# Patient Record
Sex: Female | Born: 1950 | Hispanic: No | Smoking: Never smoker
Health system: Southern US, Community
[De-identification: ages and names within clinical notes are randomized; demographics above are authoritative.]

## PROBLEM LIST (undated history)

## (undated) DIAGNOSIS — K219 Gastro-esophageal reflux disease without esophagitis: Secondary | ICD-10-CM

## (undated) DIAGNOSIS — R0600 Dyspnea, unspecified: Secondary | ICD-10-CM

## (undated) DIAGNOSIS — J40 Bronchitis, not specified as acute or chronic: Secondary | ICD-10-CM

## (undated) DIAGNOSIS — I10 Essential (primary) hypertension: Secondary | ICD-10-CM

## (undated) DIAGNOSIS — M1990 Unspecified osteoarthritis, unspecified site: Secondary | ICD-10-CM

## (undated) DIAGNOSIS — E119 Type 2 diabetes mellitus without complications: Secondary | ICD-10-CM

## (undated) DIAGNOSIS — R109 Unspecified abdominal pain: Secondary | ICD-10-CM

## (undated) DIAGNOSIS — E785 Hyperlipidemia, unspecified: Secondary | ICD-10-CM

## (undated) HISTORY — DX: Bronchitis, not specified as acute or chronic: J40

## (undated) HISTORY — DX: Unspecified osteoarthritis, unspecified site: M19.90

## (undated) HISTORY — DX: Unspecified abdominal pain: R10.9

## (undated) HISTORY — DX: Type 2 diabetes mellitus without complications: E11.9

## (undated) HISTORY — DX: Essential (primary) hypertension: I10

## (undated) HISTORY — DX: Hyperlipidemia, unspecified: E78.5

## (undated) HISTORY — PX: COLONOSCOPY: SHX174

## (undated) SURGERY — Surgical Case
Anesthesia: *Unknown

---

## 2015-09-13 ENCOUNTER — Other Ambulatory Visit (INDEPENDENT_AMBULATORY_CARE_PROVIDER_SITE_OTHER): Payer: Self-pay

## 2015-09-13 DIAGNOSIS — E785 Hyperlipidemia, unspecified: Secondary | ICD-10-CM

## 2015-09-13 DIAGNOSIS — I10 Essential (primary) hypertension: Secondary | ICD-10-CM

## 2015-09-14 LAB — COMPREHENSIVE METABOLIC PANEL
A/G RATIO: 1.4 (ref 1.1–2.5)
ALBUMIN: 4.3 g/dL (ref 3.6–4.8)
ALT: 9 IU/L (ref 0–32)
AST: 18 IU/L (ref 0–40)
Alkaline Phosphatase: 100 IU/L (ref 39–117)
BILIRUBIN TOTAL: 0.3 mg/dL (ref 0.0–1.2)
BUN / CREAT RATIO: 15 (ref 11–26)
BUN: 9 mg/dL (ref 8–27)
CALCIUM: 9.6 mg/dL (ref 8.7–10.3)
CO2: 27 mmol/L (ref 18–29)
Chloride: 97 mmol/L (ref 97–106)
Creatinine, Ser: 0.62 mg/dL (ref 0.57–1.00)
GFR, EST AFRICAN AMERICAN: 112 mL/min/{1.73_m2} (ref >59)
GFR, EST NON AFRICAN AMERICAN: 97 mL/min/{1.73_m2} (ref >59)
GLUCOSE: 124 mg/dL — AB (ref 65–99)
Globulin, Total: 3.1 g/dL (ref 1.5–4.5)
Potassium: 4.2 mmol/L (ref 3.5–5.2)
Sodium: 141 mmol/L (ref 136–144)
TOTAL PROTEIN: 7.4 g/dL (ref 6.0–8.5)

## 2015-09-14 LAB — LIPID PANEL W/O CHOL/HDL RATIO
Cholesterol, Total: 205 mg/dL — ABNORMAL HIGH (ref 100–199)
HDL: 64 mg/dL (ref >39)
LDL CALC: 128 mg/dL — AB (ref 0–99)
Triglycerides: 66 mg/dL (ref 0–149)
VLDL CHOLESTEROL CAL: 13 mg/dL (ref 5–40)

## 2015-09-30 ENCOUNTER — Ambulatory Visit (INDEPENDENT_AMBULATORY_CARE_PROVIDER_SITE_OTHER): Payer: Self-pay | Admitting: Internal Medicine

## 2015-09-30 ENCOUNTER — Encounter: Payer: Self-pay | Admitting: Internal Medicine

## 2015-09-30 VITALS — BP 110/68 | HR 60 | Resp 16 | Ht 68.0 in | Wt 153.0 lb

## 2015-09-30 DIAGNOSIS — S99921A Unspecified injury of right foot, initial encounter: Secondary | ICD-10-CM

## 2015-09-30 DIAGNOSIS — L810 Postinflammatory hyperpigmentation: Secondary | ICD-10-CM

## 2015-09-30 DIAGNOSIS — I10 Essential (primary) hypertension: Secondary | ICD-10-CM

## 2015-09-30 DIAGNOSIS — E119 Type 2 diabetes mellitus without complications: Secondary | ICD-10-CM

## 2015-09-30 DIAGNOSIS — E1169 Type 2 diabetes mellitus with other specified complication: Secondary | ICD-10-CM | POA: Insufficient documentation

## 2015-09-30 DIAGNOSIS — M25512 Pain in left shoulder: Secondary | ICD-10-CM

## 2015-09-30 DIAGNOSIS — E785 Hyperlipidemia, unspecified: Secondary | ICD-10-CM | POA: Insufficient documentation

## 2015-09-30 HISTORY — DX: Hyperlipidemia, unspecified: E78.5

## 2015-09-30 MED ORDER — AMLODIPINE BESYLATE 10 MG PO TABS: 10.0000 mg | ORAL_TABLET | Freq: Every day | ORAL | Status: DC

## 2015-09-30 MED ORDER — NAPROXEN SODIUM 220 MG PO TABS: ORAL_TABLET | ORAL | Status: DC

## 2015-09-30 MED ORDER — GLIMEPIRIDE 2 MG PO TABS: 2.0000 mg | ORAL_TABLET | Freq: Every day | ORAL | Status: DC

## 2015-09-30 MED ORDER — METFORMIN HCL 500 MG PO TABS: 500.0000 mg | ORAL_TABLET | Freq: Two times a day (BID) | ORAL | Status: DC

## 2015-09-30 MED ORDER — SIMVASTATIN 20 MG PO TABS: 20.0000 mg | ORAL_TABLET | Freq: Every day | ORAL | Status: DC

## 2015-09-30 NOTE — Patient Instructions (Signed)
Drink a glass of water before every meal Drink 6-8 glasses of water daily Eat three meals daily Eat a protein and healthy fat with every meal (eggs,fish, chicken, [place] and limit red meats) Eat 5 servings of vegetables daily, mix the colors Eat 2 servings of fruit daily with skin, if skin is edible Use smaller plates Put food/utensils down as you chew and swallow each bite Eat at a table with friends/family at least once daily, no TV Do not eat in front of the TV  Eggs, nuts, fish, chicken, [place] are good protein/fat combinations--just don't fry them.  Eat only one handful of nuts daily as they have a lot of calories  Stay away from red meats  Call for appt. When you return from [place]

## 2015-09-30 NOTE — Progress Notes (Signed)
Subjective:    Patient ID: Cheryl Hopkins, female    DOB: August 10, 1951, 64 y.o.   MRN: [id number]  HPI   1.  DM:  A1C in August/September was down to 7.1%.  Brings in sugars from beginning of October ranging from 51 before dinner to 177 before breakfast.  Majority in the 80s before breakfast, however.  The following week ranging fro 119-196 with last check 10/17.  Pt. States a visiting child dumped her test strips into the garbage and could not continue to check after mid October.  The family did not go back for a refill even in November.  2.  Hyperlipidemia:  Not at goal with total and LDL at 205 and 128, respectively, but much improved with Simvastatin start.  Discussed continuing to improve diet and physical activity  3.  Left shoulder pain for past week.  Reportedly has had in past and had had injected with good results.  Has taken Aleve 2 tabs once daily for this with good control of pain.  Hurts most with abduction of shoulder.  Denies recent injury or overuse.  4.  Stubbed right 4th toe last week.  Toe and distal foot are swollen and painful.    5.  Hyperpigmentation in skin below eyes.  Pt. Would like a cream or treatment to decrease hyperpigmentation.  Reportedly, had some swelling below eyes when first came to [place] of year that has since resolved.  Pt. Jumps from one topic to another today.  Difficulty getting focus. Pt. Has appt. Later today with dentist and an appt. In two days with Ophthalmology for diabetic eye exam. They have not yet heard about ENT for loss of hearing in left ear or CXR for bilateral dry crackles.    Review of Systems     Objective:   Physical Exam  NAD Skin on face with hypopigmentation of lower lids, with skin just below extending lateral to zygomatic arch to mid cheek with hyperpigmentation and mild deepening of skin folds.  Perinasolabial fold areas bilaterally with slight hypopigmentation as well.  No current swelling or rash.  Left  shoulder:  Full ROM, but significant discomfort to abduct from 90 degrees to 180 degrees. Passive ROM is much less painful.  Rest of ROM appears much less painful.  Tender over left trapezius to shoulder.  NT over subacromial bursa, AC, and CC joints.  Good hand grip bilaterally.  Right foot:  4th toe with swelling and tenderness.  No crepitation.  Tender over dorsal distal foot at base of toes 3-5 with some swelling here as well.        Assessment & Plan:  1.  DM:  Did not keep track of sugars as noted, but has shown much improved control.  2.  HYperlipidemia:  Improved with start of Simvastatin.  Leaving the country for [place] in weeks.  Will not be able to have follow up if increase Simvastatin now.  To work on diet and exercise as discussed.  3.  Left shoulder pain:  Not clear if this is a rotator cuff tendinitis or not.  To utilize Aleve once daily with food for next 2 weeks and then only as needed thereafter.  4.  Right 4th toe injury:  May be fractured.  Toe buddy taped to 3rd toe and to told to wear flat, firm soled shoe that is not open backed to prevent sliding of foot back and forth.  Elevate.  5.  Pigmentation changes of facial skin:  Asked  her to utilize Eucerin Cream to keep hydrated, also to apply 30 spf sunscreen when outdoors and avoid direct exposure to sun.  Discussed likely due to post inflammatory pigmentation changes and should improve with time.  36. Son brings up she will be leaving for [place] soon.  To pick up 4 months of prescriptions before leaving and will check into need for malaria prophylaxis and immunizations recommended for someone who is traveling back and forth, but has lived most of life in [place].

## 2015-10-07 ENCOUNTER — Telehealth: Payer: Self-pay | Admitting: Internal Medicine

## 2015-10-07 DIAGNOSIS — E119 Type 2 diabetes mellitus without complications: Secondary | ICD-10-CM

## 2015-10-07 MED ORDER — METFORMIN HCL ER 500 MG PO TB24: ORAL_TABLET | ORAL | Status: DC

## 2015-10-07 NOTE — Telephone Encounter (Signed)
Patient's son Horald Chestnut) [phone].  Caller was asked to notify physician by pharmacist to see if patient could take extended release tabs of metFORMIN (GLUCOPHAGE) 500 mg. Tab.  Patient complained that the Rx makes her stomach hurt.  Also patient's son stated that mother has not received Rx for cholesterol and blood pressure med.  Please call patients son and advise.  Pharmacy is [facility] on [address].

## 2015-10-07 NOTE — Telephone Encounter (Signed)
rx done son aware

## 2015-10-23 ENCOUNTER — Telehealth: Payer: Self-pay

## 2015-10-23 NOTE — Telephone Encounter (Signed)
Patient son called on yesterday 10/22/15 to check to see if Chest X-ray results were back. Results given to Dr. Amil Amen

## 2015-10-23 NOTE — Telephone Encounter (Signed)
Called Ismael, pt's son:

## 2015-10-23 NOTE — Telephone Encounter (Signed)
Called Ismael, pt's son.  Difficulty understanding him over phone with lots of background noise.  Discussed his mother's CXR likely collapsed lung(atelectasis) or changes from bronchiectasis, which sounds like what she suffered from in past years based on description of pulmonary toilet that caused her chest wall to hurt in past.   I also discussed malaria prophylaxis and need to know what her past immunizations to know what she needs before returning to [place]:  Meningococcus, Typhoid, Yellow Fever, Hepatitis A.  Set up for appt. Next Wednesday evening and he will make sure they bring her immunizations.

## 2015-10-30 ENCOUNTER — Ambulatory Visit (INDEPENDENT_AMBULATORY_CARE_PROVIDER_SITE_OTHER): Payer: Self-pay | Admitting: Internal Medicine

## 2015-10-30 ENCOUNTER — Encounter: Payer: Self-pay | Admitting: Internal Medicine

## 2015-10-30 VITALS — BP 120/68 | HR 73 | Ht 68.0 in | Wt 153.5 lb

## 2015-10-30 DIAGNOSIS — J984 Other disorders of lung: Secondary | ICD-10-CM

## 2015-10-30 DIAGNOSIS — Z7184 Encounter for health counseling related to travel: Secondary | ICD-10-CM

## 2015-10-30 DIAGNOSIS — Z7189 Other specified counseling: Secondary | ICD-10-CM

## 2015-10-30 DIAGNOSIS — I10 Essential (primary) hypertension: Secondary | ICD-10-CM

## 2015-10-30 DIAGNOSIS — J9801 Acute bronchospasm: Secondary | ICD-10-CM

## 2015-10-30 MED ORDER — ALBUTEROL SULFATE HFA 108 (90 BASE) MCG/ACT IN AERS
2.0000 | INHALATION_SPRAY | Freq: Four times a day (QID) | RESPIRATORY_TRACT | Status: DC | PRN
Start: 2015-10-30 — End: 2016-11-05

## 2015-10-30 NOTE — Patient Instructions (Signed)
Recommended vaccines  Hepatitis A:  2 shots 6 months apart.  May be less expensive to get in combination with Hepatitis B (Twinrix)  Check the public health department in [place] to see if free.  Typhoid--not required, but recommended.  $78 at the [place] [facility]  Malarone ( Atovaquone/Proguanil  250 mg/100 mg once daily for malaria prophylaxis.  Start 1-2 days before getting into [place] and every daty there.  Continue for 7 days after returning from [place].  You are good with Meningitis/Yellow Fever/Tetanus/MMR vaccines.    Would recommend one more Varicella vaccine. 

## 2015-10-30 NOTE — Progress Notes (Signed)
   Subjective:    Patient ID: Monia Sabal, female    DOB: 23-Dec-1950, 64 y.o.   MRN: [id number]  HPI  1.  Abnormal CXR:  Recent left lower lung infiltrate consistent with atelectasis.  Recommended to have follow up for resolution.   Pt. Had asthma when a child with chronic coughing.  2 years ago, suffered from pneumonia and sounds like developed a chronic process requiring aggressive pulmonary toilet.  Stopped going for the pulmonary toilet as she heard a "crack"  And developed chest discomfort.  Sounds like had a cough for over a month productive of thick sputum.   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  2.  Travel Immunization and Malaria prophylaxis review to return to [place] for 6 months.  They do have mosquito netting for sleep.  Never used Malaria prophylaxis when lived in [place], [place].      Review of Systems     Objective:   Physical Exam  NAD Lungs:  Bibasilar posterior dry crackles at bases.  Resonant to percussion over same area.  No wheeze CV:  RRR without murmur or rub.        Assessment & Plan:  1.  Left lower lung infiltrate consistent with atelectasis and findings of likely chronic bronchitis/airway inflammation. Pt. Leaving for 6 months to [place].  She is exposed to air pollution in [place].  Has a mask she wears after sandstorms weekly.   Rx for Albuterol HFA to use as needed.   Her daughter is a pediatrician and can have her reassessed while in [place] if she feels chest tightness and wheezing.  Consider addition of inhaled corticosteroids if they find she is wheezing and needing Albuterol more than once weekly. When returns to [place]. In 6 months, recommend making appt. To repeat CXR.  2.  Travel immunizations and prophylaxis:  After review of her immunizations, recommend the following with PHD or once gets to [place]:  Environmental education officer and netting for sleep.  PHD recommended Malarone for malaria prophylaxis.   Discussed with pt and son $7/pill.  She should start it 1-2 days before arrival in [place], continue daily while there and continue for 7 days after return to [place]. Hepatitis A vaccine  $138 per shot (Twinrix here)  Can check with PHD in [place]  [employer] to see if still no charge there. Would ultimately complete Hep B vaccination over next year if gets started with Twinrix.   Typhoid $78 at [facility] here. She has been immunized for Meningococcemia, Yellow Fever, Td, Varicella (though recommend a second immunization when get to [place]), MMR

## 2015-10-31 DIAGNOSIS — I152 Hypertension secondary to endocrine disorders: Secondary | ICD-10-CM | POA: Insufficient documentation

## 2015-10-31 DIAGNOSIS — J984 Other disorders of lung: Secondary | ICD-10-CM | POA: Insufficient documentation

## 2015-10-31 DIAGNOSIS — I10 Essential (primary) hypertension: Secondary | ICD-10-CM | POA: Insufficient documentation

## 2016-08-25 ENCOUNTER — Other Ambulatory Visit: Payer: Self-pay | Admitting: Internal Medicine

## 2016-08-25 DIAGNOSIS — E119 Type 2 diabetes mellitus without complications: Secondary | ICD-10-CM

## 2016-09-08 ENCOUNTER — Other Ambulatory Visit: Payer: Self-pay | Admitting: Internal Medicine

## 2016-09-08 DIAGNOSIS — E782 Mixed hyperlipidemia: Secondary | ICD-10-CM

## 2016-09-08 MED ORDER — SIMVASTATIN 20 MG PO TABS: 20.0000 mg | ORAL_TABLET | Freq: Every day | ORAL | 1 refills | Status: DC

## 2016-09-09 NOTE — Telephone Encounter (Signed)
Patient's family informed.  Apt. On Jan. 3 at 6:00 p.

## 2016-11-04 ENCOUNTER — Telehealth: Payer: Self-pay | Admitting: Internal Medicine

## 2016-11-04 ENCOUNTER — Ambulatory Visit: Payer: Self-pay | Admitting: Internal Medicine

## 2016-11-05 ENCOUNTER — Ambulatory Visit (INDEPENDENT_AMBULATORY_CARE_PROVIDER_SITE_OTHER): Payer: BLUE CROSS/BLUE SHIELD | Admitting: Physician Assistant

## 2016-11-05 VITALS — BP 118/80 | HR 72 | Temp 98.6°F | Resp 18 | Ht 68.0 in | Wt 162.4 lb

## 2016-11-05 DIAGNOSIS — Z01419 Encounter for gynecological examination (general) (routine) without abnormal findings: Secondary | ICD-10-CM | POA: Diagnosis not present

## 2016-11-05 DIAGNOSIS — G8929 Other chronic pain: Secondary | ICD-10-CM

## 2016-11-05 DIAGNOSIS — E119 Type 2 diabetes mellitus without complications: Secondary | ICD-10-CM

## 2016-11-05 DIAGNOSIS — Z1231 Encounter for screening mammogram for malignant neoplasm of breast: Secondary | ICD-10-CM | POA: Diagnosis not present

## 2016-11-05 DIAGNOSIS — M25562 Pain in left knee: Secondary | ICD-10-CM

## 2016-11-05 DIAGNOSIS — Z114 Encounter for screening for human immunodeficiency virus [HIV]: Secondary | ICD-10-CM | POA: Diagnosis not present

## 2016-11-05 DIAGNOSIS — Z0000 Encounter for general adult medical examination without abnormal findings: Secondary | ICD-10-CM | POA: Diagnosis not present

## 2016-11-05 DIAGNOSIS — Z1329 Encounter for screening for other suspected endocrine disorder: Secondary | ICD-10-CM | POA: Diagnosis not present

## 2016-11-05 DIAGNOSIS — I10 Essential (primary) hypertension: Secondary | ICD-10-CM | POA: Diagnosis not present

## 2016-11-05 DIAGNOSIS — Z1159 Encounter for screening for other viral diseases: Secondary | ICD-10-CM

## 2016-11-05 DIAGNOSIS — E7800 Pure hypercholesterolemia, unspecified: Secondary | ICD-10-CM | POA: Diagnosis not present

## 2016-11-05 DIAGNOSIS — Z23 Encounter for immunization: Secondary | ICD-10-CM

## 2016-11-05 DIAGNOSIS — M25561 Pain in right knee: Secondary | ICD-10-CM | POA: Diagnosis not present

## 2016-11-05 DIAGNOSIS — Z1211 Encounter for screening for malignant neoplasm of colon: Secondary | ICD-10-CM | POA: Diagnosis not present

## 2016-11-05 MED ORDER — ZOSTER VACCINE LIVE 19400 UNT/0.65ML ~~LOC~~ SUSR: 0.6500 mL | Freq: Once | SUBCUTANEOUS | 0 refills | Status: AC

## 2016-11-05 MED ORDER — AMLODIPINE BESYLATE 10 MG PO TABS: 10.0000 mg | ORAL_TABLET | Freq: Every day | ORAL | 3 refills | Status: DC

## 2016-11-05 MED ORDER — MELOXICAM 7.5 MG PO TABS: 7.5000 mg | ORAL_TABLET | Freq: Every day | ORAL | 0 refills | Status: DC

## 2016-11-05 NOTE — Progress Notes (Signed)
Cheryl Hopkins  MRN: [id number] DOB: Jan 05, 1951  Subjective:  Pt presents to clinic for a CPE.  She has not had medical care for over a year.  She has been taking her medications for her chronic medical problems but is going to be out in a few weeks.  She would like to change her care to here.   Last dental exam:  Every 6 months  Last vision exam: last year Last pap smear: long time ago Last mammogram: long time ago Last colonoscopy: never Vaccinations - UTD  Exercise: no Diet: tries to eat healthly  Son interprets per patient request   Patient Active Problem List   Diagnosis Date Noted  . Chronic lung disease 10/31/2015  . Hypertension 10/31/2015  . Diabetes mellitus type II, controlled ([place]) 09/30/2015  . Hyperlipidemia 09/30/2015    Current Outpatient Prescriptions on File Prior to Visit  Medication Sig Dispense Refill  . amLODipine (NORVASC) 10 MG tablet Take 1 tablet (10 mg total) by mouth daily. 30 tablet 11  . glimepiride (AMARYL) 2 MG tablet TAKE ONE TABLET BY MOUTH ONCE DAILY WITH  BREAKFAST 60 tablet 1  . metFORMIN (GLUCOPHAGE-XR) 500 MG 24 hr tablet TAKE ONE TABLET BY MOUTH TWICE DAILY WITH MEALS 60 tablet 1  . simvastatin (ZOCOR) 20 MG tablet Take 1 tablet (20 mg total) by mouth daily. Take with evening meal 30 tablet 1  . naproxen sodium (ALEVE) 220 MG tablet 2 tabs once daily with meal for 2 weeks, then once daily with meal as needed for pain     No current facility-administered medications on file prior to visit.     No Known Allergies  Social History   Social History  . Marital status: Widowed    Spouse name: Widowed since 2010  . Number of children: 74  . Years of education: <8   Occupational History  . Housewife    Social History Main Topics  . Smoking status: Never Smoker  . Smokeless tobacco: Never Used     Comment: Chews cola nut  . Alcohol use No  . Drug use: No  . Sexual activity: Not Currently   Other Topics Concern  . Not on file    Social History Narrative      Widow   Originally from [place] - Came to [place]. In 2016   Lives with son and his wife and their 3 children.        Exercise - no exercise      Seatbelt - 100%   Gun in home - no    No past surgical history on file.  Family History  Problem Relation Age of Onset  . Bronchitis Sister   . Hepatitis Daughter   . Hypertension Sister   . Stroke Sister     Review of Systems  Constitutional: Negative.   HENT: Negative.   Eyes: Negative.   Respiratory: Negative.   Cardiovascular: Negative.   Gastrointestinal: Negative.   Endocrine: Negative.   Genitourinary: Negative.   Musculoskeletal: Negative.        Knees hurt - relafen helps - back of knees feel tight  Skin: Negative.   Allergic/Immunologic: Negative.   Neurological: Negative.   Hematological: Negative.   Psychiatric/Behavioral: Negative.     Objective:  BP 118/80   Pulse 72   Temp 98.6 F (37 C) (Oral)   Resp 18   Ht _0  (1.727 m)   Wt 162 lb 6.4 oz (73.7 kg)   LMP 11/02/2010 (Approximate)  Comment: postmenopausal  SpO2 96%   BMI 24.69 kg/m   Physical Exam  Constitutional: She is oriented to person, place, and time and well-developed, well-nourished, and in no distress.  HENT:  Head: Normocephalic and atraumatic.  Right Ear: Hearing, tympanic membrane, external ear and ear canal normal.  Left Ear: Hearing, tympanic membrane, external ear and ear canal normal.  Nose: Nose normal.  Mouth/Throat: Uvula is midline, oropharynx is clear and moist and mucous membranes are normal.  Eyes: Conjunctivae and EOM are normal. Pupils are equal, round, and reactive to light.  Neck: Trachea normal and normal range of motion. Neck supple. No thyroid mass and no thyromegaly present.  Cardiovascular: Normal rate, regular rhythm and normal heart sounds.   No murmur heard. Pulmonary/Chest: Effort normal and breath sounds normal. She has no wheezes. Right breast exhibits no inverted nipple, no  mass, no nipple discharge, no skin change and no tenderness. Left breast exhibits no inverted nipple, no mass, no nipple discharge, no skin change and no tenderness. Breasts are symmetrical.  Abdominal: Soft. Bowel sounds are normal. There is no tenderness.  Genitourinary: Vagina normal, uterus normal, right adnexa normal, left adnexa normal and vulva normal. Rectal exam shows no external hemorrhoid and no mass.  Genitourinary Comments: Transformation zone present  Musculoskeletal: Normal range of motion.       Right knee: Normal.       Left knee: Tenderness (at hamstring attachement) found.  Lymphadenopathy:    She has no cervical adenopathy.  Neurological: She is alert and oriented to person, place, and time. She has normal motor skills, normal sensation, normal strength and normal reflexes. Gait normal.  Skin: Skin is warm and dry.  Psychiatric: Mood, memory, affect and judgment normal.    Visual Acuity Screening   Right eye Left eye Both eyes  Without correction: _0  With correction:       Assessment and Plan :  Annual physical exam - anticipatory guidance  Needs flu shot - Plan: Flu Vaccine QUAD 36+ mos IM  Need for shingles vaccine - Plan: Zoster Vaccine Live, PF, (ZOSTAVAX) 62376 UNT/0.65ML injection, CANCELED: Varicella-zoster vaccine subcutaneous  Encounter for screening for HIV - Plan: HIV antibody  Need for hepatitis C screening test - Plan: HCV Antibody RFX to Quant PCR  Need for hepatitis B screening test - Plan: Hepatitis B surface antibody  Controlled type 2 diabetes mellitus without complication, without long-term current use of insulin (HCC) - Plan: CMP14+EGFR, Hemoglobin A1c, Microalbumin, urine - wait for labs and adjust medications as indicated  Encounter for gynecological examination without abnormal finding - Plan: Pap IG, CT/NG NAA, and HPV (high risk)  Screening for thyroid disorder - Plan: TSH  Essential hypertension - controlled on  medications - will refill  Pure hypercholesterolemia - Plan: Lipid panel - wait for labs for medication determination  Screen for colon cancer - Plan: Ambulatory referral to Gastroenterology  Screening mammogram, encounter for - Plan: MM Digital Screening  Chronic pain of both knees - Plan: meloxicam (MOBIC) 7.5 MG tablet - trial of mobic as the patient would like to only take medications once a day - suggested stretching because hamstring feels tight - she declined PT today  Windell Hummingbird PA-C  [facility] 11/05/2016 3:04 PM

## 2016-11-05 NOTE — Patient Instructions (Addendum)
Health Maintenance, Female Introduction Adopting a healthy lifestyle and getting preventive care can go a long way to promote health and wellness. Talk with your health care provider about what schedule of regular examinations is right for you. This is a good chance for you to check in with your provider about disease prevention and staying healthy. In between checkups, there are plenty of things you can do on your own. Experts have done a lot of research about which lifestyle changes and preventive measures are most likely to keep you healthy. Ask your health care provider for more information. Weight and diet Eat a healthy diet  Be sure to include plenty of vegetables, fruits, low-fat dairy products, and lean protein.  Do not eat a lot of foods high in solid fats, added sugars, or salt.  Get regular exercise. This is one of the most important things you can do for your health.  Most adults should exercise for at least 150 minutes each week. The exercise should increase your heart rate and make you sweat (moderate-intensity exercise).  Most adults should also do strengthening exercises at least twice a week. This is in addition to the moderate-intensity exercise. Maintain a healthy weight  Body mass index (BMI) is a measurement that can be used to identify possible weight problems. It estimates body fat based on height and weight. Your health care provider can help determine your BMI and help you achieve or maintain a healthy weight.  For females 4 years of age and older:  A BMI below 18.5 is considered underweight.  A BMI of 18.5 to 24.9 is normal.  A BMI of 25 to 29.9 is considered overweight.  A BMI of 30 and above is considered obese. Watch levels of cholesterol and blood lipids  You should start having your blood tested for lipids and cholesterol at 64 years of age, then have this test every 5 years.  You may need to have your cholesterol levels checked more often  if:  Your lipid or cholesterol levels are high.  You are older than 64 years of age.  You are at high risk for heart disease. Cancer screening Lung Cancer  Lung cancer screening is recommended for adults 12-31 years old who are at high risk for lung cancer because of a history of smoking.  A yearly low-dose CT scan of the lungs is recommended for people who:  Currently smoke.  Have quit within the past 15 years.  Have at least a 30-pack-year history of smoking. A pack year is smoking an average of one pack of cigarettes a day for 1 year.  Yearly screening should continue until it has been 15 years since you quit.  Yearly screening should stop if you develop a health problem that would prevent you from having lung cancer treatment. Breast Cancer  Practice breast self-awareness. This means understanding how your breasts normally appear and feel.  It also means doing regular breast self-exams. Let your health care provider know about any changes, no matter how small.  If you are in your 20s or 30s, you should have a clinical breast exam (CBE) by a health care provider every 1-3 years as part of a regular health exam.  If you are 74 or older, have a CBE every year. Also consider having a breast X-ray (mammogram) every year.  If you have a family history of breast cancer, talk to your health care provider about genetic screening.  If you are at high risk for breast cancer,  talk to your health care provider about having an MRI and a mammogram every year.  Breast cancer gene (BRCA) assessment is recommended for women who have family members with BRCA-related cancers. BRCA-related cancers include:  Breast.  Ovarian.  Tubal.  Peritoneal cancers.  Results of the assessment will determine the need for genetic counseling and BRCA1 and BRCA2 testing. Cervical Cancer  Your health care provider may recommend that you be screened regularly for cancer of the pelvic organs (ovaries,  uterus, and vagina). This screening involves a pelvic examination, including checking for microscopic changes to the surface of your cervix (Pap test). You may be encouraged to have this screening done every 3 years, beginning at age 21.  For women ages 30-65, health care providers may recommend pelvic exams and Pap testing every 3 years, or they may recommend the Pap and pelvic exam, combined with testing for human papilloma virus (HPV), every 5 years. Some types of HPV increase your risk of cervical cancer. Testing for HPV may also be done on women of any age with unclear Pap test results.  Other health care providers may not recommend any screening for nonpregnant women who are considered low risk for pelvic cancer and who do not have symptoms. Ask your health care provider if a screening pelvic exam is right for you.  If you have had past treatment for cervical cancer or a condition that could lead to cancer, you need Pap tests and screening for cancer for at least 20 years after your treatment. If Pap tests have been discontinued, your risk factors (such as having a new sexual partner) need to be reassessed to determine if screening should resume. Some women have medical problems that increase the chance of getting cervical cancer. In these cases, your health care provider may recommend more frequent screening and Pap tests. Colorectal Cancer  This type of cancer can be detected and often prevented.  Routine colorectal cancer screening usually begins at 64 years of age and continues through 64 years of age.  Your health care provider may recommend screening at an earlier age if you have risk factors for colon cancer.  Your health care provider may also recommend using home test kits to check for hidden blood in the stool.  A small camera at the end of a tube can be used to examine your colon directly (sigmoidoscopy or colonoscopy). This is done to check for the earliest forms of colorectal  cancer.  Routine screening usually begins at age 50.  Direct examination of the colon should be repeated every 5-10 years through 64 years of age. However, you may need to be screened more often if early forms of precancerous polyps or small growths are found. Skin Cancer  Check your skin from head to toe regularly.  Tell your health care provider about any new moles or changes in moles, especially if there is a change in a mole's shape or color.  Also tell your health care provider if you have a mole that is larger than the size of a pencil eraser.  Always use sunscreen. Apply sunscreen liberally and repeatedly throughout the day.  Protect yourself by wearing long sleeves, pants, a wide-brimmed hat, and sunglasses whenever you are outside. Heart disease, diabetes, and high blood pressure  High blood pressure causes heart disease and increases the risk of stroke. High blood pressure is more likely to develop in:  People who have blood pressure in the high end of the normal range (130-139/85-89 mm Hg).    People who are overweight or obese.  People who are African American.  If you are 18-39 years of age, have your blood pressure checked every 3-5 years. If you are 40 years of age or older, have your blood pressure checked every year. You should have your blood pressure measured twice-once when you are at a hospital or clinic, and once when you are not at a hospital or clinic. Record the average of the two measurements. To check your blood pressure when you are not at a hospital or clinic, you can use:  An automated blood pressure machine at a pharmacy.  A home blood pressure monitor.  If you are between 55 years and 79 years old, ask your health care provider if you should take aspirin to prevent strokes.  Have regular diabetes screenings. This involves taking a blood sample to check your fasting blood sugar level.  If you are at a normal weight and have a low risk for diabetes,  have this test once every three years after 64 years of age.  If you are overweight and have a high risk for diabetes, consider being tested at a younger age or more often. Preventing infection Hepatitis B  If you have a higher risk for hepatitis B, you should be screened for this virus. You are considered at high risk for hepatitis B if:  You were born in a country where hepatitis B is common. Ask your health care provider which countries are considered high risk.  Your parents were born in a high-risk country, and you have not been immunized against hepatitis B (hepatitis B vaccine).  You have HIV or AIDS.  You use needles to inject street drugs.  You live with someone who has hepatitis B.  You have had sex with someone who has hepatitis B.  You get hemodialysis treatment.  You take certain medicines for conditions, including cancer, organ transplantation, and autoimmune conditions. Hepatitis C  Blood testing is recommended for:  Everyone born from 1945 through 1965.  Anyone with known risk factors for hepatitis C. Sexually transmitted infections (STIs)  You should be screened for sexually transmitted infections (STIs) including gonorrhea and chlamydia if:  You are sexually active and are younger than 64 years of age.  You are older than 64 years of age and your health care provider tells you that you are at risk for this type of infection.  Your sexual activity has changed since you were last screened and you are at an increased risk for chlamydia or gonorrhea. Ask your health care provider if you are at risk.  If you do not have HIV, but are at risk, it may be recommended that you take a prescription medicine daily to prevent HIV infection. This is called pre-exposure prophylaxis (PrEP). You are considered at risk if:  You are sexually active and do not regularly use condoms or know the HIV status of your partner(s).  You take drugs by injection.  You are sexually  active with a partner who has HIV. Talk with your health care provider about whether you are at high risk of being infected with HIV. If you choose to begin PrEP, you should first be tested for HIV. You should then be tested every 3 months for as long as you are taking PrEP. Pregnancy  If you are premenopausal and you may become pregnant, ask your health care provider about preconception counseling.  If you may become pregnant, take 400 to 800 micrograms (mcg) of folic acid   every day.  If you want to prevent pregnancy, talk to your health care provider about birth control (contraception). Osteoporosis and menopause  Osteoporosis is a disease in which the bones lose minerals and strength with aging. This can result in serious bone fractures. Your risk for osteoporosis can be identified using a bone density scan.  If you are 65 years of age or older, or if you are at risk for osteoporosis and fractures, ask your health care provider if you should be screened.  Ask your health care provider whether you should take a calcium or vitamin D supplement to lower your risk for osteoporosis.  Menopause may have certain physical symptoms and risks.  Hormone replacement therapy may reduce some of these symptoms and risks. Talk to your health care provider about whether hormone replacement therapy is right for you. Follow these instructions at home:  Schedule regular health, dental, and eye exams.  Stay current with your immunizations.  Do not use any tobacco products including cigarettes, chewing tobacco, or electronic cigarettes.  If you are pregnant, do not drink alcohol.  If you are breastfeeding, limit how much and how often you drink alcohol.  Limit alcohol intake to no more than 1 drink per day for nonpregnant women. One drink equals 12 ounces of beer, 5 ounces of wine, or 1 ounces of hard liquor.  Do not use street drugs.  Do not share needles.  Ask your health care provider for  help if you need support or information about quitting drugs.  Tell your health care provider if you often feel depressed.  Tell your health care provider if you have ever been abused or do not feel safe at home. This information is not intended to replace advice given to you by your health care provider. Make sure you discuss any questions you have with your health care provider. Document Released: 05/04/2011 Document Revised: 03/26/2016 Document Reviewed: 07/23/2015  2017 Elsevier    IF you received an x-ray today, you will receive an invoice from [facility]. Please contact [facility] at [phone] with questions or concerns regarding your invoice.   IF you received labwork today, you will receive an invoice from [facility]. Please contact [facility] at [phone] with questions or concerns regarding your invoice.   Our billing staff will not be able to assist you with questions regarding bills from these companies.  You will be contacted with the lab results as soon as they are available. The fastest way to get your results is to activate your My Chart account. Instructions are located on the last page of this paperwork. If you have not heard from us regarding the results in 2 weeks, please contact this office.      

## 2016-11-06 ENCOUNTER — Other Ambulatory Visit: Payer: Self-pay

## 2016-11-06 ENCOUNTER — Ambulatory Visit: Payer: Self-pay | Admitting: Internal Medicine

## 2016-11-06 DIAGNOSIS — E782 Mixed hyperlipidemia: Secondary | ICD-10-CM

## 2016-11-06 DIAGNOSIS — E119 Type 2 diabetes mellitus without complications: Secondary | ICD-10-CM

## 2016-11-06 LAB — CMP14+EGFR
A/G RATIO: 1.5 (ref 1.2–2.2)
ALBUMIN: 4.6 g/dL (ref 3.6–4.8)
ALT: 11 IU/L (ref 0–32)
AST: 13 IU/L (ref 0–40)
Alkaline Phosphatase: 93 IU/L (ref 39–117)
BUN/Creatinine Ratio: 17 (ref 12–28)
BUN: 9 mg/dL (ref 8–27)
Bilirubin Total: 0.2 mg/dL (ref 0.0–1.2)
CALCIUM: 9.7 mg/dL (ref 8.7–10.3)
CO2: 27 mmol/L (ref 18–29)
Chloride: 97 mmol/L (ref 96–106)
Creatinine, Ser: 0.54 mg/dL — ABNORMAL LOW (ref 0.57–1.00)
GFR calc non Af Amer: 101 mL/min/{1.73_m2} (ref >59)
GFR, EST AFRICAN AMERICAN: 116 mL/min/{1.73_m2} (ref >59)
Globulin, Total: 3 g/dL (ref 1.5–4.5)
Glucose: 137 mg/dL — ABNORMAL HIGH (ref 65–99)
POTASSIUM: 4.1 mmol/L (ref 3.5–5.2)
Sodium: 142 mmol/L (ref 134–144)
Total Protein: 7.6 g/dL (ref 6.0–8.5)

## 2016-11-06 LAB — HCV AB W REFLEX TO QUANT PCR: HCV Ab: <0.1 s/co ratio (ref 0.0–0.9)

## 2016-11-06 LAB — LIPID PANEL
CHOLESTEROL TOTAL: 340 mg/dL — AB (ref 100–199)
Chol/HDL Ratio: 5.2 ratio units — ABNORMAL HIGH (ref 0.0–4.4)
HDL: 65 mg/dL (ref >39)
LDL Calculated: 252 mg/dL — ABNORMAL HIGH (ref 0–99)
Triglycerides: 115 mg/dL (ref 0–149)
VLDL CHOLESTEROL CAL: 23 mg/dL (ref 5–40)

## 2016-11-06 LAB — MICROALBUMIN, URINE: Microalbumin, Urine: 3.7 ug/mL

## 2016-11-06 LAB — TSH: TSH: 0.96 u[IU]/mL (ref 0.450–4.500)

## 2016-11-06 LAB — HEPATITIS B SURFACE ANTIBODY, QUANTITATIVE: Hepatitis B Surf Ab Quant: 744.9 m[IU]/mL (ref >9.9)

## 2016-11-06 LAB — HCV INTERPRETATION

## 2016-11-06 LAB — HEMOGLOBIN A1C
Est. average glucose Bld gHb Est-mCnc: 157 mg/dL
Hgb A1c MFr Bld: 7.1 % — ABNORMAL HIGH (ref 4.8–5.6)

## 2016-11-06 LAB — HIV ANTIBODY (ROUTINE TESTING W REFLEX): HIV SCREEN 4TH GENERATION: NONREACTIVE

## 2016-11-06 MED ORDER — GLIMEPIRIDE 2 MG PO TABS: 2.0000 mg | ORAL_TABLET | Freq: Every day | ORAL | 11 refills | Status: DC

## 2016-11-06 MED ORDER — SIMVASTATIN 20 MG PO TABS: 20.0000 mg | ORAL_TABLET | Freq: Every day | ORAL | 11 refills | Status: DC

## 2016-11-06 MED ORDER — METFORMIN HCL ER 500 MG PO TB24: 500.0000 mg | ORAL_TABLET | Freq: Two times a day (BID) | ORAL | 11 refills | Status: DC

## 2016-11-06 NOTE — Telephone Encounter (Signed)
Pat followed up as to cancellation of appointment.  Son Enis Gash took patient to Urgent Care because mom did not have [employer].  Pat informed son that patient  can come here, as Dr. Amil Amen is patient PCP.  Patient's son did not understand that (due to no [employer]).  Patient will have BCBS, Pat informed patient son we accept BCBS and he will continue to bring patient here to [employer].  Patient needs refills of diabetic Rx's.  This message for refill will be sent to nurse.  Fraser Din also discussed bringing patient to ESL classes as son would like patient to have help with [place].   Asked him to discuss more with Dr. Amil Amen.Marland Kitchen

## 2016-11-06 NOTE — Telephone Encounter (Signed)
Rx's faxed to [facility] [address].

## 2016-11-08 LAB — PAP IG, CT-NG NAA, HPV HIGH-RISK
Chlamydia, Nuc. Acid Amp: NEGATIVE
Gonococcus by Nucleic Acid Amp: NEGATIVE
HPV, high-risk: NEGATIVE
PAP Smear Comment: 0

## 2016-11-10 MED ORDER — ROSUVASTATIN CALCIUM 20 MG PO TABS: 20.0000 mg | ORAL_TABLET | Freq: Every day | ORAL | 0 refills | Status: DC

## 2016-11-10 NOTE — Addendum Note (Signed)
Addended by: Mancel Bale on: 11/10/2016 03:18 PM   Modules accepted: Orders

## 2016-11-13 ENCOUNTER — Encounter: Payer: Self-pay | Admitting: *Deleted

## 2016-11-26 ENCOUNTER — Encounter: Payer: Self-pay | Admitting: Gastroenterology

## 2016-11-26 ENCOUNTER — Telehealth: Payer: Self-pay

## 2016-11-26 NOTE — Telephone Encounter (Signed)
Patients son called in wanting to discuss lab results with [facility]. He would like to be called back as soon as possible.  His number is [phone]

## 2016-11-27 NOTE — Telephone Encounter (Signed)
Please advise 

## 2016-11-27 NOTE — Telephone Encounter (Signed)
Please call the patient and see if you are able to help him.  I wrote a letter with some lab explanation and if he has a question that you are unable to answer let me know.

## 2016-11-28 NOTE — Telephone Encounter (Signed)
Left message for call back.

## 2017-01-01 ENCOUNTER — Other Ambulatory Visit: Payer: Self-pay | Admitting: Physician Assistant

## 2017-01-01 DIAGNOSIS — I10 Essential (primary) hypertension: Secondary | ICD-10-CM

## 2017-01-07 ENCOUNTER — Ambulatory Visit (AMBULATORY_SURGERY_CENTER): Payer: Self-pay

## 2017-01-07 VITALS — Ht 69.0 in | Wt 166.8 lb

## 2017-01-07 DIAGNOSIS — Z1211 Encounter for screening for malignant neoplasm of colon: Secondary | ICD-10-CM

## 2017-01-07 MED ORDER — ZOSTER VACCINE LIVE 19400 UNT/0.65ML ~~LOC~~ SUSR: 0.6500 mL | Freq: Once | SUBCUTANEOUS | 0 refills | Status: AC

## 2017-01-07 MED ORDER — SUPREP BOWEL PREP KIT 17.5-3.13-1.6 GM/177ML PO SOLN: 1.0000 | Freq: Once | ORAL | 0 refills | Status: AC

## 2017-01-07 NOTE — Progress Notes (Signed)
No allergies to eggs or soy No diet meds No home oxygen No past problems with anesthesia   

## 2017-01-07 NOTE — Addendum Note (Signed)
Addended by: Virgia Land on: 01/07/2017 03:15 PM   Modules accepted: Orders

## 2017-01-12 ENCOUNTER — Encounter: Payer: Self-pay | Admitting: Gastroenterology

## 2017-01-21 ENCOUNTER — Ambulatory Visit (AMBULATORY_SURGERY_CENTER): Payer: BLUE CROSS/BLUE SHIELD | Admitting: Gastroenterology

## 2017-01-21 ENCOUNTER — Encounter: Payer: Self-pay | Admitting: Gastroenterology

## 2017-01-21 VITALS — BP 116/60 | HR 63 | Temp 96.4°F | Resp 14 | Ht 69.0 in | Wt 166.0 lb

## 2017-01-21 DIAGNOSIS — Z1211 Encounter for screening for malignant neoplasm of colon: Secondary | ICD-10-CM | POA: Diagnosis not present

## 2017-01-21 DIAGNOSIS — D123 Benign neoplasm of transverse colon: Secondary | ICD-10-CM

## 2017-01-21 DIAGNOSIS — Z1212 Encounter for screening for malignant neoplasm of rectum: Secondary | ICD-10-CM | POA: Diagnosis not present

## 2017-01-21 DIAGNOSIS — D122 Benign neoplasm of ascending colon: Secondary | ICD-10-CM | POA: Diagnosis not present

## 2017-01-21 MED ORDER — SODIUM CHLORIDE 0.9 % IV SOLN: 500.0000 mL | INTRAVENOUS | Status: DC

## 2017-01-21 NOTE — Progress Notes (Signed)
To PACU, vss patent aw report to rn 

## 2017-01-21 NOTE — Progress Notes (Signed)
Called to room to assist during endoscopic procedure.  Patient ID and intended procedure confirmed with present staff. Received instructions for my participation in the procedure from the performing physician.  

## 2017-01-21 NOTE — Patient Instructions (Signed)
Impressions/recommendations:  Polyps (handout given) No aspirin, ibuprofen, naproxen or other non-steroidal anti-inflammatory drugs for 7 days. May resume March 29th if needed. Until then Tylenol only.  Repeat colonoscopy pending pathology results.  YOU HAD AN ENDOSCOPIC PROCEDURE TODAY AT [facility]:   Refer to the procedure report that was given to you for any specific questions about what was found during the examination.  If the procedure report does not answer your questions, please call your gastroenterologist to clarify.  If you requested that your care partner not be given the details of your procedure findings, then the procedure report has been included in a sealed envelope for you to review at your convenience later.  YOU SHOULD EXPECT: Some feelings of bloating in the abdomen. Passage of more gas than usual.  Walking can help get rid of the air that was put into your GI tract during the procedure and reduce the bloating. If you had a lower endoscopy (such as a colonoscopy or flexible sigmoidoscopy) you may notice spotting of blood in your stool or on the toilet paper. If you underwent a bowel prep for your procedure, you may not have a normal bowel movement for a few days.  Please Note:  You might notice some irritation and congestion in your nose or some drainage.  This is from the oxygen used during your procedure.  There is no need for concern and it should clear up in a day or so.  SYMPTOMS TO REPORT IMMEDIATELY:   Following lower endoscopy (colonoscopy or flexible sigmoidoscopy):  Excessive amounts of blood in the stool  Significant tenderness or worsening of abdominal pains  Swelling of the abdomen that is new, acute  Fever of 100F or higher  For urgent or emergent issues, a gastroenterologist can be reached at any hour by calling [phone].   DIET:  We do recommend a small meal at first, but then you may proceed to your regular diet.  Drink plenty  of fluids but you should avoid alcoholic beverages for 24 hours.  ACTIVITY:  You should plan to take it easy for the rest of today and you should NOT DRIVE or use heavy machinery until tomorrow (because of the sedation medicines used during the test).    FOLLOW UP: Our staff will call the number listed on your records the next business day following your procedure to check on you and address any questions or concerns that you may have regarding the information given to you following your procedure. If we do not reach you, we will leave a message.  However, if you are feeling well and you are not experiencing any problems, there is no need to return our call.  We will assume that you have returned to your regular daily activities without incident.  If any biopsies were taken you will be contacted by phone or by letter within the next 1-3 weeks.  Please call us at [phone] if you have not heard about the biopsies in 3 weeks.    SIGNATURES/CONFIDENTIALITY: You and/or your care partner have signed paperwork which will be entered into your electronic medical record.  These signatures attest to the fact that that the information above on your After Visit Summary has been reviewed and is understood.  Full responsibility of the confidentiality of this discharge information lies with you and/or your care-partner.

## 2017-01-21 NOTE — Progress Notes (Signed)
Pt's states no medical or surgical changes since previsit or office visit. 

## 2017-01-21 NOTE — Op Note (Signed)
[facility] Patient Name: Cheryl Hopkins Procedure Date: 01/21/2017 9:15 AM MRN: [id number] Endoscopist: Mallie Mussel L. Loletha Carrow , MD Age: 66 Referring MD:  Date of Birth: 07-Nov-1950 Gender: Female Account #: [id number] Procedure:                Colonoscopy Indications:              Screening for colorectal malignant neoplasm, This                            is the patient's first colonoscopy Medicines:                Monitored Anesthesia Care Procedure:                Pre-Anesthesia Assessment:                           - Prior to the procedure, a History and Physical                            was performed, and patient medications and                            allergies were reviewed. The patient's tolerance of                            previous anesthesia was also reviewed. The risks                            and benefits of the procedure and the sedation                            options and risks were discussed with the patient.                            All questions were answered, and informed consent                            was obtained. Prior Anticoagulants: The patient has                            taken no previous anticoagulant or antiplatelet                            agents. ASA Grade Assessment: II - A patient with                            mild systemic disease. After reviewing the risks                            and benefits, the patient was deemed in                            satisfactory condition to undergo the procedure.  After obtaining informed consent, the colonoscope                            was passed under direct vision. Throughout the                            procedure, the patient's blood pressure, pulse, and                            oxygen saturations were monitored continuously. The                            Colonoscope was introduced through the anus and                            advanced to the the cecum,  identified by                            appendiceal orifice and ileocecal valve. The                            colonoscopy was performed without difficulty. The                            patient tolerated the procedure well. The quality                            of the bowel preparation was excellent. The                            ileocecal valve, appendiceal orifice, and rectum                            were photographed. The quality of the bowel                            preparation was evaluated using the BBPS [facility]                            Bowel Preparation Scale) with scores of: Right                            Colon = 2, Transverse Colon = 3 and Left Colon = 3.                            The total BBPS score equals 8. The bowel                            preparation used was SUPREP. Scope In: 9:34:55 AM Scope Out: 10:01:08 AM Scope Withdrawal Time: 0 hours 23 minutes 23 seconds  Total Procedure Duration: 0 hours 26 minutes 13 seconds  Findings:                 The perianal and digital rectal  examinations were                            normal.                           Two sessile polyps were found in the proximal                            ascending colon. The polyps were 1 to 2 mm in size.                            These polyps were removed with a cold biopsy                            forceps. Resection and retrieval were complete.                           Six sessile polyps were found in the mid transverse                            colon and proximal ascending colon. The polyps were                            2 to 6 mm in size. These polyps were removed with a                            cold snare. Resection and retrieval were complete.                           A 12 mm polyp was found in the hepatic flexure. The                            polyp was sessile. The polyp was removed with a hot                            snare. Resection and retrieval were complete.                            The exam was otherwise without abnormality on                            direct and retroflexion views. Complications:            No immediate complications. Estimated Blood Loss:     Estimated blood loss: none. Impression:               - Two 1 to 2 mm polyps in the proximal ascending                            colon, removed with a cold biopsy forceps. Resected                            and  retrieved.                           - Six 2 to 6 mm polyps in the mid transverse colon                            and in the proximal ascending colon, removed with a                            cold snare. Resected and retrieved.                           - One 12 mm polyp at the hepatic flexure, removed                            with a hot snare. Resected and retrieved.                           - The examination was otherwise normal on direct                            and retroflexion views. Recommendation:           - Patient has a contact number available for                            emergencies. The signs and symptoms of potential                            delayed complications were discussed with the                            patient. Return to normal activities tomorrow.                            Written discharge instructions were provided to the                            patient.                           - Resume previous diet.                           - Continue present medications.                           - No aspirin, ibuprofen, naproxen, or other                            non-steroidal anti-inflammatory drugs for 7 days                            after polyp removal.                           -  Await pathology results.                           - Repeat colonoscopy is recommended for                            surveillance. The colonoscopy date will be                            determined after pathology results from today's                             exam become available for review. Tico Crotteau L. Loletha Carrow, MD 01/21/2017 10:06:28 AM This report has been signed electronically.

## 2017-01-22 ENCOUNTER — Telehealth: Payer: Self-pay | Admitting: *Deleted

## 2017-01-22 NOTE — Telephone Encounter (Signed)
  Follow up Call-  Call back number 01/21/2017  Post procedure Call Back phone  # [phone] number  Permission to leave phone message Yes     Patient questions:  Mailbox is full. Will try again later.

## 2017-01-22 NOTE — Telephone Encounter (Signed)
Mailbox full . Unable to leave message

## 2017-01-28 ENCOUNTER — Encounter: Payer: Self-pay | Admitting: Gastroenterology

## 2017-02-04 ENCOUNTER — Ambulatory Visit: Payer: BLUE CROSS/BLUE SHIELD

## 2017-03-11 ENCOUNTER — Ambulatory Visit
Admission: RE | Admit: 2017-03-11 | Discharge: 2017-03-11 | Disposition: A | Discharge status: Home / Self Care | Payer: BLUE CROSS/BLUE SHIELD | Source: Ambulatory Visit | Attending: Physician Assistant | Admitting: Physician Assistant

## 2017-03-11 ENCOUNTER — Telehealth: Payer: Self-pay | Admitting: Physician Assistant

## 2017-03-11 DIAGNOSIS — Z1231 Encounter for screening mammogram for malignant neoplasm of breast: Secondary | ICD-10-CM

## 2017-03-11 NOTE — Telephone Encounter (Signed)
Pt is wondering when she is suppose to come back for a recheck? Please advise at [phone]

## 2017-03-12 NOTE — Telephone Encounter (Signed)
q 3-6 months due to hx of htn, l/m to make an appt.

## 2017-03-22 ENCOUNTER — Telehealth: Payer: Self-pay | Admitting: Physician Assistant

## 2017-03-22 NOTE — Telephone Encounter (Signed)
Pt advised needs appt, tx up front for appt

## 2017-03-22 NOTE — Telephone Encounter (Signed)
Pt's son called saying pt is having pain in her knee and hands. Pt's son says it used to come and go but now the pain is constant. Pt wanted to know if she could get a referral somewhere or if something else could be done for pain. Pt's son also wondering if her current medicine, possibly cholesterol, is causing this. Pt cannot hold anything without feeling pain and is having trouble sleeping due to this. Please advise. Pt's son callback number is [phone].

## 2017-03-27 ENCOUNTER — Ambulatory Visit (INDEPENDENT_AMBULATORY_CARE_PROVIDER_SITE_OTHER): Payer: BLUE CROSS/BLUE SHIELD

## 2017-03-27 ENCOUNTER — Encounter: Payer: Self-pay | Admitting: Family Medicine

## 2017-03-27 ENCOUNTER — Ambulatory Visit (INDEPENDENT_AMBULATORY_CARE_PROVIDER_SITE_OTHER): Payer: BLUE CROSS/BLUE SHIELD | Admitting: Family Medicine

## 2017-03-27 VITALS — BP 119/65 | HR 69 | Temp 98.1°F | Resp 18 | Ht 68.0 in | Wt 160.1 lb

## 2017-03-27 DIAGNOSIS — M25561 Pain in right knee: Secondary | ICD-10-CM | POA: Diagnosis not present

## 2017-03-27 DIAGNOSIS — M1711 Unilateral primary osteoarthritis, right knee: Secondary | ICD-10-CM | POA: Diagnosis not present

## 2017-03-27 MED ORDER — TRAMADOL HCL 50 MG PO TABS: 50.0000 mg | ORAL_TABLET | Freq: Three times a day (TID) | ORAL | 0 refills | Status: DC | PRN

## 2017-03-27 NOTE — Patient Instructions (Addendum)
Like we discussed you do have some arthritis in that knee.  There is no medicine that I can give you that'll make this go away.  The only treatment is pain relief and activity.    Take Ibuprofen for pain relief for moderate pain.  If you are having severe pain, take the Tramadol.    Keep walking for exercise and activity should have been.  I'm also going to refer you to orthopedic surgeon to talk about possibilities of surgery. They will call you sometime this week or next week to schedule that appointment.  It was good to see you today!    IF you received an x-ray today, you will receive an invoice from [facility]. Please contact [facility] at [phone] with questions or concerns regarding your invoice.   IF you received labwork today, you will receive an invoice from [facility]. Please contact [facility] at [phone] with questions or concerns regarding your invoice.   Our billing staff will not be able to assist you with questions regarding bills from these companies.  You will be contacted with the lab results as soon as they are available. The fastest way to get your results is to activate your My Chart account. Instructions are located on the last page of this paperwork. If you have not heard from [place] regarding the results in 2 weeks, please contact this office.

## 2017-03-27 NOTE — Progress Notes (Signed)
Cheryl Hopkins is a 66 y.o. female who presents to Primary Care at [facility] today for Right knee pain:  1.  Right knee pain: She has had evidence of bilateral knee pain for the past several years but her right knee is worse. Patient is an immigrant from [place]. She returns back to [place] once or twice a year. Sounds like she has corticosteroid injections there which help for a couple months but then the pain returns. She takes ibuprofen with some resolution of her pain relief but does not want to continue taking medicine because the relief only lasts for a couple hours and the pain returns.  No falls. No injury. Her leg has not given out on her. Pain is worse when she was from sitting to standing. Also painful she sits for prolonged period of time. Also painful if she has to walk for a prolonged period of time. She has noted some swelling of that knee. No lower extremity edema. No redness.  ROS as above.    PMH reviewed. Patient is a nonsmoker.   Past Medical History:  Diagnosis Date  . Arthritis    knee  . Bronchitis   . Diabetes mellitus without complication ([place])    Several years.  . Hyperlipidemia 09/30/2015  . Hypertension   . Stomach pain    with diarrhea   Past Surgical History:  Procedure Laterality Date  . COLONOSCOPY      Medications reviewed. Current Outpatient Prescriptions  Medication Sig Dispense Refill  . amLODipine (NORVASC) 10 MG tablet TAKE ONE TABLET BY MOUTH ONCE DAILY 90 tablet 1  . glimepiride (AMARYL) 2 MG tablet Take 1 tablet (2 mg total) by mouth daily with breakfast. 30 tablet 11  . meloxicam (MOBIC) 7.5 MG tablet Take 1 tablet (7.5 mg total) by mouth daily. 90 tablet 0  . metFORMIN (GLUCOPHAGE-XR) 500 MG 24 hr tablet Take 1 tablet (500 mg total) by mouth 2 (two) times daily with a meal. 60 tablet 11  . simvastatin (ZOCOR) 20 MG tablet Take 1 tablet (20 mg total) by mouth daily. Take with evening meal 30 tablet 11   Current Facility-Administered  Medications  Medication Dose Route Frequency Provider Last Rate Last Dose  . 0.9 %  sodium chloride infusion  500 mL Intravenous Continuous Doran Stabler, MD         Physical Exam:  BP 119/65   Pulse 69   Temp 98.1 F (36.7 C) (Oral)   Resp 18   Ht 5\' 8"  (1.727 m)   Wt 160 lb 2 oz (72.6 kg)   LMP 11/02/2010 (Approximate) Comment: postmenopausal  SpO2 94%   BMI 24.35 kg/m  Gen:  Alert, cooperative patient who appears stated age in no acute distress.  Vital signs reviewed. HEENT: EOMI,  MMM MSK: - left knee currently without any pain or tenderness. -Right knee: She does have some mild effusion in the suprapatellar bursa. I also palpate a mild effusion when palpating along the medial and lateral joint lines of her knee. She does not have any tenderness along the lateral joint line but does have some on the medial joint line. No redness to her knee. Anterior-posterior drawer is negative. No varus or valgus laxity. She does move stiffly and had difficulty moving from sitting to standing Ext:  No LE edema or redness  Assessment and Plan:  1.  Right knee pain: - likely some degree of osteoarthritis. -Patient is "tired of dealing with her with pain."  Plan is to continue analgesia with tramadol and ibuprofen.  Home PT to help with muscle strength.  -She does not seem to have any meniscal tear. Did discuss that she likely has based on her age some degree of osteoarthritis and we will refer her to orthopedics to see if they have any other recommendations for her. -She is interested in surgery if it would help with her pain.

## 2017-04-22 ENCOUNTER — Other Ambulatory Visit: Payer: Self-pay | Admitting: Physician Assistant

## 2017-04-22 DIAGNOSIS — E7800 Pure hypercholesterolemia, unspecified: Secondary | ICD-10-CM

## 2017-04-22 NOTE — Telephone Encounter (Signed)
I have given the patient a 30d supply of cholesterol medication - please make an appt to see me within the 1 month time frame.

## 2017-05-03 ENCOUNTER — Encounter (INDEPENDENT_AMBULATORY_CARE_PROVIDER_SITE_OTHER): Payer: Self-pay | Admitting: Orthopedic Surgery

## 2017-05-03 ENCOUNTER — Ambulatory Visit (INDEPENDENT_AMBULATORY_CARE_PROVIDER_SITE_OTHER): Payer: BLUE CROSS/BLUE SHIELD | Admitting: Orthopedic Surgery

## 2017-05-03 VITALS — Ht 68.0 in | Wt 160.0 lb

## 2017-05-03 DIAGNOSIS — M25561 Pain in right knee: Secondary | ICD-10-CM | POA: Diagnosis not present

## 2017-05-03 DIAGNOSIS — M79642 Pain in left hand: Secondary | ICD-10-CM | POA: Diagnosis not present

## 2017-05-03 DIAGNOSIS — G8929 Other chronic pain: Secondary | ICD-10-CM

## 2017-05-03 NOTE — Progress Notes (Signed)
Office Visit Note   Patient: Cheryl Hopkins           Date of Birth: 04/16/1951           MRN: [id number] Visit Date: 05/03/2017              Requested by: Alveda Reasons, MD [address] [place], [place] [address] PCP: Mancel Bale, PA-C  Chief Complaint  Patient presents with  . Right Knee - Pain      HPI: Patient is a 66 year old woman who states that she's been having right knee pain for a while. She points to the origin of the MCL is where her knee hurts she states she's had swelling has been treated with injections in the past without relief. Patient is right-hand dominant. She also complains of left hand pain over the A1 pulley ring finger. She denies any triggering.  Assessment & Plan: Visit Diagnoses:  1. Pain in left hand   2. Chronic pain of right knee     Plan: Recommended the ibuprofen 600 mg 3 times a day as needed for pain she states that the ibuprofen has been helping her. Do not recommend any surgical intervention this time she does not need any surgery for the right knee her joint is congruent and asymptomatic and she has no triggering at the A1 pulley though she is symptomatic this does improve with ibuprofen.  Follow-Up Instructions: Return if symptoms worsen or fail to improve.   Ortho Exam  Patient is alert, oriented, no adenopathy, well-dressed, normal affect, normal respiratory effort. Patient has a normal gait. Examination of the right knee there is no effusion no redness no cellulitis she does have tenderness to palpation of the origin of the MCL varus and valgus and anterior drawer is stable. The medial lateral joint line are nontender to palpation. Radiographs shows some calcification at the origin of the MCL. The joint is congruent no evidence of arthritic changes. Examination of the left hand she has full range of motion of her fingers. She has no triggering with palpation over the A1 pulley.  Imaging: No results found.  Labs: Lab  Results  Component Value Date   HGBA1C 7.1 (H) 11/05/2016    Orders:  No orders of the defined types were placed in this encounter.  No orders of the defined types were placed in this encounter.    Procedures: No procedures performed  Clinical Data: No additional findings.  ROS:  All other systems negative, except as noted in the HPI. Review of Systems  Objective: Vital Signs: Ht 5\' 8"  (1.727 m)   Wt 160 lb (72.6 kg)   LMP 11/02/2010 (Approximate) Comment: postmenopausal  BMI 24.33 kg/m   Specialty Comments:  No specialty comments available.  PMFS History: Patient Active Problem List   Diagnosis Date Noted  . Chronic lung disease 10/31/2015  . Hypertension 10/31/2015  . Diabetes mellitus type II, controlled ([place]) 09/30/2015  . Hyperlipidemia 09/30/2015   Past Medical History:  Diagnosis Date  . Arthritis    knee  . Bronchitis   . Diabetes mellitus without complication ([place])    Several years.  . Hyperlipidemia 09/30/2015  . Hypertension   . Stomach pain    with diarrhea    Family History  Problem Relation Age of Onset  . Bronchitis Sister   . Hepatitis Daughter   . Hypertension Sister   . Stroke Sister   . Colon cancer Neg Hx     Past Surgical  History:  Procedure Laterality Date  . COLONOSCOPY     Social History   Occupational History  . Housewife    Social History Main Topics  . Smoking status: Never Smoker  . Smokeless tobacco: Never Used     Comment: Chews cola nut  . Alcohol use No  . Drug use: No  . Sexual activity: Not Currently

## 2017-05-18 ENCOUNTER — Other Ambulatory Visit: Payer: Self-pay | Admitting: Physician Assistant

## 2017-05-18 DIAGNOSIS — I10 Essential (primary) hypertension: Secondary | ICD-10-CM

## 2017-05-18 DIAGNOSIS — E7800 Pure hypercholesterolemia, unspecified: Secondary | ICD-10-CM

## 2017-05-20 NOTE — Telephone Encounter (Signed)
Needs ov with Judson Roch prior to more refills

## 2017-05-24 NOTE — Telephone Encounter (Signed)
Letter mailed to pt about making an apt

## 2017-07-23 ENCOUNTER — Other Ambulatory Visit: Payer: Self-pay | Admitting: Physician Assistant

## 2017-07-23 ENCOUNTER — Other Ambulatory Visit: Payer: Self-pay | Admitting: Internal Medicine

## 2017-07-23 DIAGNOSIS — E7800 Pure hypercholesterolemia, unspecified: Secondary | ICD-10-CM

## 2017-07-23 DIAGNOSIS — E119 Type 2 diabetes mellitus without complications: Secondary | ICD-10-CM

## 2017-09-17 ENCOUNTER — Telehealth: Payer: Self-pay | Admitting: Physician Assistant

## 2017-09-17 NOTE — Telephone Encounter (Signed)
Request refill 

## 2017-09-17 NOTE — Telephone Encounter (Signed)
Copied from [facility] [id number]. Topic: Inquiry >> Sep 17, 2017  2:54 PM Malena Catholic [place], [place] wrote: Reason for CRM: pt son  Berna Spare call for Rx Refill Metformin 50 mg Amaryl  2 mg Norvasc 10 mg tramadol 50 mg Crestor 20 mg pt son would like 6 Month   Supplied because she is in [place]

## 2017-09-20 NOTE — Telephone Encounter (Signed)
Left detailed message per Release  Patient hasn't been seen in 10 months.  Her Norvasc and crestor were last filled in 7/18 with only 30 days.  How long has the patient been without?     We cannot fill her tramadol that was given in 03-2017 by Dr. Mingo Amber.  No refills.   Meformin/Amaryl were filled for 1 year by Dr Tawni Carnes

## 2017-09-21 ENCOUNTER — Telehealth: Payer: Self-pay

## 2017-09-21 NOTE — Telephone Encounter (Signed)
Copied from [place] [id number]. Topic: Inquiry >> Sep 20, 2017  6:00 PM [place], [place], [place] wrote: Reason for CRM: Patient son calling for mother she had a missed call from the office and would like a call back please at [phone]    Suszanne Finch, LPN  Licensed Practical Nurse    Telephone Encounter  Signed  Encounter Date:  09/17/2017          Signed            Left detailed message per Release  Patient hasn't been seen in 10 months.  Her Norvasc and crestor were last filled in 7/18 with only 30 days.  How long has the patient been without?     We cannot fill her tramadol that was given in 03-2017 by Dr. Mingo Amber.  No refills.   Meformin/Amaryl were filled for 1 year by Dr Tawni Carnes          Electronically signed by Suszanne Finch, LPN at 06/15/4817 56:31 PM    Phone call to patient's son, Ilda Mori. Patient has been in [place] for 4 months, is returning next year. Discussed message left by Almyra Free above. Relayed that metformin and amaryl were sent to [facility] on [address], she has been given a year supply of these. Recommend she be seen by provider in [place] for medication refills of cholesterol and blood pressure medications since she will be [place] for [employer]. He is agreeable, will call with further questions or concerns.

## 2017-09-25 ENCOUNTER — Other Ambulatory Visit: Payer: Self-pay | Admitting: Physician Assistant

## 2017-09-25 DIAGNOSIS — E7800 Pure hypercholesterolemia, unspecified: Secondary | ICD-10-CM

## 2017-09-25 DIAGNOSIS — I10 Essential (primary) hypertension: Secondary | ICD-10-CM

## 2017-11-16 ENCOUNTER — Other Ambulatory Visit: Payer: Self-pay | Admitting: Physician Assistant

## 2017-11-16 DIAGNOSIS — I10 Essential (primary) hypertension: Secondary | ICD-10-CM

## 2019-03-17 ENCOUNTER — Telehealth: Payer: Self-pay | Admitting: Family Medicine

## 2019-03-17 NOTE — Telephone Encounter (Signed)
Has an apt with you on 03/29/2019 are you willing to provider meds.

## 2019-03-17 NOTE — Telephone Encounter (Signed)
PT just came back from [place] and needs a refill on blood pressure meds. PT has not been seen since 2018, but has on upcoming appt on 5/27 and will establish care at a later date. PT is needing a limited supply until her next appt.

## 2019-03-29 ENCOUNTER — Other Ambulatory Visit: Payer: Self-pay

## 2019-03-29 ENCOUNTER — Ambulatory Visit (INDEPENDENT_AMBULATORY_CARE_PROVIDER_SITE_OTHER): Payer: Self-pay | Admitting: Family Medicine

## 2019-03-29 ENCOUNTER — Telehealth (INDEPENDENT_AMBULATORY_CARE_PROVIDER_SITE_OTHER): Payer: BLUE CROSS/BLUE SHIELD | Admitting: Family Medicine

## 2019-03-29 ENCOUNTER — Other Ambulatory Visit: Payer: Self-pay | Admitting: Family Medicine

## 2019-03-29 DIAGNOSIS — I10 Essential (primary) hypertension: Secondary | ICD-10-CM

## 2019-03-29 DIAGNOSIS — G8929 Other chronic pain: Secondary | ICD-10-CM | POA: Insufficient documentation

## 2019-03-29 DIAGNOSIS — M25562 Pain in left knee: Secondary | ICD-10-CM

## 2019-03-29 DIAGNOSIS — M25561 Pain in right knee: Secondary | ICD-10-CM

## 2019-03-29 DIAGNOSIS — E7800 Pure hypercholesterolemia, unspecified: Secondary | ICD-10-CM

## 2019-03-29 DIAGNOSIS — E119 Type 2 diabetes mellitus without complications: Secondary | ICD-10-CM

## 2019-03-29 MED ORDER — GLIMEPIRIDE 2 MG PO TABS: ORAL_TABLET | ORAL | 0 refills | Status: DC

## 2019-03-29 MED ORDER — AMLODIPINE BESYLATE 10 MG PO TABS: 10.0000 mg | ORAL_TABLET | Freq: Every day | ORAL | 0 refills | Status: DC

## 2019-03-29 MED ORDER — MELOXICAM 7.5 MG PO TABS: 7.5000 mg | ORAL_TABLET | Freq: Every day | ORAL | 0 refills | Status: DC

## 2019-03-29 MED ORDER — ROSUVASTATIN CALCIUM 20 MG PO TABS: 20.0000 mg | ORAL_TABLET | Freq: Every day | ORAL | 0 refills | Status: DC

## 2019-03-29 MED ORDER — METFORMIN HCL ER 500 MG PO TB24: 500.0000 mg | ORAL_TABLET | Freq: Two times a day (BID) | ORAL | 0 refills | Status: DC

## 2019-03-29 NOTE — Progress Notes (Signed)
Chief Compliant: med refill Norvasc, metformin, rosuvastatin.  (pending Pt states that she would like a refill on Meloxicam due to leg chronic leg pain  Pt son is with her as her interpreter. Pt speaks [place]

## 2019-03-29 NOTE — Progress Notes (Signed)
Telemedicine Encounter- SOAP NOTE Established Patient  I discussed the limitations, risks, security and privacy concerns of performing an evaluation and management service by telephone and the availability of in person appointments. I also discussed with the patient that there may be a patient responsible charge related to this service. The patient expressed understanding and agreed to proceed.  This telephone encounter was conducted with the patient's son verbal consent via audio telecommunications: yes Patient was instructed to have this encounter in a suitably private space; and to only have persons present to whom they give permission to participate. In addition, patient identity was confirmed by use of name plus two identifiers (DOB and address).  I spent a total of 32min talking with the patient's son-pt speaks [place] and [place] interpreter could not Journalist, newspaper Compliant: med refill Norvasc, metformin, rosuvastatin.   Pt states that she would like a refill on Meloxicam due to leg chronic leg pain  Pt son is with her as her interpreter. Pt speaks [place]  Cheryl Hopkins is a 68 y.o. female established patient. Telephone visit today for refills on medication.   Pt took last norvasc today and needs refill on medication-pt recently returned from [place] where she was seen by a local doctor for medication. She has not missed any blood pressure medication and reports to her son that her blood pressure was normal while in [place]. Pt denies headaches, visual changes, chest pain.  Pt has been taking metformin and amaryl for diabetes. Pt has not missed any doses of this medication. Pt does not have a glucose monitor. Pt states her A1c was 6.9% in March in [place]. Pt has not experienced increase in urinary frequency.  Pt needs refill on cholesterol medication-pt is currently not taking medication for cholesterol. Pt has not completed recent labwork for cholesterol. Pt has not eaten this  morning  Pt has bilat knee pain and request mobic to use prn-pt is no longer using tramadol. Patient Active Problem List   Diagnosis Date Noted  . Chronic pain of both knees 03/29/2019  . Chronic lung disease 10/31/2015  . Hypertension 10/31/2015  . Diabetes mellitus type II, controlled ([place]) 09/30/2015  . Hyperlipidemia 09/30/2015    Past Medical History:  Diagnosis Date  . Arthritis    knee  . Bronchitis   . Diabetes mellitus without complication ([place])    Several years.  . Hyperlipidemia 09/30/2015  . Hypertension   . Stomach pain    with diarrhea    Current Outpatient Medications  Medication Sig Dispense Refill  . amLODipine (NORVASC) 10 MG tablet Take 1 tablet (10 mg total) by mouth daily. 30 tablet 0  . glimepiride (AMARYL) 2 MG tablet TAKE ONE TABLET BY MOUTH WITH BREAKFAST 30 tablet 0  . metFORMIN (GLUCOPHAGE-XR) 500 MG 24 hr tablet Take 1 tablet (500 mg total) by mouth 2 (two) times daily with a meal. 120 tablet 0  . rosuvastatin (CRESTOR) 20 MG tablet TAKE 1 TABLET BY MOUTH ONCE DAILY 30 tablet 0  . meloxicam (MOBIC) 7.5 MG tablet Take 1 tablet (7.5 mg total) by mouth daily. 90 tablet 0  . simvastatin (ZOCOR) 20 MG tablet Take 1 tablet (20 mg total) by mouth daily. Take with evening meal (Patient not taking: Reported on 03/29/2019) 30 tablet 11  . traMADol (ULTRAM) 50 MG tablet Take 1 tablet (50 mg total) by mouth every 8 (eight) hours as needed. (Patient not taking: Reported on 03/29/2019) 25 tablet 0   Current Facility-Administered Medications  Medication Dose Route Frequency Provider Last Rate Last Dose  . 0.9 %  sodium chloride infusion  500 mL Intravenous Continuous Danis, Kirke Corin, MD        No Known Allergies  Social History   Socioeconomic History  . Marital status: Widowed    Spouse name: Widowed since 2010  . Number of children: 9  . Years of education: <8  . Highest education level: Not on file  Occupational History  . Occupation: Housewife   Social Needs  . Financial resource strain: Not on file  . Food insecurity:    Worry: Not on file    Inability: Not on file  . Transportation needs:    Medical: Not on file    Non-medical: Not on file  Tobacco Use  . Smoking status: Never Smoker  . Smokeless tobacco: Never Used  . Tobacco comment: Chews cola nut  Substance and Sexual Activity  . Alcohol use: No    Alcohol/week: 0.0 standard drinks  . Drug use: No  . Sexual activity: Not Currently  Lifestyle  . Physical activity:    Days per week: Not on file    Minutes per session: Not on file  . Stress: Not on file  Relationships  . Social connections:    Talks on phone: Not on file    Gets together: Not on file    Attends religious service: Not on file    Active member of club or organization: Not on file    Attends meetings of clubs or organizations: Not on file    Relationship status: Not on file  . Intimate partner violence:    Fear of current or ex partner: Not on file    Emotionally abused: Not on file    Physically abused: Not on file    Forced sexual activity: Not on file  Other Topics Concern  . Not on file  Social History Narrative      Widow   Originally from [place] - Came to [facility]. In 2016   Lives with son and his wife and their 3 children.        Exercise - no exercise      Seatbelt - 100%   Gun in home - no    Review of Systems  Constitutional: Negative for fever.  HENT: Negative for congestion.   Eyes: Negative for blurred vision.  Respiratory: Negative for cough.   Cardiovascular: Negative for chest pain.  Gastrointestinal: Negative for diarrhea.  Genitourinary: Negative for frequency and urgency.  Musculoskeletal: Positive for joint pain.  Neurological: Negative for weakness.    Objective   Vitals as reported by the patient: Today's Vitals   none Diagnoses and all orders for this visit:  Essential hypertension -     amLODipine (NORVASC) 10 MG tablet; Take 1 tablet (10 mg total) by  mouth daily. -     Comprehensive metabolic panel -     CBC with Differential bp monitor requested for follow up Chronic pain of both knees -     meloxicam (MOBIC) 7.5 MG tablet; Take 1 tablet (7.5 mg total) by mouth daily. -     CBC with Differential Long term use of mobic-take with food Pure hypercholesterolemia -     Lipid Panel Refilled crestor -rx-needs lipid panel Controlled type 2 diabetes mellitus without complication, without long-term current use of insulin (HCC) -     metFORMIN (GLUCOPHAGE-XR) 500 MG 24 hr tablet; Take 1 tablet (500 mg total) by mouth  2 (two) times daily with a meal. -     glimepiride (AMARYL) 2 MG tablet; TAKE ONE TABLET BY MOUTH WITH BREAKFAST -     Comprehensive metabolic panel -     POCT urinalysis dipstick -     Microalbumin, urine -     Hemoglobin A1c Son will purchase glucose monitor for pt  Son understands pt must have labwork for additional refills 30 day supply given. Pt needs follow up appt in clinic since she has not been seen since 2018 due to travel.   I discussed the assessment and treatment plan with the patient. The patient was provided an opportunity to ask questions via her son as the interpreter and all were answered. The patient agreed with the plan and demonstrated an understanding of the instructions.   The patient was advised to call back or seek an in-person evaluation for her chronic medical problems I provided 15 minutes of non-face-to-face time during this encounter.  Serafin Decatur Hannah Beat, MD  Primary Care at [facility] 03-29-19

## 2019-03-30 LAB — MICROALBUMIN, URINE: Microalbumin, Urine: <3 ug/mL

## 2019-04-10 ENCOUNTER — Telehealth: Payer: Self-pay | Admitting: Family Medicine

## 2019-04-10 NOTE — Telephone Encounter (Signed)
Spoke to patient's son. He stated she was seen here on 03/29/19 and gave a urine sample and blood sample and they wanted to know the results. They was told if patient diabetes level has lowered then the may lower patient's diabetes medication.  Only lab I see is a albumin which was 3.0 and have came down from a 3.7. if patients is to keep taking this dose of medication then we need to send in a refill for her diabetes med

## 2019-04-10 NOTE — Telephone Encounter (Signed)
Copied from [place] [phone]. Topic: General - Other >> Apr 10, 2019 10:55 AM Carolyn Stare wrote:  Pt son said he was returning a call

## 2019-04-12 NOTE — Telephone Encounter (Signed)
Lab orders were discontinued from May 27th?? pts son now asking for results. Was blood sent?

## 2019-04-13 NOTE — Telephone Encounter (Signed)
The lab results does look like it was canceled. Can you please let me know why so I can relay that message to the pts Son.

## 2019-04-18 NOTE — Telephone Encounter (Signed)
labwork orders cancelled-will need a redraw

## 2019-04-21 NOTE — Telephone Encounter (Signed)
I have called pt no answer so I left a message to call back.

## 2019-04-27 ENCOUNTER — Other Ambulatory Visit: Payer: Self-pay | Admitting: Emergency Medicine

## 2019-04-27 ENCOUNTER — Telehealth: Payer: Self-pay | Admitting: General Practice

## 2019-04-27 DIAGNOSIS — G8929 Other chronic pain: Secondary | ICD-10-CM

## 2019-04-27 DIAGNOSIS — I10 Essential (primary) hypertension: Secondary | ICD-10-CM

## 2019-04-27 DIAGNOSIS — M25562 Pain in left knee: Secondary | ICD-10-CM

## 2019-04-27 DIAGNOSIS — E119 Type 2 diabetes mellitus without complications: Secondary | ICD-10-CM

## 2019-04-27 DIAGNOSIS — E7800 Pure hypercholesterolemia, unspecified: Secondary | ICD-10-CM

## 2019-04-27 MED ORDER — MELOXICAM 7.5 MG PO TABS: 7.5000 mg | ORAL_TABLET | Freq: Every day | ORAL | 0 refills | Status: DC

## 2019-04-27 MED ORDER — AMLODIPINE BESYLATE 10 MG PO TABS: 10.0000 mg | ORAL_TABLET | Freq: Every day | ORAL | 0 refills | Status: DC

## 2019-04-27 MED ORDER — METFORMIN HCL ER 500 MG PO TB24: 500.0000 mg | ORAL_TABLET | Freq: Two times a day (BID) | ORAL | 0 refills | Status: DC

## 2019-04-27 MED ORDER — GLIMEPIRIDE 2 MG PO TABS: ORAL_TABLET | ORAL | 0 refills | Status: DC

## 2019-04-27 MED ORDER — ROSUVASTATIN CALCIUM 20 MG PO TABS: 20.0000 mg | ORAL_TABLET | Freq: Every day | ORAL | 0 refills | Status: DC

## 2019-04-27 NOTE — Telephone Encounter (Signed)
Refills has been sent. 

## 2019-04-27 NOTE — Telephone Encounter (Signed)
Medication Refill - Medication: amLODipine (NORVASC) 10 MG tablet Pt is out of the above med and needs asap  Pt also needs refills on the meds below and would like to know if she can have 90 day supplies with 3 refills on the Rx/ please advise   glimepiride (AMARYL) 2 MG tablet  meloxicam (MOBIC) 7.5 MG tablet  metFORMIN (GLUCOPHAGE-XR) 500 MG 24 hr tablet  rosuvastatin (CRESTOR) 20 MG tablet    Has the patient contacted their pharmacy? No. (Agent: If no, request that the patient contact the pharmacy for the refill.) (Agent: If yes, when and what did the pharmacy advise?)  Preferred Pharmacy (with phone number or street name):  [facility] ([address] [place] [phone] (Phone) [phone] (Fax)     Agent: Please be advised that RX refills may take up to 3 business days. We ask that you follow-up with your pharmacy.

## 2019-05-08 ENCOUNTER — Ambulatory Visit: Payer: Self-pay | Admitting: Emergency Medicine

## 2019-05-08 ENCOUNTER — Encounter: Payer: Self-pay | Admitting: Emergency Medicine

## 2019-05-08 ENCOUNTER — Other Ambulatory Visit: Payer: Self-pay

## 2019-05-08 VITALS — BP 110/66 | HR 88 | Temp 98.6°F | Resp 16 | Wt 159.4 lb

## 2019-05-08 DIAGNOSIS — I10 Essential (primary) hypertension: Secondary | ICD-10-CM

## 2019-05-08 DIAGNOSIS — E119 Type 2 diabetes mellitus without complications: Secondary | ICD-10-CM

## 2019-05-08 DIAGNOSIS — E7800 Pure hypercholesterolemia, unspecified: Secondary | ICD-10-CM

## 2019-05-08 DIAGNOSIS — H6592 Unspecified nonsuppurative otitis media, left ear: Secondary | ICD-10-CM

## 2019-05-08 MED ORDER — HYDROCORTISONE-ACETIC ACID 1-2 % OT SOLN: 3.0000 [drp] | Freq: Three times a day (TID) | OTIC | 1 refills | Status: DC

## 2019-05-08 MED ORDER — AMOXICILLIN-POT CLAVULANATE 875-125 MG PO TABS: 1.0000 | ORAL_TABLET | Freq: Two times a day (BID) | ORAL | 0 refills | Status: AC

## 2019-05-08 NOTE — Progress Notes (Signed)
BP Readings from Last 3 Encounters:  03/27/17 119/65  01/21/17 116/60  11/05/16 118/80   Lab Results  Component Value Date   HGBA1C 7.1 (H) 11/05/2016   Lab Results  Component Value Date   CHOL 340 (H) 11/05/2016   HDL 65 11/05/2016   LDLCALC 252 (H) 11/05/2016   TRIG 115 11/05/2016   CHOLHDL 5.2 (H) 11/05/2016   Cheryl Hopkins 68 y.o.   Chief Complaint  Patient presents with  . Ear Pain    LEFT x 3 weeks  . Establish Care    HISTORY OF PRESENT ILLNESS: This is a 68 y.o. female complaining of pain to her left ear for 1 week with decreased hearing. Has a history of diabetes and hypertension.  Wants to establish care with me.  HPI   Prior to Admission medications   Medication Sig Start Date End Date Taking? Authorizing Provider  amLODipine (NORVASC) 10 MG tablet Take 1 tablet (10 mg total) by mouth daily. 04/27/19   Maryruth Hancock, MD  glimepiride (AMARYL) 2 MG tablet TAKE ONE TABLET BY MOUTH WITH BREAKFAST 04/27/19   Corum, Rex Kras, MD  meloxicam (MOBIC) 7.5 MG tablet Take 1 tablet (7.5 mg total) by mouth daily. 04/27/19   Corum, Rex Kras, MD  metFORMIN (GLUCOPHAGE-XR) 500 MG 24 hr tablet Take 1 tablet (500 mg total) by mouth 2 (two) times daily with a meal. 04/27/19   Corum, Rex Kras, MD  rosuvastatin (CRESTOR) 20 MG tablet Take 1 tablet (20 mg total) by mouth daily. 04/27/19   Maryruth Hancock, MD    No Known Allergies  Patient Active Problem List   Diagnosis Date Noted  . Chronic pain of both knees 03/29/2019  . Chronic lung disease 10/31/2015  . Hypertension 10/31/2015  . Diabetes mellitus type II, controlled ([place]) 09/30/2015  . Hyperlipidemia 09/30/2015    Past Medical History:  Diagnosis Date  . Arthritis    knee  . Bronchitis   . Diabetes mellitus without complication ([place])    Several years.  . Hyperlipidemia 09/30/2015  . Hypertension   . Stomach pain    with diarrhea    Past Surgical History:  Procedure Laterality Date  . COLONOSCOPY      Social  History   Socioeconomic History  . Marital status: Widowed    Spouse name: Widowed since 2010  . Number of children: 9  . Years of education: <8  . Highest education level: Not on file  Occupational History  . Occupation: Housewife  Social Needs  . Financial resource strain: Not on file  . Food insecurity    Worry: Not on file    Inability: Not on file  . Transportation needs    Medical: Not on file    Non-medical: Not on file  Tobacco Use  . Smoking status: Never Smoker  . Smokeless tobacco: Never Used  . Tobacco comment: Chews cola nut  Substance and Sexual Activity  . Alcohol use: No    Alcohol/week: 0.0 standard drinks  . Drug use: No  . Sexual activity: Not Currently  Lifestyle  . Physical activity    Days per week: Not on file    Minutes per session: Not on file  . Stress: Not on file  Relationships  . Social Herbalist on phone: Not on file    Gets together: Not on file    Attends religious service: Not on file    Active member of club or organization: Not on  file    Attends meetings of clubs or organizations: Not on file    Relationship status: Not on file  . Intimate partner violence    Fear of current or ex partner: Not on file    Emotionally abused: Not on file    Physically abused: Not on file    Forced sexual activity: Not on file  Other Topics Concern  . Not on file  Social History Narrative      Widow   Originally from [place] - Came to [facility]. In 2016   Lives with son and his wife and their 3 children.        Exercise - no exercise      Seatbelt - 100%   Gun in home - no    Family History  Problem Relation Age of Onset  . Bronchitis Sister   . Hepatitis Daughter   . Hypertension Sister   . Stroke Sister   . Colon cancer Neg Hx      Review of Systems  Constitutional: Negative.  Negative for chills and fever.  HENT: Positive for ear pain and hearing loss. Negative for congestion, ear discharge, nosebleeds and sore throat.    Eyes: Negative.  Negative for blurred vision and double vision.  Respiratory: Negative.  Negative for cough and shortness of breath.   Cardiovascular: Negative.  Negative for chest pain and palpitations.  Gastrointestinal: Negative.  Negative for abdominal pain, diarrhea, nausea and vomiting.  Genitourinary: Negative.   Musculoskeletal: Negative.  Negative for myalgias and neck pain.  Skin: Negative.  Negative for rash.  Neurological: Negative.  Negative for dizziness and headaches.  Endo/Heme/Allergies: Negative.    Vitals:   05/08/19 1119  BP: 110/66  Pulse: 88  Resp: 16  Temp: 98.6 F (37 C)  SpO2: 94%     Physical Exam Vitals signs reviewed.  Constitutional:      Appearance: Normal appearance.  HENT:     Head: Normocephalic and atraumatic.     Right Ear: Tympanic membrane, ear canal and external ear normal. There is no impacted cerumen.     Left Ear: Ear canal and external ear normal. A middle ear effusion is present. There is no impacted cerumen. Tympanic membrane is retracted.  Neck:     Musculoskeletal: Normal range of motion and neck supple.  Cardiovascular:     Rate and Rhythm: Normal rate and regular rhythm.     Heart sounds: Normal heart sounds.  Pulmonary:     Effort: Pulmonary effort is normal.     Breath sounds: Normal breath sounds.  Musculoskeletal: Normal range of motion.  Lymphadenopathy:     Cervical: No cervical adenopathy.  Skin:    General: Skin is warm and dry.  Neurological:     General: No focal deficit present.     Mental Status: She is alert and oriented to person, place, and time.      ASSESSMENT & PLAN: Cheryl Hopkins was seen today for ear pain and establish care.  Diagnoses and all orders for this visit:  Left otitis media with effusion -     amoxicillin-clavulanate (AUGMENTIN) 875-125 MG tablet; Take 1 tablet by mouth 2 (two) times daily for 7 days. -     acetic acid-hydrocortisone (VOSOL-HC) OTIC solution; Place 3 drops into the left  ear 3 (three) times daily.  Essential hypertension  Controlled type 2 diabetes mellitus without complication, without long-term current use of insulin ([place])  Pure hypercholesterolemia    Patient Instructions  If you have lab work done today you will be contacted with your lab results within the next 2 weeks.  If you have not heard from [place] then please contact us. The fastest way to get your results is to register for My Chart.   IF you received an x-ray today, you will receive an invoice from [facility]. Please contact [facility] at [phone] with questions or concerns regarding your invoice.   IF you received labwork today, you will receive an invoice from [facility]. Please contact [facility] at [phone] with questions or concerns regarding your invoice.   Our billing staff will not be able to assist you with questions regarding bills from these companies.  You will be contacted with the lab results as soon as they are available. The fastest way to get your results is to activate your My Chart account. Instructions are located on the last page of this paperwork. If you have not heard from [place] regarding the results in 2 weeks, please contact this office.     Otitis Media, Adult  Otitis media means that the middle ear is red and swollen (inflamed) and full of fluid. The condition usually goes away on its own. Follow these instructions at home:  Take over-the-counter and prescription medicines only as told by your doctor.  If you were prescribed an antibiotic medicine, take it as told by your doctor. Do not stop taking the antibiotic even if you start to feel better.  Keep all follow-up visits as told by your doctor. This is important. Contact a doctor if:  You have bleeding from your nose.  There is a lump on your neck.  You are not getting better in 5 days.  You feel worse instead of better. Get help right away if:  You have pain that is  not helped with medicine.  You have swelling, redness, or pain around your ear.  You get a stiff neck.  You cannot move part of your face (paralyzed).  You notice that the bone behind your ear hurts when you touch it.  You get a very bad headache. Summary  Otitis media means that the middle ear is red, swollen, and full of fluid.  This condition usually goes away on its own. In some cases, treatment may be needed.  If you were prescribed an antibiotic medicine, take it as told by your doctor. This information is not intended to replace advice given to you by your health care provider. Make sure you discuss any questions you have with your health care provider. Document Released: 04/06/2008 Document Revised: 10/01/2017 Document Reviewed: 11/09/2016 Elsevier Patient Education  2020 Elsevier Inc.      Agustina Caroli, MD [facility]

## 2019-05-08 NOTE — Patient Instructions (Addendum)
     If you have lab work done today you will be contacted with your lab results within the next 2 weeks.  If you have not heard from [place] then please contact us. The fastest way to get your results is to register for My Chart.   IF you received an x-ray today, you will receive an invoice from [facility]. Please contact [facility] at [phone] with questions or concerns regarding your invoice.   IF you received labwork today, you will receive an invoice from [facility]. Please contact [facility] at [phone] with questions or concerns regarding your invoice.   Our billing staff will not be able to assist you with questions regarding bills from these companies.  You will be contacted with the lab results as soon as they are available. The fastest way to get your results is to activate your My Chart account. Instructions are located on the last page of this paperwork. If you have not heard from [place] regarding the results in 2 weeks, please contact this office.     Otitis Media, Adult  Otitis media means that the middle ear is red and swollen (inflamed) and full of fluid. The condition usually goes away on its own. Follow these instructions at home:  Take over-the-counter and prescription medicines only as told by your doctor.  If you were prescribed an antibiotic medicine, take it as told by your doctor. Do not stop taking the antibiotic even if you start to feel better.  Keep all follow-up visits as told by your doctor. This is important. Contact a doctor if:  You have bleeding from your nose.  There is a lump on your neck.  You are not getting better in 5 days.  You feel worse instead of better. Get help right away if:  You have pain that is not helped with medicine.  You have swelling, redness, or pain around your ear.  You get a stiff neck.  You cannot move part of your face (paralyzed).  You notice that the bone behind your ear hurts when you touch  it.  You get a very bad headache. Summary  Otitis media means that the middle ear is red, swollen, and full of fluid.  This condition usually goes away on its own. In some cases, treatment may be needed.  If you were prescribed an antibiotic medicine, take it as told by your doctor. This information is not intended to replace advice given to you by your health care provider. Make sure you discuss any questions you have with your health care provider. Document Released: 04/06/2008 Document Revised: 10/01/2017 Document Reviewed: 11/09/2016 Elsevier Patient Education  2020 [employer].

## 2019-05-23 ENCOUNTER — Telehealth: Payer: Self-pay | Admitting: Emergency Medicine

## 2019-05-23 DIAGNOSIS — I10 Essential (primary) hypertension: Secondary | ICD-10-CM

## 2019-05-23 NOTE — Telephone Encounter (Unsigned)
Copied from [place] [phone]. Topic: Quick Communication - Rx Refill/Question >> May 23, 2019 12:51 PM Mcneil, Ja-Kwan wrote: Medication:  amLODipine (NORVASC) 10 MG tablet   Has the patient contacted their pharmacy? no  Preferred Pharmacy (with phone number or street name): [facility] ([address]), [place] - [place] [phone] (Phone)  [phone] (Fax)  Agent: Please be advised that RX refills may take up to 3 business days. We ask that you follow-up with your pharmacy.

## 2019-05-24 MED ORDER — AMLODIPINE BESYLATE 10 MG PO TABS: 10.0000 mg | ORAL_TABLET | Freq: Every day | ORAL | 5 refills | Status: DC

## 2019-07-12 ENCOUNTER — Other Ambulatory Visit: Payer: Self-pay

## 2019-07-12 ENCOUNTER — Ambulatory Visit (HOSPITAL_COMMUNITY)
Admission: EM | Admit: 2019-07-12 | Discharge: 2019-07-12 | Disposition: A | Discharge status: Home / Self Care | Payer: No Typology Code available for payment source | Attending: Family Medicine | Admitting: Family Medicine

## 2019-07-12 ENCOUNTER — Ambulatory Visit (INDEPENDENT_AMBULATORY_CARE_PROVIDER_SITE_OTHER): Payer: No Typology Code available for payment source

## 2019-07-12 ENCOUNTER — Encounter (HOSPITAL_COMMUNITY): Payer: Self-pay | Admitting: Emergency Medicine

## 2019-07-12 DIAGNOSIS — J181 Lobar pneumonia, unspecified organism: Secondary | ICD-10-CM

## 2019-07-12 DIAGNOSIS — R0781 Pleurodynia: Secondary | ICD-10-CM

## 2019-07-12 DIAGNOSIS — R071 Chest pain on breathing: Secondary | ICD-10-CM | POA: Diagnosis not present

## 2019-07-12 DIAGNOSIS — J189 Pneumonia, unspecified organism: Secondary | ICD-10-CM

## 2019-07-12 MED ORDER — AMOXICILLIN-POT CLAVULANATE 875-125 MG PO TABS: 1.0000 | ORAL_TABLET | Freq: Two times a day (BID) | ORAL | 0 refills | Status: AC

## 2019-07-12 MED ORDER — AZITHROMYCIN 250 MG PO TABS: 250.0000 mg | ORAL_TABLET | Freq: Every day | ORAL | 0 refills | Status: DC

## 2019-07-12 NOTE — ED Provider Notes (Signed)
[place]    CSN: [id number] Arrival date & time: 07/12/19  1809      History   Chief Complaint Chief Complaint  Patient presents with  . Chest Pain    HPI [place] interpretation via family member Teneisha Shuler is a 68 y.o. female history of hypertension, hyperlipidemia, DM type II, presenting today for evaluation of left-sided rib pain.  Patient states that over the past week she has had some left-sided lower chest/rib cage discomfort.  Denies any fall direct blow or trauma to this area.  Symptoms have not improved over the past week, denies worsening.  Feels pain worsened with deep breaths.  Denies cough or shortness of breath.  Denies any URI symptoms.  Denies fevers chills body aches.  Denies nausea vomiting or abdominal pain.  Has taken some Advil without relief.Denies tobacco use.   HPI  Past Medical History:  Diagnosis Date  . Arthritis    knee  . Bronchitis   . Diabetes mellitus without complication ([place])    Several years.  . Hyperlipidemia 09/30/2015  . Hypertension   . Stomach pain    with diarrhea    Patient Active Problem List   Diagnosis Date Noted  . Chronic pain of both knees 03/29/2019  . Chronic lung disease 10/31/2015  . Hypertension 10/31/2015  . Diabetes mellitus type II, controlled ([place]) 09/30/2015  . Hyperlipidemia 09/30/2015    Past Surgical History:  Procedure Laterality Date  . COLONOSCOPY      OB History    Gravida  10   Para  9   Term  9   Preterm      AB  1   Living  9     SAB  1   TAB      Ectopic      Multiple      Live Births               Home Medications    Prior to Admission medications   Medication Sig Start Date End Date Taking? Authorizing Provider  acetic acid-hydrocortisone (VOSOL-HC) OTIC solution Place 3 drops into the left ear 3 (three) times daily. 05/08/19   Horald Pollen, MD  amLODipine (NORVASC) 10 MG tablet Take 1 tablet (10 mg total) by mouth daily. 05/24/19   Horald Pollen, MD  amoxicillin-clavulanate (AUGMENTIN) 875-125 MG tablet Take 1 tablet by mouth every 12 (twelve) hours for 7 days. 07/12/19 07/19/19  Amry Cathy C, PA-C  azithromycin (ZITHROMAX) 250 MG tablet Take 1 tablet (250 mg total) by mouth daily. Take first 2 tablets together, then 1 every day until finished. 07/12/19   Brinleigh Tew C, PA-C  glimepiride (AMARYL) 2 MG tablet TAKE ONE TABLET BY MOUTH WITH BREAKFAST 04/27/19   Corum, Rex Kras, MD  metFORMIN (GLUCOPHAGE-XR) 500 MG 24 hr tablet Take 1 tablet (500 mg total) by mouth 2 (two) times daily with a meal. 04/27/19   Corum, Rex Kras, MD  rosuvastatin (CRESTOR) 20 MG tablet Take 1 tablet (20 mg total) by mouth daily. 04/27/19   Corum, Rex Kras, MD    Family History Family History  Problem Relation Age of Onset  . Bronchitis Sister   . Hepatitis Daughter   . Hypertension Sister   . Stroke Sister   . Colon cancer Neg Hx     Social History Social History   Tobacco Use  . Smoking status: Never Smoker  . Smokeless tobacco: Never Used  . Tobacco comment: Chews cola  nut  Substance Use Topics  . Alcohol use: No    Alcohol/week: 0.0 standard drinks  . Drug use: No     Allergies   Patient has no known allergies.   Review of Systems Review of Systems  Constitutional: Negative for activity change, appetite change, chills, fatigue and fever.  HENT: Negative for congestion, ear pain, rhinorrhea, sinus pressure, sore throat and trouble swallowing.   Eyes: Negative for discharge and redness.  Respiratory: Negative for cough, chest tightness and shortness of breath.   Cardiovascular: Positive for chest pain.  Gastrointestinal: Negative for abdominal pain, diarrhea, nausea and vomiting.  Musculoskeletal: Negative for myalgias.  Skin: Negative for rash.  Neurological: Negative for dizziness, light-headedness and headaches.     Physical Exam Triage Vital Signs ED Triage Vitals  Enc Vitals Group     BP 07/12/19 1833 123/62      Pulse Rate 07/12/19 1833 78     Resp 07/12/19 1833 20     Temp 07/12/19 1833 98 F (36.7 C)     Temp Source 07/12/19 1833 Oral     SpO2 07/12/19 1833 94 %     Weight --      Height --      Head Circumference --      Peak Flow --      Pain Score 07/12/19 1829 10     Pain Loc --      Pain Edu? --      Excl. in [place]? --    No data found.  Updated Vital Signs BP 123/62 (BP Location: Right Arm)   Pulse 78   Temp 98 F (36.7 C) (Oral)   Resp 20   LMP 11/02/2010 (Approximate) Comment: postmenopausal  SpO2 94%   Visual Acuity Right Eye Distance:   Left Eye Distance:   Bilateral Distance:    Right Eye Near:   Left Eye Near:    Bilateral Near:     Physical Exam Vitals signs and nursing note reviewed.  Constitutional:      General: She is not in acute distress.    Appearance: She is well-developed.  HENT:     Head: Normocephalic and atraumatic.  Eyes:     Conjunctiva/sclera: Conjunctivae normal.  Neck:     Musculoskeletal: Neck supple.  Cardiovascular:     Rate and Rhythm: Normal rate and regular rhythm.     Heart sounds: No murmur.  Pulmonary:     Effort: Pulmonary effort is normal. No respiratory distress.     Breath sounds: Rales present.     Comments: Rales to bilateral bases, no coughing during visit, breathing comfortably at rest Abdominal:     Palpations: Abdomen is soft.     Tenderness: There is no abdominal tenderness.     Comments: Nontender to light and deep palpation throughout entire abdomen  Musculoskeletal:     Comments: Left lower rib cage with tenderness to palpation along rib line  Skin:    General: Skin is warm and dry.  Neurological:     Mental Status: She is alert.      UC Treatments / Results  Labs (all labs ordered are listed, but only abnormal results are displayed) Labs Reviewed - No data to display  EKG   Radiology Dg Chest 2 View  Result Date: 07/12/2019 CLINICAL DATA:  Left lower rib cage pain. EXAM: CHEST - 2 VIEW  COMPARISON:  None. FINDINGS: Normal heart size. No pleural effusion. Right lung clear. Ill-defined opacity within the lateral  left base which may represent atelectasis or pneumonia. Visualized osseous structures appear intact. IMPRESSION: 1. Ill-defined left base opacity which may represent atelectasis or pneumonia Electronically Signed   By: Kerby Moors M.D.   On: 07/12/2019 19:11    Procedures Procedures (including critical care time)  Medications Ordered in UC Medications - No data to display  Initial Impression / Assessment and Plan / UC Course  I have reviewed the triage vital signs and the nursing notes.  Pertinent labs & imaging results that were available during my care of the patient were reviewed by me and considered in my medical decision making (see chart for details).     X-ray suggestive of pneumonia.  Will initiate on Augmentin and azithromycin.  Tylenol to help with discomfort.EKG normal sinus rhythm, no acute signs of ischemia or infarction.. Follow up if not improving. Discussed strict return precautions. Patient verbalized understanding and is agreeable with plan.  Final Clinical Impressions(s) / UC Diagnoses   Final diagnoses:  Community acquired pneumonia of left lower lobe of lung ([place])  Pleuritic chest pain     Discharge Instructions     X-ray with pneumonia in left lower lung Begin Augmentin twice daily for the next week, take with food Take 2 tablets of azithromycin on day 1, 1 tablet for the following 4 days Tylenol and ibuprofen as needed for discomfort  Follow-up if symptoms not resolving or worsening    ED Prescriptions    Medication Sig Dispense Auth. Provider   amoxicillin-clavulanate (AUGMENTIN) 875-125 MG tablet Take 1 tablet by mouth every 12 (twelve) hours for 7 days. 14 tablet Nachelle Negrette C, PA-C   azithromycin (ZITHROMAX) 250 MG tablet Take 1 tablet (250 mg total) by mouth daily. Take first 2 tablets together, then 1 every day until  finished. 6 tablet Nena Hampe, [place] C, PA-C     Controlled Substance Prescriptions [place] Controlled Substance Registry consulted? Not Applicable   Janith Lima, [place] 07/12/19 1938

## 2019-07-12 NOTE — ED Triage Notes (Addendum)
Pain in left lower ribcage area.  Pain for one week.  No known injury. Pain occurs with deep breathing.  Denies any respiratory symptoms.   No fever.    Pain is staying steady, no better and no worse since onset

## 2019-07-12 NOTE — Discharge Instructions (Signed)
X-ray with pneumonia in left lower lung Begin Augmentin twice daily for the next week, take with food Take 2 tablets of azithromycin on day 1, 1 tablet for the following 4 days Tylenol and ibuprofen as needed for discomfort  Follow-up if symptoms not resolving or worsening

## 2019-07-30 ENCOUNTER — Ambulatory Visit (INDEPENDENT_AMBULATORY_CARE_PROVIDER_SITE_OTHER): Payer: No Typology Code available for payment source

## 2019-07-30 ENCOUNTER — Other Ambulatory Visit: Payer: Self-pay

## 2019-07-30 ENCOUNTER — Encounter (HOSPITAL_COMMUNITY): Payer: Self-pay

## 2019-07-30 ENCOUNTER — Ambulatory Visit (HOSPITAL_COMMUNITY)
Admission: EM | Admit: 2019-07-30 | Discharge: 2019-07-30 | Disposition: A | Discharge status: Home / Self Care | Payer: No Typology Code available for payment source | Attending: Emergency Medicine | Admitting: Emergency Medicine

## 2019-07-30 DIAGNOSIS — J181 Lobar pneumonia, unspecified organism: Secondary | ICD-10-CM | POA: Diagnosis not present

## 2019-07-30 DIAGNOSIS — J189 Pneumonia, unspecified organism: Secondary | ICD-10-CM

## 2019-07-30 DIAGNOSIS — R0781 Pleurodynia: Secondary | ICD-10-CM

## 2019-07-30 MED ORDER — ALBUTEROL SULFATE HFA 108 (90 BASE) MCG/ACT IN AERS: 2.0000 | INHALATION_SPRAY | RESPIRATORY_TRACT | 0 refills | Status: DC | PRN

## 2019-07-30 MED ORDER — LEVOFLOXACIN 750 MG PO TABS: 750.0000 mg | ORAL_TABLET | Freq: Every day | ORAL | 0 refills | Status: DC

## 2019-07-30 MED ORDER — AEROCHAMBER PLUS MISC: 2 refills | Status: DC

## 2019-07-30 MED ORDER — MELOXICAM 7.5 MG PO TABS: 7.5000 mg | ORAL_TABLET | Freq: Every day | ORAL | 0 refills | Status: AC

## 2019-07-30 NOTE — ED Triage Notes (Signed)
Pt is following up from previous visit on 07/24/19. Her symptoms has gotten worst and she is still having sharp pains in her lungs on the left side.

## 2019-07-30 NOTE — ED Provider Notes (Signed)
HPI  SUBJECTIVE:  Cheryl Hopkins is a 68 y.o. female who presents with persistent left lower rib pain described as dull, constant with a dry cough, she dyspnea on exertion and occasional shortness of breath.  She states that she is getting a little bit better, but is concerned because it has not fully resolved.  No fevers, wheezing, abdominal pain, nausea, vomiting.  No calf pain or swelling, hemoptysis, surgery in the past 4 weeks, recent immobilization.  No rash or bruising along her ribs.  No trauma.  She was seen here 2 weeks ago on 9/9, found to have a left lower lobe pneumonia, was treated with azithromycin and Augmentin which she states that she finished.  She has been taking ibuprofen 200 mg nightly for the pain with some improvement in her symptoms.  Symptoms are worse with palpation, lying on her left side, torso rotation.  She has a past medical history of hypertension, hypercholesterolemia, diabetes, no history of chronic kidney disease, asthma, emphysema, COPD, smoking, MI, PE, DVT.  PMD: None.  All history obtained through family member acting as an Astronomer.    Past Medical History:  Diagnosis Date  . Arthritis    knee  . Bronchitis   . Diabetes mellitus without complication ([place])    Several years.  . Hyperlipidemia 09/30/2015  . Hypertension   . Stomach pain    with diarrhea    Past Surgical History:  Procedure Laterality Date  . COLONOSCOPY      Family History  Problem Relation Age of Onset  . Bronchitis Sister   . Hepatitis Daughter   . Hypertension Sister   . Stroke Sister   . Colon cancer Neg Hx     Social History   Tobacco Use  . Smoking status: Never Smoker  . Smokeless tobacco: Never Used  . Tobacco comment: Chews cola nut  Substance Use Topics  . Alcohol use: No    Alcohol/week: 0.0 standard drinks  . Drug use: No    No current facility-administered medications for this encounter.   Current Outpatient Medications:  .  acetic  acid-hydrocortisone (VOSOL-HC) OTIC solution, Place 3 drops into the left ear 3 (three) times daily., Disp: 10 mL, Rfl: 1 .  albuterol (VENTOLIN HFA) 108 (90 Base) MCG/ACT inhaler, Inhale 2 puffs into the lungs every 4 (four) hours as needed for wheezing or shortness of breath. Dispense with aerochamber, Disp: 18 g, Rfl: 0 .  amLODipine (NORVASC) 10 MG tablet, Take 1 tablet (10 mg total) by mouth daily., Disp: 30 tablet, Rfl: 5 .  glimepiride (AMARYL) 2 MG tablet, TAKE ONE TABLET BY MOUTH WITH BREAKFAST, Disp: 30 tablet, Rfl: 0 .  levofloxacin (LEVAQUIN) 750 MG tablet, Take 1 tablet (750 mg total) by mouth daily. X 5 days, Disp: 5 tablet, Rfl: 0 .  meloxicam (MOBIC) 7.5 MG tablet, Take 1 tablet (7.5 mg total) by mouth daily for 10 days., Disp: 10 tablet, Rfl: 0 .  metFORMIN (GLUCOPHAGE-XR) 500 MG 24 hr tablet, Take 1 tablet (500 mg total) by mouth 2 (two) times daily with a meal., Disp: 120 tablet, Rfl: 0 .  rosuvastatin (CRESTOR) 20 MG tablet, Take 1 tablet (20 mg total) by mouth daily., Disp: 30 tablet, Rfl: 0 .  Spacer/Aero-Holding Chambers (AEROCHAMBER PLUS) inhaler, Use as instructed, Disp: 1 each, Rfl: 2  No Known Allergies   ROS  As noted in HPI.   Physical Exam  BP 119/65 (BP Location: Left Arm)   Pulse 81   Temp 97.8  F (36.6 C) (Skin)   Resp 16   LMP 11/02/2010 (Approximate) Comment: postmenopausal  SpO2 100%   Constitutional: Well developed, well nourished, no acute distress Eyes:  EOMI, conjunctiva normal bilaterally HENT: Normocephalic, atraumatic,mucus membranes moist Respiratory: Normal inspiratory effort.  Fine crackles at the base of the left lung.  Otherwise breath sounds clear.  No rhonchi.  Positive tenderness left anterior lower ribs.  No bruising, rash, crepitus. Cardiovascular: Normal rate, regular rhythm, no murmurs rubs or gallops GI: nondistended, normal appearance, soft, nontender, active bowel sounds.  No guarding or rebound.  No masses. skin: No rash,  skin intact Musculoskeletal: Calves symmetric, nontender, no edema. Neurologic: Alert & oriented x 3, no focal neuro deficits Psychiatric: Speech and behavior appropriate   ED Course   Medications - No data to display  Orders Placed This Encounter  Procedures  . DG Chest 2 View    Standing Status:   Standing    Number of Occurrences:   1    Order Specific Question:   Reason for Exam (SYMPTOM  OR DIAGNOSIS REQUIRED)    Answer:   recent L PNA still with L CP eval for resolution or other acute process    No results found for this or any previous visit (from the past 24 hour(s)). Dg Chest 2 View  Result Date: 07/30/2019 CLINICAL DATA:  Recent pneumonia, left-sided chest pain EXAM: CHEST - 2 VIEW COMPARISON:  07/12/2019 FINDINGS: Partial but incomplete resolution of vague left lung base opacity. No new opacity. No pleural effusion or pneumothorax. Stable cardiomediastinal silhouette. IMPRESSION: Partial but incomplete clearing of vague left lung base opacity. Otherwise no significant change. Electronically Signed   By: Donavan Foil M.D.   On: 07/30/2019 16:27    ED Clinical Impression  1. Community acquired pneumonia of left lower lobe of lung ([place])   2. Pleuritic chest pain      ED Assessment/Plan   Reviewed imaging independently.  Partial but incomplete clearing of vague left lung base opacity see radiology report for full details.  Previous EKG reviewed.  No prolonged QT.  While Patient states that she is getting better, she is still symptomatic, still has lung findings and has partial resolution of pneumonia on XR, will start her on Levaquin 750 mg for 5 days.  There is no other alternative as she has already completed Augmentin and azithromycin.  Confirm with family member that she was indeed prescribed 2 antibiotics on the first visit.  will also give her an albuterol inhaler with a spacer for her to use every 4-6 hours, stop ibuprofen and start meloxicam 7.5 mg daily.  Will  provide primary care list for ongoing follow-up, giving strict ER return precautions.  Ordering assistance in finding a primary care provider.  Discussed imaging, MDM, treatment plan, and plan for follow-up with family member and patient. Discussed sn/sx that should prompt return to the ED. they agree with plan.  Meds ordered this encounter  Medications  . DISCONTD: levofloxacin (LEVAQUIN) 750 MG tablet    Sig: Take 1 tablet (750 mg total) by mouth daily. X 5 days    Dispense:  5 tablet    Refill:  0  . albuterol (VENTOLIN HFA) 108 (90 Base) MCG/ACT inhaler    Sig: Inhale 2 puffs into the lungs every 4 (four) hours as needed for wheezing or shortness of breath. Dispense with aerochamber    Dispense:  18 g    Refill:  0  . Spacer/Aero-Holding Chambers (AEROCHAMBER PLUS) inhaler  Sig: Use as instructed    Dispense:  1 each    Refill:  2  . meloxicam (MOBIC) 7.5 MG tablet    Sig: Take 1 tablet (7.5 mg total) by mouth daily for 10 days.    Dispense:  10 tablet    Refill:  0  . levofloxacin (LEVAQUIN) 750 MG tablet    Sig: Take 1 tablet (750 mg total) by mouth daily. X 5 days    Dispense:  5 tablet    Refill:  0    *This clinic note was created using [employer]. Therefore, there may be occasional mistakes despite careful proofreading.   ?   Melynda Ripple, MD 07/30/19 [phone]

## 2019-07-30 NOTE — Discharge Instructions (Addendum)
Keep a close eye on her blood sugar.  The antibiotic that she is on may make her sugar become very low.  2 puffs from the albuterol inhaler using her spacer every 4-6 hours as needed for coughing, as of breath.  She needs to follow-up with a primary care provider as soon as possible for ongoing management of her diabetes and high blood pressure.  Do not take the Advil if you are taking the meloxicam.  The meloxicam is for pain.  You only need to take it once a day.  Give this another few weeks.  Below is a list of primary care practices who are taking new patients for you to follow-up with.  [facility] at [facility] [address] [place], [place] [address] [phone]  [place], [place] [address] [phone]  [facility] [phone] [place] [address]  [facility]: [place]  [phone]  [facility]: [place]   [phone]  [facility]: [address] [place]  [phone]  [facility] : [address] [place] [phone]  [facility]: [address] [place] [phone]  [facility]: [place] [phone]  Dr. Blanchie Serve [place]  [phone]  Dr. Benito Mccreedy, [facility]. [place],  [address]  [phone]  Go to www.goodrx.com to look up your medications. This will give you a list of where you can find your prescriptions at the most affordable prices. Or ask the pharmacist what  the cash price is, or if they have any other discount programs available to help make your medication more affordable. This can be less expensive than what you would pay with insurance.

## 2019-08-02 ENCOUNTER — Other Ambulatory Visit: Payer: Self-pay

## 2019-08-02 DIAGNOSIS — E119 Type 2 diabetes mellitus without complications: Secondary | ICD-10-CM

## 2019-08-02 MED ORDER — GLIMEPIRIDE 2 MG PO TABS: ORAL_TABLET | ORAL | 0 refills | Status: DC

## 2019-08-14 ENCOUNTER — Other Ambulatory Visit: Payer: Self-pay

## 2019-08-14 ENCOUNTER — Encounter: Payer: Self-pay | Admitting: Emergency Medicine

## 2019-08-14 ENCOUNTER — Ambulatory Visit (INDEPENDENT_AMBULATORY_CARE_PROVIDER_SITE_OTHER): Payer: No Typology Code available for payment source | Admitting: Emergency Medicine

## 2019-08-14 VITALS — BP 115/68 | HR 70 | Temp 98.5°F | Resp 16 | Ht 68.0 in | Wt 156.6 lb

## 2019-08-14 DIAGNOSIS — Z8701 Personal history of pneumonia (recurrent): Secondary | ICD-10-CM

## 2019-08-14 DIAGNOSIS — Z9189 Other specified personal risk factors, not elsewhere classified: Secondary | ICD-10-CM | POA: Diagnosis not present

## 2019-08-14 DIAGNOSIS — E1169 Type 2 diabetes mellitus with other specified complication: Secondary | ICD-10-CM | POA: Diagnosis not present

## 2019-08-14 DIAGNOSIS — I10 Essential (primary) hypertension: Secondary | ICD-10-CM | POA: Diagnosis not present

## 2019-08-14 DIAGNOSIS — E785 Hyperlipidemia, unspecified: Secondary | ICD-10-CM

## 2019-08-14 DIAGNOSIS — Z23 Encounter for immunization: Secondary | ICD-10-CM | POA: Diagnosis not present

## 2019-08-14 DIAGNOSIS — E7800 Pure hypercholesterolemia, unspecified: Secondary | ICD-10-CM

## 2019-08-14 DIAGNOSIS — E1159 Type 2 diabetes mellitus with other circulatory complications: Secondary | ICD-10-CM | POA: Diagnosis not present

## 2019-08-14 MED ORDER — ALBUTEROL SULFATE HFA 108 (90 BASE) MCG/ACT IN AERS: 2.0000 | INHALATION_SPRAY | RESPIRATORY_TRACT | 2 refills | Status: DC | PRN

## 2019-08-14 MED ORDER — ROSUVASTATIN CALCIUM 20 MG PO TABS: 20.0000 mg | ORAL_TABLET | Freq: Every day | ORAL | 1 refills | Status: DC

## 2019-08-14 MED ORDER — METFORMIN HCL ER 500 MG PO TB24: 500.0000 mg | ORAL_TABLET | Freq: Two times a day (BID) | ORAL | 1 refills | Status: DC

## 2019-08-14 MED ORDER — AMLODIPINE BESYLATE 10 MG PO TABS: 10.0000 mg | ORAL_TABLET | Freq: Every day | ORAL | 1 refills | Status: DC

## 2019-08-14 MED ORDER — BLOOD GLUCOSE MONITORING SUPPL W/DEVICE KIT: 1.0000 | PACK | Freq: Every day | 0 refills | Status: DC

## 2019-08-14 MED ORDER — GLIMEPIRIDE 2 MG PO TABS: 2.0000 mg | ORAL_TABLET | Freq: Every day | ORAL | 1 refills | Status: DC

## 2019-08-14 NOTE — Progress Notes (Signed)
Lab Results  Component Value Date   HGBA1C 7.1 (H) 11/05/2016   BP Readings from Last 3 Encounters:  07/30/19 119/65  07/12/19 123/62  05/08/19 110/66   Lab Results  Component Value Date   CHOL 340 (H) 11/05/2016   HDL 65 11/05/2016   LDLCALC 252 (H) 11/05/2016   TRIG 115 11/05/2016   CHOLHDL 5.2 (H) 11/05/2016   Lab Results  Component Value Date   CREATININE 0.54 (L) 11/05/2016   BUN 9 11/05/2016   NA 142 11/05/2016   K 4.1 11/05/2016   CL 97 11/05/2016   CO2 27 11/05/2016   Cheryl Hopkins 68 y.o.   Chief Complaint  Patient presents with  . Medication Refill    amlodipine, glimepiride, crestor and metformin  . Pneumonia    follow up 07/30/2019    HISTORY OF PRESENT ILLNESS: This is a 68 y.o. female with history of hypertension, diabetes, and dyslipidemia here for follow-up and medication refill. Also here for follow-up of pneumonia, seen in the emergency room on 07/30/2019.  Seen in the emergency room and released home the same day.  Has been on antibiotics.  Was not tested for COVID.  Doing better.  Has no complaints.  Denies fever or chills, cough, or difficulty breathing.  Son helping with translation.  Originally from [place].   CLINICAL DATA:  Recent pneumonia, left-sided chest pain  EXAM: CHEST - 2 VIEW  COMPARISON:  07/12/2019  FINDINGS: Partial but incomplete resolution of vague left lung base opacity. No new opacity. No pleural effusion or pneumothorax. Stable cardiomediastinal silhouette.  IMPRESSION: Partial but incomplete clearing of vague left lung base opacity. Otherwise no significant change.   Electronically Signed   By: Donavan Foil M.D.   On: 07/30/2019 16:27  HPI   Prior to Admission medications   Medication Sig Start Date End Date Taking? Authorizing Provider  acetic acid-hydrocortisone (VOSOL-HC) OTIC solution Place 3 drops into the left ear 3 (three) times daily. 05/08/19  Yes Marica Trentham, Ines Bloomer, MD  albuterol (VENTOLIN  HFA) 108 (90 Base) MCG/ACT inhaler Inhale 2 puffs into the lungs every 4 (four) hours as needed for wheezing or shortness of breath. Dispense with aerochamber 07/30/19  Yes Melynda Ripple, MD  amLODipine (NORVASC) 10 MG tablet Take 1 tablet (10 mg total) by mouth daily. 05/24/19  Yes Sidnie Swalley, Ines Bloomer, MD  glimepiride (AMARYL) 2 MG tablet TAKE ONE TABLET BY MOUTH WITH BREAKFAST 08/02/19  Yes Horald Pollen, MD  metFORMIN (GLUCOPHAGE-XR) 500 MG 24 hr tablet Take 1 tablet (500 mg total) by mouth 2 (two) times daily with a meal. 04/27/19  Yes Corum, Rex Kras, MD  rosuvastatin (CRESTOR) 20 MG tablet Take 1 tablet (20 mg total) by mouth daily. 04/27/19  Yes Corum, Rex Kras, MD  levofloxacin (LEVAQUIN) 750 MG tablet Take 1 tablet (750 mg total) by mouth daily. X 5 days Patient not taking: Reported on 08/14/2019 07/30/19   Melynda Ripple, MD  Spacer/Aero-Holding Chambers (AEROCHAMBER PLUS) inhaler Use as instructed 07/30/19   Melynda Ripple, MD    No Known Allergies  Patient Active Problem List   Diagnosis Date Noted  . Chronic pain of both knees 03/29/2019  . Chronic lung disease 10/31/2015  . Hypertension 10/31/2015  . Diabetes mellitus type II, controlled ([place]) 09/30/2015  . Hyperlipidemia 09/30/2015    Past Medical History:  Diagnosis Date  . Arthritis    knee  . Bronchitis   . Diabetes mellitus without complication ([place])    Several years.  Marland Kitchen  Hyperlipidemia 09/30/2015  . Hypertension   . Stomach pain    with diarrhea    Past Surgical History:  Procedure Laterality Date  . COLONOSCOPY      Social History   Socioeconomic History  . Marital status: Widowed    Spouse name: Widowed since 2010  . Number of children: 9  . Years of education: <8  . Highest education level: Not on file  Occupational History  . Occupation: Housewife  Social Needs  . Financial resource strain: Not on file  . Food insecurity    Worry: Not on file    Inability: Not on file  .  Transportation needs    Medical: Not on file    Non-medical: Not on file  Tobacco Use  . Smoking status: Never Smoker  . Smokeless tobacco: Never Used  . Tobacco comment: Chews cola nut  Substance and Sexual Activity  . Alcohol use: No    Alcohol/week: 0.0 standard drinks  . Drug use: No  . Sexual activity: Not Currently  Lifestyle  . Physical activity    Days per week: Not on file    Minutes per session: Not on file  . Stress: Not on file  Relationships  . Social Herbalist on phone: Not on file    Gets together: Not on file    Attends religious service: Not on file    Active member of club or organization: Not on file    Attends meetings of clubs or organizations: Not on file    Relationship status: Not on file  . Intimate partner violence    Fear of current or ex partner: Not on file    Emotionally abused: Not on file    Physically abused: Not on file    Forced sexual activity: Not on file  Other Topics Concern  . Not on file  Social History Narrative      Widow   Originally from [place] - Came to [place]. In 2016   Lives with son and his wife and their 3 children.        Exercise - no exercise      Seatbelt - 100%   Gun in home - no    Family History  Problem Relation Age of Onset  . Bronchitis Sister   . Hepatitis Daughter   . Hypertension Sister   . Stroke Sister   . Colon cancer Neg Hx      Review of Systems  Constitutional: Negative.  Negative for chills and fever.  HENT: Negative.  Negative for congestion and sore throat.   Respiratory: Negative.  Negative for cough and shortness of breath.   Cardiovascular: Negative.  Negative for chest pain and palpitations.  Gastrointestinal: Negative.  Negative for abdominal pain, diarrhea, nausea and vomiting.  Musculoskeletal: Negative.  Negative for myalgias.  Skin: Negative.  Negative for rash.  Neurological: Negative for dizziness and headaches.  All other systems reviewed and are negative.      Vitals:   08/14/19 1204  BP: 115/68  Pulse: 70  Resp: 16  Temp: 98.5 F (36.9 C)  SpO2: 94%    Physical Exam Vitals signs reviewed.  Constitutional:      Appearance: Normal appearance.  HENT:     Head: Normocephalic.  Eyes:     Extraocular Movements: Extraocular movements intact.     Pupils: Pupils are equal, round, and reactive to light.  Neck:     Musculoskeletal: Normal range of motion and neck  supple.  Cardiovascular:     Rate and Rhythm: Normal rate and regular rhythm.     Pulses: Normal pulses.     Heart sounds: Normal heart sounds.  Pulmonary:     Effort: Pulmonary effort is normal.     Breath sounds: Rales (Dry inspiratory crackles diffusely in both lungs) present.  Musculoskeletal: Normal range of motion.  Skin:    General: Skin is warm and dry.     Capillary Refill: Capillary refill takes less than 2 seconds.  Neurological:     General: No focal deficit present.     Mental Status: She is alert and oriented to person, place, and time.  Psychiatric:        Mood and Affect: Mood normal.        Behavior: Behavior normal.      ASSESSMENT & PLAN: Cheryl Hopkins was seen today for medication refill and pneumonia.  Diagnoses and all orders for this visit:  Hypertension associated with diabetes ([place]) -     Lipid panel -     Comprehensive metabolic panel -     Hemoglobin A1c -     CBC with Differential/Platelet -     HM Diabetes Foot Exam -     amLODipine (NORVASC) 10 MG tablet; Take 1 tablet (10 mg total) by mouth daily. -     glimepiride (AMARYL) 2 MG tablet; Take 1 tablet (2 mg total) by mouth daily with breakfast. TAKE ONE TABLET BY MOUTH WITH BREAKFAST -     metFORMIN (GLUCOPHAGE-XR) 500 MG 24 hr tablet; Take 1 tablet (500 mg total) by mouth 2 (two) times daily with a meal.  Dyslipidemia associated with type 2 diabetes mellitus ([facility]) -     rosuvastatin (CRESTOR) 20 MG tablet; Take 1 tablet (20 mg total) by mouth daily.  At increased risk of exposure to  COVID-19 virus -     SAR CoV2 Serology (COVID 19)AB(IGG)IA  Essential hypertension  Pure hypercholesterolemia  History of recent pneumonia  Need for influenza vaccination -     Flu Vaccine QUAD 36+ mos IM  Other orders -     albuterol (VENTOLIN HFA) 108 (90 Base) MCG/ACT inhaler; Inhale 2 puffs into the lungs every 4 (four) hours as needed for wheezing or shortness of breath. Dispense with aerochamber    Patient Instructions       If you have lab work done today you will be contacted with your lab results within the next 2 weeks.  If you have not heard from [place] then please contact us. The fastest way to get your results is to register for My Chart.   IF you received an x-ray today, you will receive an invoice from [facility]. Please contact [facility] at [phone] with questions or concerns regarding your invoice.   IF you received labwork today, you will receive an invoice from [place]. Please contact [facility] at [phone] with questions or concerns regarding your invoice.   Our billing staff will not be able to assist you with questions regarding bills from these companies.  You will be contacted with the lab results as soon as they are available. The fastest way to get your results is to activate your My Chart account. Instructions are located on the last page of this paperwork. If you have not heard from [place] regarding the results in 2 weeks, please contact this office.     Health Maintenance After Age 5 After age 81, you are at a higher risk for certain  long-term diseases and infections as well as injuries from falls. Falls are a major cause of broken bones and head injuries in people who are older than age 63. Getting regular preventive care can help to keep you healthy and well. Preventive care includes getting regular testing and making lifestyle changes as recommended by your health care provider. Talk with your health care provider about:   Which screenings and tests you should have. A screening is a test that checks for a disease when you have no symptoms.  A diet and exercise plan that is right for you. What should I know about screenings and tests to prevent falls? Screening and testing are the best ways to find a health problem early. Early diagnosis and treatment give you the best chance of managing medical conditions that are common after age 66. Certain conditions and lifestyle choices may make you more likely to have a fall. Your health care provider may recommend:  Regular vision checks. Poor vision and conditions such as cataracts can make you more likely to have a fall. If you wear glasses, make sure to get your prescription updated if your vision changes.  Medicine review. Work with your health care provider to regularly review all of the medicines you are taking, including over-the-counter medicines. Ask your health care provider about any side effects that may make you more likely to have a fall. Tell your health care provider if any medicines that you take make you feel dizzy or sleepy.  Osteoporosis screening. Osteoporosis is a condition that causes the bones to get weaker. This can make the bones weak and cause them to break more easily.  Blood pressure screening. Blood pressure changes and medicines to control blood pressure can make you feel dizzy.  Strength and balance checks. Your health care provider may recommend certain tests to check your strength and balance while standing, walking, or changing positions.  Foot health exam. Foot pain and numbness, as well as not wearing proper footwear, can make you more likely to have a fall.  Depression screening. You may be more likely to have a fall if you have a fear of falling, feel emotionally low, or feel unable to do activities that you used to do.  Alcohol use screening. Using too much alcohol can affect your balance and may make you more likely to have a fall.  What actions can I take to lower my risk of falls? General instructions  Talk with your health care provider about your risks for falling. Tell your health care provider if: ? You fall. Be sure to tell your health care provider about all falls, even ones that seem minor. ? You feel dizzy, sleepy, or off-balance.  Take over-the-counter and prescription medicines only as told by your health care provider. These include any supplements.  Eat a healthy diet and maintain a healthy weight. A healthy diet includes low-fat dairy products, low-fat (lean) meats, and fiber from whole grains, beans, and lots of fruits and vegetables. Home safety  Remove any tripping hazards, such as rugs, cords, and clutter.  Install safety equipment such as grab bars in bathrooms and safety rails on stairs.  Keep rooms and walkways well-lit. Activity   Follow a regular exercise program to stay fit. This will help you maintain your balance. Ask your health care provider what types of exercise are appropriate for you.  If you need a cane or walker, use it as recommended by your health care provider.  Wear supportive shoes that  have nonskid soles. Lifestyle  Do not drink alcohol if your health care provider tells you not to drink.  If you drink alcohol, limit how much you have: ? 0-1 drink a day for women. ? 0-2 drinks a day for men.  Be aware of how much alcohol is in your drink. In the [place] one drink equals one typical bottle of beer (12 oz), one-half glass of wine (5 oz), or one shot of hard liquor (1 oz).  Do not use any products that contain nicotine or tobacco, such as cigarettes and e-cigarettes. If you need help quitting, ask your health care provider. Summary  Having a healthy lifestyle and getting preventive care can help to protect your health and wellness after age 89.  Screening and testing are the best way to find a health problem early and help you avoid having a fall. Early diagnosis and  treatment give you the best chance for managing medical conditions that are more common for people who are older than age 4.  Falls are a major cause of broken bones and head injuries in people who are older than age 66. Take precautions to prevent a fall at home.  Work with your health care provider to learn what changes you can make to improve your health and wellness and to prevent falls. This information is not intended to replace advice given to you by your health care provider. Make sure you discuss any questions you have with your health care provider. Document Released: 09/01/2017 Document Revised: 02/09/2019 Document Reviewed: 09/01/2017 Elsevier Patient Education  2020 Elsevier Inc.      Agustina Caroli, MD [facility]

## 2019-08-14 NOTE — Patient Instructions (Addendum)
   If you have lab work done today you will be contacted with your lab results within the next 2 weeks.  If you have not heard from us then please contact us. The fastest way to get your results is to register for My Chart.   IF you received an x-ray today, you will receive an invoice from [facility]. Please contact [facility] at [phone] with questions or concerns regarding your invoice.   IF you received labwork today, you will receive an invoice from [facility]. Please contact [facility] at [phone] with questions or concerns regarding your invoice.   Our billing staff will not be able to assist you with questions regarding bills from these companies.  You will be contacted with the lab results as soon as they are available. The fastest way to get your results is to activate your My Chart account. Instructions are located on the last page of this paperwork. If you have not heard from us regarding the results in 2 weeks, please contact this office.     Health Maintenance After Age 65 After age 65, you are at a higher risk for certain long-term diseases and infections as well as injuries from falls. Falls are a major cause of broken bones and head injuries in people who are older than age 65. Getting regular preventive care can help to keep you healthy and well. Preventive care includes getting regular testing and making lifestyle changes as recommended by your health care provider. Talk with your health care provider about:  Which screenings and tests you should have. A screening is a test that checks for a disease when you have no symptoms.  A diet and exercise plan that is right for you. What should I know about screenings and tests to prevent falls? Screening and testing are the best ways to find a health problem early. Early diagnosis and treatment give you the best chance of managing medical conditions that are common after age 65. Certain conditions and  lifestyle choices may make you more likely to have a fall. Your health care provider may recommend:  Regular vision checks. Poor vision and conditions such as cataracts can make you more likely to have a fall. If you wear glasses, make sure to get your prescription updated if your vision changes.  Medicine review. Work with your health care provider to regularly review all of the medicines you are taking, including over-the-counter medicines. Ask your health care provider about any side effects that may make you more likely to have a fall. Tell your health care provider if any medicines that you take make you feel dizzy or sleepy.  Osteoporosis screening. Osteoporosis is a condition that causes the bones to get weaker. This can make the bones weak and cause them to break more easily.  Blood pressure screening. Blood pressure changes and medicines to control blood pressure can make you feel dizzy.  Strength and balance checks. Your health care provider may recommend certain tests to check your strength and balance while standing, walking, or changing positions.  Foot health exam. Foot pain and numbness, as well as not wearing proper footwear, can make you more likely to have a fall.  Depression screening. You may be more likely to have a fall if you have a fear of falling, feel emotionally low, or feel unable to do activities that you used to do.  Alcohol use screening. Using too much alcohol can affect your balance and may make you more likely to   have a fall. What actions can I take to lower my risk of falls? General instructions  Talk with your health care provider about your risks for falling. Tell your health care provider if: ? You fall. Be sure to tell your health care provider about all falls, even ones that seem minor. ? You feel dizzy, sleepy, or off-balance.  Take over-the-counter and prescription medicines only as told by your health care provider. These include any  supplements.  Eat a healthy diet and maintain a healthy weight. A healthy diet includes low-fat dairy products, low-fat (lean) meats, and fiber from whole grains, beans, and lots of fruits and vegetables. Home safety  Remove any tripping hazards, such as rugs, cords, and clutter.  Install safety equipment such as grab bars in bathrooms and safety rails on stairs.  Keep rooms and walkways well-lit. Activity   Follow a regular exercise program to stay fit. This will help you maintain your balance. Ask your health care provider what types of exercise are appropriate for you.  If you need a cane or walker, use it as recommended by your health care provider.  Wear supportive shoes that have nonskid soles. Lifestyle  Do not drink alcohol if your health care provider tells you not to drink.  If you drink alcohol, limit how much you have: ? 0-1 drink a day for women. ? 0-2 drinks a day for men.  Be aware of how much alcohol is in your drink. In the [place] one drink equals one typical bottle of beer (12 oz), one-half glass of wine (5 oz), or one shot of hard liquor (1 oz).  Do not use any products that contain nicotine or tobacco, such as cigarettes and e-cigarettes. If you need help quitting, ask your health care provider. Summary  Having a healthy lifestyle and getting preventive care can help to protect your health and wellness after age 65.  Screening and testing are the best way to find a health problem early and help you avoid having a fall. Early diagnosis and treatment give you the best chance for managing medical conditions that are more common for people who are older than age 65.  Falls are a major cause of broken bones and head injuries in people who are older than age 65. Take precautions to prevent a fall at home.  Work with your health care provider to learn what changes you can make to improve your health and wellness and to prevent falls. This information is not intended  to replace advice given to you by your health care provider. Make sure you discuss any questions you have with your health care provider. Document Released: 09/01/2017 Document Revised: 02/09/2019 Document Reviewed: 09/01/2017 Elsevier Patient Education  2020 Elsevier Inc.  

## 2019-08-14 NOTE — Addendum Note (Signed)
Addended by: Alfredia Ferguson A on: 08/14/2019 05:36 PM   Modules accepted: Orders

## 2019-08-15 ENCOUNTER — Ambulatory Visit: Payer: Self-pay | Admitting: Emergency Medicine

## 2019-08-15 LAB — COMPREHENSIVE METABOLIC PANEL
ALT: 9 IU/L (ref 0–32)
AST: 11 IU/L (ref 0–40)
Albumin/Globulin Ratio: 1.4 (ref 1.2–2.2)
Albumin: 4.3 g/dL (ref 3.8–4.8)
Alkaline Phosphatase: 93 IU/L (ref 39–117)
BUN/Creatinine Ratio: 24 (ref 12–28)
BUN: 13 mg/dL (ref 8–27)
Bilirubin Total: 0.2 mg/dL (ref 0.0–1.2)
CO2: 28 mmol/L (ref 20–29)
Calcium: 9.5 mg/dL (ref 8.7–10.3)
Chloride: 101 mmol/L (ref 96–106)
Creatinine, Ser: 0.55 mg/dL — ABNORMAL LOW (ref 0.57–1.00)
GFR calc Af Amer: 113 mL/min/{1.73_m2} (ref >59)
GFR calc non Af Amer: 98 mL/min/{1.73_m2} (ref >59)
Globulin, Total: 3.1 g/dL (ref 1.5–4.5)
Glucose: 111 mg/dL — ABNORMAL HIGH (ref 65–99)
Potassium: 4 mmol/L (ref 3.5–5.2)
Sodium: 143 mmol/L (ref 134–144)
Total Protein: 7.4 g/dL (ref 6.0–8.5)

## 2019-08-15 LAB — LIPID PANEL
Chol/HDL Ratio: 5.2 ratio — ABNORMAL HIGH (ref 0.0–4.4)
Cholesterol, Total: 317 mg/dL — ABNORMAL HIGH (ref 100–199)
HDL: 61 mg/dL (ref >39)
LDL Chol Calc (NIH): 238 mg/dL — ABNORMAL HIGH (ref 0–99)
Triglycerides: 107 mg/dL (ref 0–149)
VLDL Cholesterol Cal: 18 mg/dL (ref 5–40)

## 2019-08-15 LAB — CBC WITH DIFFERENTIAL/PLATELET
Basophils Absolute: 0.1 10*3/uL (ref 0.0–0.2)
Basos: 1 %
EOS (ABSOLUTE): 0.4 10*3/uL (ref 0.0–0.4)
Eos: 6 %
Hematocrit: 43.4 % (ref 34.0–46.6)
Hemoglobin: 14.3 g/dL (ref 11.1–15.9)
Immature Grans (Abs): 0 10*3/uL (ref 0.0–0.1)
Immature Granulocytes: 0 %
Lymphocytes Absolute: 2.8 10*3/uL (ref 0.7–3.1)
Lymphs: 41 %
MCH: 28.1 pg (ref 26.6–33.0)
MCHC: 32.9 g/dL (ref 31.5–35.7)
MCV: 85 fL (ref 79–97)
Monocytes Absolute: 0.5 10*3/uL (ref 0.1–0.9)
Monocytes: 7 %
Neutrophils Absolute: 3.1 10*3/uL (ref 1.4–7.0)
Neutrophils: 45 %
Platelets: 390 10*3/uL (ref 150–450)
RBC: 5.09 x10E6/uL (ref 3.77–5.28)
RDW: 13 % (ref 11.7–15.4)
WBC: 6.8 10*3/uL (ref 3.4–10.8)

## 2019-08-15 LAB — EUROIMMUN SARS-COV-2 AB, IGG: Euroimmun SARS-CoV-2 Ab, IgG: NEGATIVE

## 2019-08-15 LAB — HEMOGLOBIN A1C
Est. average glucose Bld gHb Est-mCnc: 151 mg/dL
Hgb A1c MFr Bld: 6.9 % — ABNORMAL HIGH (ref 4.8–5.6)

## 2019-08-15 LAB — SAR COV2 SEROLOGY (COVID19)AB(IGG),IA

## 2019-08-21 ENCOUNTER — Telehealth: Payer: Self-pay | Admitting: Emergency Medicine

## 2019-08-21 NOTE — Telephone Encounter (Signed)
Copied from [facility] [id number]. Topic: General - Other >> Aug 21, 2019 12:31 PM Rainey Pines A wrote: Patients son called back in regards to getting patients lab results. Best contact number [phone]

## 2019-08-22 ENCOUNTER — Telehealth: Payer: Self-pay | Admitting: Emergency Medicine

## 2019-08-22 NOTE — Telephone Encounter (Signed)
Pt son would like to come to office to pick up a copy of his mother blood work results. Pt will be going to [place] for 6 month

## 2019-08-23 ENCOUNTER — Telehealth: Payer: Self-pay | Admitting: *Deleted

## 2019-08-23 NOTE — Telephone Encounter (Signed)
Faxed signed forms to [employer] to [employer]. Confirmation page 2:27 pm.

## 2019-08-30 NOTE — Telephone Encounter (Signed)
Spoke to patient's son Berna Spare (on [employer]), lab results are ready for pick up. I reviewed the lab results with message from Dr Mitchel Honour, his mother needs to follow up the appointment 03/05/2020.

## 2020-03-05 ENCOUNTER — Ambulatory Visit (INDEPENDENT_AMBULATORY_CARE_PROVIDER_SITE_OTHER): Payer: Self-pay | Admitting: Emergency Medicine

## 2020-03-05 ENCOUNTER — Encounter: Payer: Self-pay | Admitting: Emergency Medicine

## 2020-03-05 ENCOUNTER — Other Ambulatory Visit: Payer: Self-pay

## 2020-03-05 ENCOUNTER — Ambulatory Visit (INDEPENDENT_AMBULATORY_CARE_PROVIDER_SITE_OTHER): Payer: Self-pay

## 2020-03-05 VITALS — BP 128/73 | HR 71 | Temp 97.3°F | Resp 16 | Ht 68.0 in | Wt 153.0 lb

## 2020-03-05 DIAGNOSIS — R911 Solitary pulmonary nodule: Secondary | ICD-10-CM | POA: Insufficient documentation

## 2020-03-05 DIAGNOSIS — R0600 Dyspnea, unspecified: Secondary | ICD-10-CM

## 2020-03-05 DIAGNOSIS — Z1231 Encounter for screening mammogram for malignant neoplasm of breast: Secondary | ICD-10-CM

## 2020-03-05 DIAGNOSIS — R0609 Other forms of dyspnea: Secondary | ICD-10-CM

## 2020-03-05 DIAGNOSIS — I10 Essential (primary) hypertension: Secondary | ICD-10-CM

## 2020-03-05 DIAGNOSIS — E1159 Type 2 diabetes mellitus with other circulatory complications: Secondary | ICD-10-CM

## 2020-03-05 DIAGNOSIS — E1169 Type 2 diabetes mellitus with other specified complication: Secondary | ICD-10-CM

## 2020-03-05 DIAGNOSIS — E785 Hyperlipidemia, unspecified: Secondary | ICD-10-CM

## 2020-03-05 LAB — POCT GLYCOSYLATED HEMOGLOBIN (HGB A1C): Hemoglobin A1C: 8.2 % — AB (ref 4.0–5.6)

## 2020-03-05 LAB — GLUCOSE, POCT (MANUAL RESULT ENTRY): POC Glucose: 144 mg/dl — AB (ref 70–99)

## 2020-03-05 MED ORDER — GLIMEPIRIDE 2 MG PO TABS: 2.0000 mg | ORAL_TABLET | Freq: Every day | ORAL | 3 refills | Status: DC

## 2020-03-05 MED ORDER — AMLODIPINE BESYLATE 10 MG PO TABS: 10.0000 mg | ORAL_TABLET | Freq: Every day | ORAL | 3 refills | Status: DC

## 2020-03-05 MED ORDER — METFORMIN HCL ER 500 MG PO TB24: 500.0000 mg | ORAL_TABLET | Freq: Two times a day (BID) | ORAL | 3 refills | Status: DC

## 2020-03-05 MED ORDER — SITAGLIPTIN PHOSPHATE 50 MG PO TABS: 50.0000 mg | ORAL_TABLET | Freq: Every day | ORAL | 3 refills | Status: DC

## 2020-03-05 MED ORDER — ROSUVASTATIN CALCIUM 20 MG PO TABS: 20.0000 mg | ORAL_TABLET | Freq: Every day | ORAL | 3 refills | Status: DC

## 2020-03-05 NOTE — Patient Instructions (Addendum)
   If you have lab work done today you will be contacted with your lab results within the next 2 weeks.  If you have not heard from us then please contact us. The fastest way to get your results is to register for My Chart.   IF you received an x-ray today, you will receive an invoice from [facility]. Please contact  Radiology at [phone] with questions or concerns regarding your invoice.   IF you received labwork today, you will receive an invoice from [facility]. Please contact [facility] at [phone] with questions or concerns regarding your invoice.   Our billing staff will not be able to assist you with questions regarding bills from these companies.  You will be contacted with the lab results as soon as they are available. The fastest way to get your results is to activate your My Chart account. Instructions are located on the last page of this paperwork. If you have not heard from us regarding the results in 2 weeks, please contact this office.     Diabetes Mellitus and Nutrition, Adult When you have diabetes (diabetes mellitus), it is very important to have healthy eating habits because your blood sugar (glucose) levels are greatly affected by what you eat and drink. Eating healthy foods in the appropriate amounts, at about the same times every day, can help you:  Control your blood glucose.  Lower your risk of heart disease.  Improve your blood pressure.  Reach or maintain a healthy weight. Every person with diabetes is different, and each person has different needs for a meal plan. Your health care provider may recommend that you work with a diet and nutrition specialist (dietitian) to make a meal plan that is best for you. Your meal plan may vary depending on factors such as:  The calories you need.  The medicines you take.  Your weight.  Your blood glucose, blood pressure, and cholesterol levels.  Your activity level.  Other health conditions  you have, such as heart or kidney disease. How do carbohydrates affect me? Carbohydrates, also called carbs, affect your blood glucose level more than any other type of food. Eating carbs naturally raises the amount of glucose in your blood. Carb counting is a method for keeping track of how many carbs you eat. Counting carbs is important to keep your blood glucose at a healthy level, especially if you use insulin or take certain oral diabetes medicines. It is important to know how many carbs you can safely have in each meal. This is different for every person. Your dietitian can help you calculate how many carbs you should have at each meal and for each snack. Foods that contain carbs include:  Bread, cereal, rice, pasta, and crackers.  Potatoes and corn.  Peas, beans, and lentils.  Milk and yogurt.  Fruit and juice.  Desserts, such as cakes, cookies, ice cream, and candy. How does alcohol affect me? Alcohol can cause a sudden decrease in blood glucose (hypoglycemia), especially if you use insulin or take certain oral diabetes medicines. Hypoglycemia can be a life-threatening condition. Symptoms of hypoglycemia (sleepiness, dizziness, and confusion) are similar to symptoms of having too much alcohol. If your health care provider says that alcohol is safe for you, follow these guidelines:  Limit alcohol intake to no more than 1 drink per day for nonpregnant women and 2 drinks per day for men. One drink equals 12 oz of beer, 5 oz of wine, or 1 oz of hard liquor.    Do not drink on an empty stomach.  Keep yourself hydrated with water, diet soda, or unsweetened iced tea.  Keep in mind that regular soda, juice, and other mixers may contain a lot of sugar and must be counted as carbs. What are tips for following this plan?  Reading food labels  Start by checking the serving size on the "Nutrition Facts" label of packaged foods and drinks. The amount of calories, carbs, fats, and other  nutrients listed on the label is based on one serving of the item. Many items contain more than one serving per package.  Check the total grams (g) of carbs in one serving. You can calculate the number of servings of carbs in one serving by dividing the total carbs by 15. For example, if a food has 30 g of total carbs, it would be equal to 2 servings of carbs.  Check the number of grams (g) of saturated and trans fats in one serving. Choose foods that have low or no amount of these fats.  Check the number of milligrams (mg) of salt (sodium) in one serving. Most people should limit total sodium intake to less than 2,300 mg per day.  Always check the nutrition information of foods labeled as "low-fat" or "nonfat". These foods may be higher in added sugar or refined carbs and should be avoided.  Talk to your dietitian to identify your daily goals for nutrients listed on the label. Shopping  Avoid buying canned, premade, or processed foods. These foods tend to be high in fat, sodium, and added sugar.  Shop around the outside edge of the grocery store. This includes fresh fruits and vegetables, bulk grains, fresh meats, and fresh dairy. Cooking  Use low-heat cooking methods, such as baking, instead of high-heat cooking methods like deep frying.  Cook using healthy oils, such as olive, canola, or sunflower oil.  Avoid cooking with butter, cream, or high-fat meats. Meal planning  Eat meals and snacks regularly, preferably at the same times every day. Avoid going long periods of time without eating.  Eat foods high in fiber, such as fresh fruits, vegetables, beans, and whole grains. Talk to your dietitian about how many servings of carbs you can eat at each meal.  Eat 4-6 ounces (oz) of lean protein each day, such as lean meat, chicken, fish, eggs, or tofu. One oz of lean protein is equal to: ? 1 oz of meat, chicken, or fish. ? 1 egg. ?  cup of tofu.  Eat some foods each day that contain  healthy fats, such as avocado, nuts, seeds, and fish. Lifestyle  Check your blood glucose regularly.  Exercise regularly as told by your health care provider. This may include: ? 150 minutes of moderate-intensity or vigorous-intensity exercise each week. This could be brisk walking, biking, or water aerobics. ? Stretching and doing strength exercises, such as yoga or weightlifting, at least 2 times a week.  Take medicines as told by your health care provider.  Do not use any products that contain nicotine or tobacco, such as cigarettes and e-cigarettes. If you need help quitting, ask your health care provider.  Work with a counselor or diabetes educator to identify strategies to manage stress and any emotional and social challenges. Questions to ask a health care provider  Do I need to meet with a diabetes educator?  Do I need to meet with a dietitian?  What number can I call if I have questions?  When are the best times to   times to check my blood glucose? Where to find more information:  American Diabetes Association: diabetes.org  Academy of Nutrition and Dietetics: www.eatright.org  National Institute of Diabetes and Digestive and Kidney Diseases (NIH): www.niddk.nih.gov Summary  A healthy meal plan will help you control your blood glucose and maintain a healthy lifestyle.  Working with a diet and nutrition specialist (dietitian) can help you make a meal plan that is best for you.  Keep in mind that carbohydrates (carbs) and alcohol have immediate effects on your blood glucose levels. It is important to count carbs and to use alcohol carefully. This information is not intended to replace advice given to you by your health care provider. Make sure you discuss any questions you have with your health care provider. Document Revised: 10/01/2017 Document Reviewed: 11/23/2016 [employer]vier Patient Education  2020 [employer]  

## 2020-03-05 NOTE — Assessment & Plan Note (Signed)
Well-controlled hypertension.  Continue present medication.  No changes. Uncontrolled diabetes with hemoglobin A1c higher than before at 8.2.  Continue Metformin and glimepiride.  Add Januvia 50 mg daily.  Diet and nutrition discussed. Follow-up in 3 months.

## 2020-03-05 NOTE — Progress Notes (Signed)
Cheryl Hopkins 69 y.o.   Chief Complaint  Patient presents with  . Diabetes    follow up 6 months   . Hypertension    HISTORY OF PRESENT ILLNESS: This is a 69 y.o. female with history of hypertension, diabetes and dyslipidemia here for follow-up. 1.  Diabetes: On Metformin 500 mg twice a day and glimepiride 2 mg daily. Lab Results  Component Value Date   HGBA1C 6.9 (H) 08/14/2019   2.  Hypertension: On amlodipine 10 mg daily BP Readings from Last 3 Encounters:  03/05/20 128/73  08/14/19 115/68  07/30/19 119/65   3.  Dyslipidemia: On Crestor 20 mg daily Lab Results  Component Value Date   CHOL 317 (H) 08/14/2019   HDL 61 08/14/2019   LDLCALC 238 (H) 08/14/2019   TRIG 107 08/14/2019   CHOLHDL 5.2 (H) 08/14/2019   The 10-year ASCVD risk score Mikey Bussing DC Jr., et al., 2013) is: 15.1%   Values used to calculate the score:     Age: 48 years     Sex: Female     Is Non-Hispanic African American: No     Diabetic: Yes     Tobacco smoker: No     Systolic Blood Pressure: 989 mmHg     Is BP treated: No     HDL Cholesterol: 61 mg/dL     Total Cholesterol: 317 mg/dL Had pneumonia last year.  Still gets some occasional dyspnea on exertion. HPI   Prior to Admission medications   Medication Sig Start Date End Date Taking? Authorizing Provider  acetic acid-hydrocortisone (VOSOL-HC) OTIC solution Place 3 drops into the left ear 3 (three) times daily. 05/08/19   Horald Pollen, MD  albuterol (VENTOLIN HFA) 108 (90 Base) MCG/ACT inhaler Inhale 2 puffs into the lungs every 4 (four) hours as needed for wheezing or shortness of breath. Dispense with aerochamber 08/14/19   Horald Pollen, MD  amLODipine (NORVASC) 10 MG tablet Take 1 tablet (10 mg total) by mouth daily. 08/14/19 02/10/20  Horald Pollen, MD  Blood Glucose Monitoring Suppl w/Device KIT 1 Device by Does not apply route daily. 08/14/19   Horald Pollen, MD  glimepiride (AMARYL) 2 MG tablet Take 1 tablet (2  mg total) by mouth daily with breakfast. TAKE ONE TABLET BY MOUTH WITH BREAKFAST 08/14/19 02/10/20  Horald Pollen, MD  levofloxacin (LEVAQUIN) 750 MG tablet Take 1 tablet (750 mg total) by mouth daily. X 5 days Patient not taking: Reported on 08/14/2019 07/30/19   Melynda Ripple, MD  metFORMIN (GLUCOPHAGE-XR) 500 MG 24 hr tablet Take 1 tablet (500 mg total) by mouth 2 (two) times daily with a meal. 08/14/19 02/10/20  Uchenna Rappaport, Ines Bloomer, MD  rosuvastatin (CRESTOR) 20 MG tablet Take 1 tablet (20 mg total) by mouth daily. 08/14/19 02/10/20  Horald Pollen, MD  Spacer/Aero-Holding Chambers (AEROCHAMBER PLUS) inhaler Use as instructed 07/30/19   Melynda Ripple, MD    No Known Allergies  Patient Active Problem List   Diagnosis Date Noted  . Chronic pain of both knees 03/29/2019  . Chronic lung disease 10/31/2015  . Hypertension 10/31/2015  . Diabetes mellitus type II, controlled ([place]) 09/30/2015  . Hyperlipidemia 09/30/2015    Past Medical History:  Diagnosis Date  . Arthritis    knee  . Bronchitis   . Diabetes mellitus without complication ([place])    Several years.  . Hyperlipidemia 09/30/2015  . Hypertension   . Stomach pain    with diarrhea    Past  Surgical History:  Procedure Laterality Date  . COLONOSCOPY      Social History   Socioeconomic History  . Marital status: Widowed    Spouse name: Widowed since 2010  . Number of children: 9  . Years of education: <8  . Highest education level: Not on file  Occupational History  . Occupation: Housewife  Tobacco Use  . Smoking status: Never Smoker  . Smokeless tobacco: Never Used  . Tobacco comment: Chews cola nut  Substance and Sexual Activity  . Alcohol use: No    Alcohol/week: 0.0 standard drinks  . Drug use: No  . Sexual activity: Not Currently  Other Topics Concern  . Not on file  Social History Narrative      Widow   Originally from [place] - Came to [place]. In 2016   Lives with son and his wife  and their 3 children.        Exercise - no exercise      Seatbelt - 100%   Gun in home - no   Social Determinants of Health   Financial Resource Strain:   . Difficulty of Paying Living Expenses:   Food Insecurity:   . Worried About Charity fundraiser in the Last Year:   . Arboriculturist in the Last Year:   Transportation Needs:   . Film/video editor (Medical):   Marland Kitchen Lack of Transportation (Non-Medical):   Physical Activity:   . Days of Exercise per Week:   . Minutes of Exercise per Session:   Stress:   . Feeling of Stress :   Social Connections:   . Frequency of Communication with Friends and Family:   . Frequency of Social Gatherings with Friends and Family:   . Attends Religious Services:   . Active Member of Clubs or Organizations:   . Attends Archivist Meetings:   Marland Kitchen Marital Status:   Intimate Partner Violence:   . Fear of Current or Ex-Partner:   . Emotionally Abused:   Marland Kitchen Physically Abused:   . Sexually Abused:     Family History  Problem Relation Age of Onset  . Bronchitis Sister   . Hepatitis Daughter   . Hypertension Sister   . Stroke Sister   . Colon cancer Neg Hx      Review of Systems  Constitutional: Negative.  Negative for chills and fever.  HENT: Negative.  Negative for congestion and sore throat.   Respiratory: Positive for shortness of breath (Dyspnea on exertion). Negative for cough.   Cardiovascular: Negative.  Negative for chest pain and palpitations.  Gastrointestinal: Negative.  Negative for abdominal pain, blood in stool, diarrhea, melena, nausea and vomiting.  Genitourinary: Negative.  Negative for hematuria.  Musculoskeletal: Negative.   Skin: Negative.  Negative for rash.  Neurological: Negative.  Negative for dizziness and headaches.  All other systems reviewed and are negative.  Vitals:   03/05/20 0804  BP: 128/73  Pulse: 71  Resp: 16  Temp: (!) 97.3 F (36.3 C)  SpO2: 92%     Physical Exam Vitals  reviewed.  Constitutional:      Appearance: Normal appearance.  HENT:     Head: Normocephalic.  Eyes:     Extraocular Movements: Extraocular movements intact.     Conjunctiva/sclera: Conjunctivae normal.     Pupils: Pupils are equal, round, and reactive to light.  Cardiovascular:     Rate and Rhythm: Normal rate and regular rhythm.  Pulmonary:     Effort:  Pulmonary effort is normal.     Breath sounds: Rales (Both bases) present.  Musculoskeletal:        General: Normal range of motion.     Cervical back: Normal range of motion and neck supple.  Skin:    General: Skin is warm and dry.     Capillary Refill: Capillary refill takes less than 2 seconds.  Neurological:     General: No focal deficit present.     Mental Status: She is alert and oriented to person, place, and time.  Psychiatric:        Mood and Affect: Mood normal.        Behavior: Behavior normal.    Results for orders placed or performed in visit on 03/05/20 (from the past 24 hour(s))  POCT glucose (manual entry)     Status: Abnormal   Collection Time: 03/05/20  8:53 AM  Result Value Ref Range   POC Glucose 144 (A) 70 - 99 mg/dl  POCT glycosylated hemoglobin (Hb A1C)     Status: Abnormal   Collection Time: 03/05/20  8:54 AM  Result Value Ref Range   Hemoglobin A1C 8.2 (A) 4.0 - 5.6 %   HbA1c POC (<> result, manual entry)     HbA1c, POC (prediabetic range)     HbA1c, POC (controlled diabetic range)     DG Chest 2 View  Result Date: 03/05/2020 CLINICAL DATA:  Dyspnea on exertion. EXAM: CHEST - 2 VIEW COMPARISON:  07/30/2019 FINDINGS: Lungs are adequately inflated without focal airspace consolidation or effusion. Focal 2.2 cm density over the left base slightly more dense than or benign as compared to the prior exam. Cardiomediastinal silhouette and remainder of the exam is unchanged. Artifact over the soft tissues of the left neck/supraclavicular region. IMPRESSION: 1.  No acute cardiopulmonary disease. 2. 2.2 cm  nodular opacity over the left base slightly worse. Recommend noncontrast chest CT for further evaluation. Electronically Signed   By: Marin Olp M.D.   On: 03/05/2020 08:57     ASSESSMENT & PLAN: Hypertension associated with diabetes ([place]) Well-controlled hypertension.  Continue present medication.  No changes. Uncontrolled diabetes with hemoglobin A1c higher than before at 8.2.  Continue Metformin and glimepiride.  Add Januvia 50 mg daily.  Diet and nutrition discussed. Follow-up in 3 months.  Solitary pulmonary nodule Dyspnea on exertion could be related to this.  Radiologist recommends CT chest without contrast.  Will schedule it.  Deepa was seen today for diabetes and hypertension.  Diagnoses and all orders for this visit:  Hypertension associated with diabetes ([place]) -     POCT glucose (manual entry) -     POCT glycosylated hemoglobin (Hb A1C) -     CBC with Differential/Platelet -     Comprehensive metabolic panel -     amLODipine (NORVASC) 10 MG tablet; Take 1 tablet (10 mg total) by mouth daily. -     glimepiride (AMARYL) 2 MG tablet; Take 1 tablet (2 mg total) by mouth daily with breakfast. TAKE ONE TABLET BY MOUTH WITH BREAKFAST -     metFORMIN (GLUCOPHAGE-XR) 500 MG 24 hr tablet; Take 1 tablet (500 mg total) by mouth 2 (two) times daily with a meal. -     sitaGLIPtin (JANUVIA) 50 MG tablet; Take 1 tablet (50 mg total) by mouth daily.  Dyslipidemia associated with type 2 diabetes mellitus ([place]) -     CBC with Differential/Platelet -     Lipid panel -     rosuvastatin (CRESTOR)  20 MG tablet; Take 1 tablet (20 mg total) by mouth daily.  Dyspnea on exertion -     DG Chest 2 View  Encounter for screening mammogram for malignant neoplasm of breast -     MM Digital Screening; Future  Solitary pulmonary nodule -     CT Chest Wo Contrast; Future    Patient Instructions       If you have lab work done today you will be contacted with your lab results within the  next 2 weeks.  If you have not heard from [place] then please contact us. The fastest way to get your results is to register for My Chart.   IF you received an x-ray today, you will receive an invoice from [facility]. Please contact [facility] at [phone] with questions or concerns regarding your invoice.   IF you received labwork today, you will receive an invoice from [facility]. Please contact [facility] at [phone] with questions or concerns regarding your invoice.   Our billing staff will not be able to assist you with questions regarding bills from these companies.  You will be contacted with the lab results as soon as they are available. The fastest way to get your results is to activate your My Chart account. Instructions are located on the last page of this paperwork. If you have not heard from [place] regarding the results in 2 weeks, please contact this office.     Diabetes Mellitus and Nutrition, Adult When you have diabetes (diabetes mellitus), it is very important to have healthy eating habits because your blood sugar (glucose) levels are greatly affected by what you eat and drink. Eating healthy foods in the appropriate amounts, at about the same times every day, can help you:  Control your blood glucose.  Lower your risk of heart disease.  Improve your blood pressure.  Reach or maintain a healthy weight. Every person with diabetes is different, and each person has different needs for a meal plan. Your health care provider may recommend that you work with a diet and nutrition specialist (dietitian) to make a meal plan that is best for you. Your meal plan may vary depending on factors such as:  The calories you need.  The medicines you take.  Your weight.  Your blood glucose, blood pressure, and cholesterol levels.  Your activity level.  Other health conditions you have, such as heart or kidney disease. How do carbohydrates affect me? Carbohydrates,  also called carbs, affect your blood glucose level more than any other type of food. Eating carbs naturally raises the amount of glucose in your blood. Carb counting is a method for keeping track of how many carbs you eat. Counting carbs is important to keep your blood glucose at a healthy level, especially if you use insulin or take certain oral diabetes medicines. It is important to know how many carbs you can safely have in each meal. This is different for every person. Your dietitian can help you calculate how many carbs you should have at each meal and for each snack. Foods that contain carbs include:  Bread, cereal, rice, pasta, and crackers.  Potatoes and corn.  Peas, beans, and lentils.  Milk and yogurt.  Fruit and juice.  Desserts, such as cakes, cookies, ice cream, and candy. How does alcohol affect me? Alcohol can cause a sudden decrease in blood glucose (hypoglycemia), especially if you use insulin or take certain oral diabetes medicines. Hypoglycemia can be a life-threatening condition. Symptoms of hypoglycemia (  sleepiness, dizziness, and confusion) are similar to symptoms of having too much alcohol. If your health care provider says that alcohol is safe for you, follow these guidelines:  Limit alcohol intake to no more than 1 drink per day for nonpregnant women and 2 drinks per day for men. One drink equals 12 oz of beer, 5 oz of wine, or 1 oz of hard liquor.  Do not drink on an empty stomach.  Keep yourself hydrated with water, diet soda, or unsweetened iced tea.  Keep in mind that regular soda, juice, and other mixers may contain a lot of sugar and must be counted as carbs. What are tips for following this plan?  Reading food labels  Start by checking the serving size on the "Nutrition Facts" label of packaged foods and drinks. The amount of calories, carbs, fats, and other nutrients listed on the label is based on one serving of the item. Many items contain more than  one serving per package.  Check the total grams (g) of carbs in one serving. You can calculate the number of servings of carbs in one serving by dividing the total carbs by 15. For example, if a food has 30 g of total carbs, it would be equal to 2 servings of carbs.  Check the number of grams (g) of saturated and trans fats in one serving. Choose foods that have low or no amount of these fats.  Check the number of milligrams (mg) of salt (sodium) in one serving. Most people should limit total sodium intake to less than 2,300 mg per day.  Always check the nutrition information of foods labeled as "low-fat" or "nonfat". These foods may be higher in added sugar or refined carbs and should be avoided.  Talk to your dietitian to identify your daily goals for nutrients listed on the label. Shopping  Avoid buying canned, premade, or processed foods. These foods tend to be high in fat, sodium, and added sugar.  Shop around the outside edge of the grocery store. This includes fresh fruits and vegetables, bulk grains, fresh meats, and fresh dairy. Cooking  Use low-heat cooking methods, such as baking, instead of high-heat cooking methods like deep frying.  Cook using healthy oils, such as olive, canola, or sunflower oil.  Avoid cooking with butter, cream, or high-fat meats. Meal planning  Eat meals and snacks regularly, preferably at the same times every day. Avoid going long periods of time without eating.  Eat foods high in fiber, such as fresh fruits, vegetables, beans, and whole grains. Talk to your dietitian about how many servings of carbs you can eat at each meal.  Eat 4-6 ounces (oz) of lean protein each day, such as lean meat, chicken, fish, eggs, or tofu. One oz of lean protein is equal to: ? 1 oz of meat, chicken, or fish. ? 1 egg. ?  cup of tofu.  Eat some foods each day that contain healthy fats, such as avocado, nuts, seeds, and fish. Lifestyle  Check your blood glucose  regularly.  Exercise regularly as told by your health care provider. This may include: ? 150 minutes of moderate-intensity or vigorous-intensity exercise each week. This could be brisk walking, biking, or water aerobics. ? Stretching and doing strength exercises, such as yoga or weightlifting, at least 2 times a week.  Take medicines as told by your health care provider.  Do not use any products that contain nicotine or tobacco, such as cigarettes and e-cigarettes. If you need help quitting,  ask your health care provider.  Work with a Social worker or diabetes educator to identify strategies to manage stress and any emotional and social challenges. Questions to ask a health care provider  Do I need to meet with a diabetes educator?  Do I need to meet with a dietitian?  What number can I call if I have questions?  When are the best times to check my blood glucose? Where to find more information:  American Diabetes Association: diabetes.org  Academy of Nutrition and Dietetics: www.eatright.[employer] of Diabetes and Digestive and Kidney Diseases (NIH): [employer] Summary  A healthy meal plan will help you control your blood glucose and maintain a healthy lifestyle.  Working with a diet and nutrition specialist (dietitian) can help you make a meal plan that is best for you.  Keep in mind that carbohydrates (carbs) and alcohol have immediate effects on your blood glucose levels. It is important to count carbs and to use alcohol carefully. This information is not intended to replace advice given to you by your health care provider. Make sure you discuss any questions you have with your health care provider. Document Revised: 10/01/2017 Document Reviewed: 11/23/2016 Elsevier Patient Education  2020 Elsevier Inc.      Agustina Caroli, MD [facility]

## 2020-03-05 NOTE — Assessment & Plan Note (Signed)
Dyspnea on exertion could be related to this.  Radiologist recommends CT chest without contrast.  Will schedule it.

## 2020-03-06 LAB — COMPREHENSIVE METABOLIC PANEL
ALT: 15 IU/L (ref 0–32)
AST: 15 IU/L (ref 0–40)
Albumin/Globulin Ratio: 1.5 (ref 1.2–2.2)
Albumin: 4.8 g/dL (ref 3.8–4.8)
Alkaline Phosphatase: 92 IU/L (ref 39–117)
BUN/Creatinine Ratio: 11 — ABNORMAL LOW (ref 12–28)
BUN: 8 mg/dL (ref 8–27)
Bilirubin Total: 0.2 mg/dL (ref 0.0–1.2)
CO2: 27 mmol/L (ref 20–29)
Calcium: 9.7 mg/dL (ref 8.7–10.3)
Chloride: 100 mmol/L (ref 96–106)
Creatinine, Ser: 0.71 mg/dL (ref 0.57–1.00)
GFR calc Af Amer: 102 mL/min/{1.73_m2} (ref >59)
GFR calc non Af Amer: 88 mL/min/{1.73_m2} (ref >59)
Globulin, Total: 3.3 g/dL (ref 1.5–4.5)
Glucose: 145 mg/dL — ABNORMAL HIGH (ref 65–99)
Potassium: 4 mmol/L (ref 3.5–5.2)
Sodium: 143 mmol/L (ref 134–144)
Total Protein: 8.1 g/dL (ref 6.0–8.5)

## 2020-03-06 LAB — CBC WITH DIFFERENTIAL/PLATELET
Basophils Absolute: 0.1 10*3/uL (ref 0.0–0.2)
Basos: 1 %
EOS (ABSOLUTE): 0.3 10*3/uL (ref 0.0–0.4)
Eos: 4 %
Hematocrit: 46.8 % — ABNORMAL HIGH (ref 34.0–46.6)
Hemoglobin: 15 g/dL (ref 11.1–15.9)
Immature Grans (Abs): 0 10*3/uL (ref 0.0–0.1)
Immature Granulocytes: 0 %
Lymphocytes Absolute: 4.1 10*3/uL — ABNORMAL HIGH (ref 0.7–3.1)
Lymphs: 53 %
MCH: 27.1 pg (ref 26.6–33.0)
MCHC: 32.1 g/dL (ref 31.5–35.7)
MCV: 85 fL (ref 79–97)
Monocytes Absolute: 0.5 10*3/uL (ref 0.1–0.9)
Monocytes: 6 %
Neutrophils Absolute: 2.8 10*3/uL (ref 1.4–7.0)
Neutrophils: 36 %
Platelets: 359 10*3/uL (ref 150–450)
RBC: 5.53 x10E6/uL — ABNORMAL HIGH (ref 3.77–5.28)
RDW: 13.3 % (ref 11.7–15.4)
WBC: 7.8 10*3/uL (ref 3.4–10.8)

## 2020-03-06 LAB — LIPID PANEL
Chol/HDL Ratio: 3.6 ratio (ref 0.0–4.4)
Cholesterol, Total: 194 mg/dL (ref 100–199)
HDL: 54 mg/dL (ref >39)
LDL Chol Calc (NIH): 118 mg/dL — ABNORMAL HIGH (ref 0–99)
Triglycerides: 123 mg/dL (ref 0–149)
VLDL Cholesterol Cal: 22 mg/dL (ref 5–40)

## 2020-03-12 ENCOUNTER — Ambulatory Visit (HOSPITAL_COMMUNITY): Payer: Self-pay

## 2020-04-04 ENCOUNTER — Ambulatory Visit (HOSPITAL_COMMUNITY): Payer: Self-pay

## 2020-04-24 ENCOUNTER — Other Ambulatory Visit: Payer: Self-pay

## 2020-04-24 ENCOUNTER — Ambulatory Visit (HOSPITAL_COMMUNITY)
Admission: RE | Admit: 2020-04-24 | Discharge: 2020-04-24 | Disposition: A | Discharge status: Home / Self Care | Payer: Medicaid Other | Source: Ambulatory Visit | Attending: Emergency Medicine | Admitting: Emergency Medicine

## 2020-04-24 DIAGNOSIS — R911 Solitary pulmonary nodule: Secondary | ICD-10-CM | POA: Diagnosis not present

## 2020-04-27 ENCOUNTER — Other Ambulatory Visit: Payer: Self-pay | Admitting: Emergency Medicine

## 2020-04-27 DIAGNOSIS — J984 Other disorders of lung: Secondary | ICD-10-CM

## 2020-04-27 DIAGNOSIS — R0609 Other forms of dyspnea: Secondary | ICD-10-CM

## 2020-04-27 DIAGNOSIS — R9389 Abnormal findings on diagnostic imaging of other specified body structures: Secondary | ICD-10-CM

## 2020-04-29 ENCOUNTER — Telehealth: Payer: Self-pay | Admitting: Emergency Medicine

## 2020-04-29 NOTE — Telephone Encounter (Signed)
Pt son called for test result please Advice

## 2020-04-29 NOTE — Telephone Encounter (Signed)
LVM for patient to inform referral to pulmonology placement.

## 2020-04-29 NOTE — Telephone Encounter (Signed)
Pt son called regarding pts chest CT scan and would like a call regarding that. Please advise.

## 2020-05-04 ENCOUNTER — Other Ambulatory Visit: Payer: Self-pay | Admitting: Emergency Medicine

## 2020-05-04 DIAGNOSIS — I152 Hypertension secondary to endocrine disorders: Secondary | ICD-10-CM

## 2020-05-07 ENCOUNTER — Encounter: Payer: Self-pay | Admitting: Radiology

## 2020-05-08 ENCOUNTER — Other Ambulatory Visit: Payer: Self-pay | Admitting: Emergency Medicine

## 2020-05-08 MED ORDER — BLOOD GLUCOSE MONITORING SUPPL W/DEVICE KIT: 1.0000 | PACK | Freq: Every day | 0 refills | Status: DC

## 2020-05-08 NOTE — Telephone Encounter (Signed)
Pts son called again and is requesting to have a small digital meter. Please advise.

## 2020-05-08 NOTE — Telephone Encounter (Signed)
Medication Refill - Medication: Blood Glucose Monitoring Suppl w/Device KIT  And test strips  Has the patient contacted their pharmacy? Yes.   (Agent: If no, request that the patient contact the pharmacy for the refill.) (Agent: If yes, when and what did the pharmacy advise?) told to call office   Preferred Pharmacy (with phone number or street name):  [facility]),  - [place] Phone:  [phone]  Fax:  [phone]       Agent: Please be advised that RX refills may take up to 3 business days. We ask that you follow-up with your pharmacy.

## 2020-05-09 ENCOUNTER — Telehealth: Payer: Self-pay | Admitting: Internal Medicine

## 2020-05-09 ENCOUNTER — Telehealth: Payer: Self-pay | Admitting: Emergency Medicine

## 2020-05-09 NOTE — Telephone Encounter (Signed)
Also glimepiride. So per last office visit patient is to continue on metformin, and glimepiride and Jamuvia was added. So she is to continue on all 3 meds at this time.

## 2020-05-09 NOTE — Telephone Encounter (Signed)
pt speaks [place] and requires an interpreter - pt's son (on [place]) is wanting to know if pt HAS to have interpreter - he states that [place] and everyone in [place] knows each other and doesn't want everyone to know her medical information - son wants to be interpreter and per Sharl Ma, it is up to Dr. discretion - please advise son at [phone]. Pt is scheduled for abnormal CT with Dr. Chase Caller on 06/17/20  At 9:00 AM

## 2020-05-09 NOTE — Telephone Encounter (Signed)
Patient's son was informed that patient is suppose to continue on the metformin and Januvia per last office visit.

## 2020-05-09 NOTE — Telephone Encounter (Signed)
Pts son called and Pt wanted to know if now that she is taking metFORMIN (GLUCOPHAGE-XR) 500 MG 24 hr tablet  Should she also be taking sitaGLIPtin (JANUVIA) 50 MG tablet  / please advise    Pt also inquired about a referral that was for her issue with her lungs/ she has not been called or heard anything/ please advise

## 2020-05-09 NOTE — Telephone Encounter (Signed)
MR please advise thanks  

## 2020-05-13 NOTE — Telephone Encounter (Signed)
Please ask son if  A) STRATUS - will be ok? IF so, then it wil be someone outside [place] but if the community is so small that even that person remotely will gossip we can avoid  B) if stratus wont work then I need the son to interpret

## 2020-05-13 NOTE — Telephone Encounter (Signed)
Called and spoke with Patient's son Ilda Mori.  Dr Golden Pop recommendations given.  Understanding stated.  Patient appointment clarified with Son. Ilda Mori will be with Patient at [place].  Nothing further at this time.

## 2020-06-04 ENCOUNTER — Ambulatory Visit: Payer: Medicaid Other | Admitting: Emergency Medicine

## 2020-06-04 ENCOUNTER — Other Ambulatory Visit: Payer: Self-pay

## 2020-06-05 ENCOUNTER — Encounter: Payer: Self-pay | Admitting: Emergency Medicine

## 2020-06-05 ENCOUNTER — Ambulatory Visit (INDEPENDENT_AMBULATORY_CARE_PROVIDER_SITE_OTHER): Payer: Medicaid Other | Admitting: Emergency Medicine

## 2020-06-05 VITALS — BP 128/70 | HR 75 | Temp 98.0°F | Ht 68.0 in | Wt 153.6 lb

## 2020-06-05 DIAGNOSIS — E785 Hyperlipidemia, unspecified: Secondary | ICD-10-CM

## 2020-06-05 DIAGNOSIS — H9192 Unspecified hearing loss, left ear: Secondary | ICD-10-CM | POA: Diagnosis not present

## 2020-06-05 DIAGNOSIS — I10 Essential (primary) hypertension: Secondary | ICD-10-CM

## 2020-06-05 DIAGNOSIS — J984 Other disorders of lung: Secondary | ICD-10-CM

## 2020-06-05 DIAGNOSIS — E1169 Type 2 diabetes mellitus with other specified complication: Secondary | ICD-10-CM | POA: Diagnosis not present

## 2020-06-05 DIAGNOSIS — E1159 Type 2 diabetes mellitus with other circulatory complications: Secondary | ICD-10-CM | POA: Diagnosis not present

## 2020-06-05 LAB — POCT GLYCOSYLATED HEMOGLOBIN (HGB A1C): Hemoglobin A1C: 7.2 % — AB (ref 4.0–5.6)

## 2020-06-05 LAB — GLUCOSE, POCT (MANUAL RESULT ENTRY): POC Glucose: 108 mg/dl — AB (ref 70–99)

## 2020-06-05 MED ORDER — ACCU-CHEK GUIDE VI STRP: ORAL_STRIP | 7 refills | Status: DC

## 2020-06-05 MED ORDER — AMLODIPINE BESYLATE 10 MG PO TABS: 10.0000 mg | ORAL_TABLET | Freq: Every day | ORAL | 3 refills | Status: DC

## 2020-06-05 MED ORDER — ROSUVASTATIN CALCIUM 20 MG PO TABS: 20.0000 mg | ORAL_TABLET | Freq: Every day | ORAL | 3 refills | Status: DC

## 2020-06-05 MED ORDER — ACCU-CHEK SOFTCLIX LANCETS MISC: 7 refills | Status: DC

## 2020-06-05 MED ORDER — METFORMIN HCL ER 500 MG PO TB24: 500.0000 mg | ORAL_TABLET | Freq: Two times a day (BID) | ORAL | 3 refills | Status: DC

## 2020-06-05 MED ORDER — SITAGLIPTIN PHOSPHATE 50 MG PO TABS: 50.0000 mg | ORAL_TABLET | Freq: Every day | ORAL | 3 refills | Status: DC

## 2020-06-05 MED ORDER — GLIMEPIRIDE 2 MG PO TABS: 2.0000 mg | ORAL_TABLET | Freq: Every day | ORAL | 3 refills | Status: DC

## 2020-06-05 NOTE — Patient Instructions (Addendum)
If you have lab work done today you will be contacted with your lab results within the next 2 weeks.  If you have not heard from [place] then please contact us. The fastest way to get your results is to register for My Chart.   IF you received an x-ray today, you will receive an invoice from [facility]. Please contact [facility] at [phone] with questions or concerns regarding your invoice.   IF you received labwork today, you will receive an invoice from [facility]. Please contact [facility] at [phone] with questions or concerns regarding your invoice.   Our billing staff will not be able to assist you with questions regarding bills from these companies.  You will be contacted with the lab results as soon as they are available. The fastest way to get your results is to activate your My Chart account. Instructions are located on the last page of this paperwork. If you have not heard from [place] regarding the results in 2 weeks, please contact this office.      Diabetes Mellitus and Nutrition, Adult When you have diabetes (diabetes mellitus), it is very important to have healthy eating habits because your blood sugar (glucose) levels are greatly affected by what you eat and drink. Eating healthy foods in the appropriate amounts, at about the same times every day, can help you:  Control your blood glucose.  Lower your risk of heart disease.  Improve your blood pressure.  Reach or maintain a healthy weight. Every person with diabetes is different, and each person has different needs for a meal plan. Your health care provider may recommend that you work with a diet and nutrition specialist (dietitian) to make a meal plan that is best for you. Your meal plan may vary depending on factors such as:  The calories you need.  The medicines you take.  Your weight.  Your blood glucose, blood pressure, and cholesterol levels.  Your activity level.  Other health  conditions you have, such as heart or kidney disease. How do carbohydrates affect me? Carbohydrates, also called carbs, affect your blood glucose level more than any other type of food. Eating carbs naturally raises the amount of glucose in your blood. Carb counting is a method for keeping track of how many carbs you eat. Counting carbs is important to keep your blood glucose at a healthy level, especially if you use insulin or take certain oral diabetes medicines. It is important to know how many carbs you can safely have in each meal. This is different for every person. Your dietitian can help you calculate how many carbs you should have at each meal and for each snack. Foods that contain carbs include:  Bread, cereal, rice, pasta, and crackers.  Potatoes and corn.  Peas, beans, and lentils.  Milk and yogurt.  Fruit and juice.  Desserts, such as cakes, cookies, ice cream, and candy. How does alcohol affect me? Alcohol can cause a sudden decrease in blood glucose (hypoglycemia), especially if you use insulin or take certain oral diabetes medicines. Hypoglycemia can be a life-threatening condition. Symptoms of hypoglycemia (sleepiness, dizziness, and confusion) are similar to symptoms of having too much alcohol. If your health care provider says that alcohol is safe for you, follow these guidelines:  Limit alcohol intake to no more than 1 drink per day for nonpregnant women and 2 drinks per day for men. One drink equals 12 oz of beer, 5 oz of wine, or 1 oz of hard  liquor.  Do not drink on an empty stomach.  Keep yourself hydrated with water, diet soda, or unsweetened iced tea.  Keep in mind that regular soda, juice, and other mixers may contain a lot of sugar and must be counted as carbs. What are tips for following this plan?  Reading food labels  Start by checking the serving size on the "Nutrition Facts" label of packaged foods and drinks. The amount of calories, carbs, fats, and  other nutrients listed on the label is based on one serving of the item. Many items contain more than one serving per package.  Check the total grams (g) of carbs in one serving. You can calculate the number of servings of carbs in one serving by dividing the total carbs by 15. For example, if a food has 30 g of total carbs, it would be equal to 2 servings of carbs.  Check the number of grams (g) of saturated and trans fats in one serving. Choose foods that have low or no amount of these fats.  Check the number of milligrams (mg) of salt (sodium) in one serving. Most people should limit total sodium intake to less than 2,300 mg per day.  Always check the nutrition information of foods labeled as "low-fat" or "nonfat". These foods may be higher in added sugar or refined carbs and should be avoided.  Talk to your dietitian to identify your daily goals for nutrients listed on the label. Shopping  Avoid buying canned, premade, or processed foods. These foods tend to be high in fat, sodium, and added sugar.  Shop around the outside edge of the grocery store. This includes fresh fruits and vegetables, bulk grains, fresh meats, and fresh dairy. Cooking  Use low-heat cooking methods, such as baking, instead of high-heat cooking methods like deep frying.  Cook using healthy oils, such as olive, canola, or sunflower oil.  Avoid cooking with butter, cream, or high-fat meats. Meal planning  Eat meals and snacks regularly, preferably at the same times every day. Avoid going long periods of time without eating.  Eat foods high in fiber, such as fresh fruits, vegetables, beans, and whole grains. Talk to your dietitian about how many servings of carbs you can eat at each meal.  Eat 4-6 ounces (oz) of lean protein each day, such as lean meat, chicken, fish, eggs, or tofu. One oz of lean protein is equal to: ? 1 oz of meat, chicken, or fish. ? 1 egg. ?  cup of tofu.  Eat some foods each day that  contain healthy fats, such as avocado, nuts, seeds, and fish. Lifestyle  Check your blood glucose regularly.  Exercise regularly as told by your health care provider. This may include: ? 150 minutes of moderate-intensity or vigorous-intensity exercise each week. This could be brisk walking, biking, or water aerobics. ? Stretching and doing strength exercises, such as yoga or weightlifting, at least 2 times a week.  Take medicines as told by your health care provider.  Do not use any products that contain nicotine or tobacco, such as cigarettes and e-cigarettes. If you need help quitting, ask your health care provider.  Work with a Social worker or diabetes educator to identify strategies to manage stress and any emotional and social challenges. Questions to ask a health care provider  Do I need to meet with a diabetes educator?  Do I need to meet with a dietitian?  What number can I call if I have questions?  When are the best  times to check my blood glucose? Where to find more information:  American Diabetes Association: diabetes.org  Academy of Nutrition and Dietetics: www.eatright.[employer] of Diabetes and Digestive and Kidney Diseases (NIH): [employer] Summary  A healthy meal plan will help you control your blood glucose and maintain a healthy lifestyle.  Working with a diet and nutrition specialist (dietitian) can help you make a meal plan that is best for you.  Keep in mind that carbohydrates (carbs) and alcohol have immediate effects on your blood glucose levels. It is important to count carbs and to use alcohol carefully. This information is not intended to replace advice given to you by your health care provider. Make sure you discuss any questions you have with your health care provider. Document Revised: 10/01/2017 Document Reviewed: 11/23/2016 Elsevier Patient Education  2020 [employer].

## 2020-06-05 NOTE — Progress Notes (Signed)
Cheryl Hopkins 69 y.o.   Chief Complaint  Patient presents with  . Hypertension    f/u   . Diabetes    med refill  . referral for hearing aid    HISTORY OF PRESENT ILLNESS: This is a 69 y.o. female with history of hypertension, diabetes, and chronic lung disease here for follow-up and medication refill. 1.  Diabetes: On Metformin 500 mg twice a day, glimepiride 2 mg once a day, and Januvia 50 mg daily.  Does not check blood sugars at home. 2.  Hypertension: On amlodipine 10 mg daily 3.  Dyslipidemia: On Crestor 20 mg daily. 4.  Chronic lung disease.  Recent CT of chest with abnormal findings.  Scheduled to see pulmonologist on August 18th.. Complaining of decreased hearing from left ear. Fully vaccinated against Covid. No other complaints or medical concerns today.  HPI   Prior to Admission medications   Medication Sig Start Date End Date Taking? Authorizing Provider  ACCU-CHEK GUIDE test strip USE 1 STRIP ONCE DAILY 05/14/20  Yes [provider]  Accu-Chek Softclix Lancets lancets SMARTSIG:1 Topical Daily 05/14/20  Yes [provider]  amLODipine (NORVASC) 10 MG tablet Take 1 tablet (10 mg total) by mouth daily. 03/05/20 06/05/20 Yes Conrado Nance, Ines Bloomer, MD  Blood Glucose Monitoring Suppl w/Device KIT 1 Device by Does not apply route daily. 05/08/20  Yes Emori Kamau, Ines Bloomer, MD  glimepiride (AMARYL) 2 MG tablet Take 1 tablet (2 mg total) by mouth daily with breakfast. TAKE ONE TABLET BY MOUTH WITH BREAKFAST 03/05/20 06/05/20 Yes Nansi [place], Ines Bloomer, MD  metFORMIN (GLUCOPHAGE-XR) 500 MG 24 hr tablet Take 1 tablet (500 mg total) by mouth 2 (two) times daily with a meal. 03/05/20 06/05/20 Yes Starling Christofferson, Ines Bloomer, MD  rosuvastatin (CRESTOR) 20 MG tablet Take 1 tablet (20 mg total) by mouth daily. 03/05/20 06/05/20 Yes Raquell Richer, Ines Bloomer, MD  sitaGLIPtin (JANUVIA) 50 MG tablet Take 1 tablet (50 mg total) by mouth daily. 03/05/20 06/05/20 Yes [place], Ines Bloomer, MD    Spacer/Aero-Holding Chambers (AEROCHAMBER PLUS) inhaler Use as instructed Patient not taking: Reported on 06/05/2020 07/30/19   Melynda Ripple, MD    No Known Allergies  Patient Active Problem List   Diagnosis Date Noted  . Solitary pulmonary nodule 03/05/2020  . Chronic pain of both knees 03/29/2019  . Chronic lung disease 10/31/2015  . Hypertension associated with diabetes ([place]) 10/31/2015  . Dyslipidemia associated with type 2 diabetes mellitus ([place]) 09/30/2015  . Hyperlipidemia 09/30/2015    Past Medical History:  Diagnosis Date  . Arthritis    knee  . Bronchitis   . Diabetes mellitus without complication ([place])    Several years.  . Hyperlipidemia 09/30/2015  . Hypertension   . Stomach pain    with diarrhea    Past Surgical History:  Procedure Laterality Date  . COLONOSCOPY      Social History   Socioeconomic History  . Marital status: Widowed    Spouse name: Widowed since 2010  . Number of children: 9  . Years of education: <8  . Highest education level: Not on file  Occupational History  . Occupation: Housewife  Tobacco Use  . Smoking status: Never Smoker  . Smokeless tobacco: Never Used  . Tobacco comment: Chews cola nut  Substance and Sexual Activity  . Alcohol use: No    Alcohol/week: 0.0 standard drinks  . Drug use: No  . Sexual activity: Not Currently  Other Topics Concern  . Not on file  Social  History Narrative      Widow   Originally from [place] - Came to [place]. In 2016   Lives with son and his wife and their 3 children.        Exercise - no exercise      Seatbelt - 100%   Gun in home - no   Social Determinants of Health   Financial Resource Strain:   . Difficulty of Paying Living Expenses:   Food Insecurity:   . Worried About Charity fundraiser in the Last Year:   . Arboriculturist in the Last Year:   Transportation Needs:   . Film/video editor (Medical):   Marland Kitchen Lack of Transportation (Non-Medical):   Physical Activity:   .  Days of Exercise per Week:   . Minutes of Exercise per Session:   Stress:   . Feeling of Stress :   Social Connections:   . Frequency of Communication with Friends and Family:   . Frequency of Social Gatherings with Friends and Family:   . Attends Religious Services:   . Active Member of Clubs or Organizations:   . Attends Archivist Meetings:   Marland Kitchen Marital Status:   Intimate Partner Violence:   . Fear of Current or Ex-Partner:   . Emotionally Abused:   Marland Kitchen Physically Abused:   . Sexually Abused:     Family History  Problem Relation Age of Onset  . Bronchitis Sister   . Hepatitis Daughter   . Hypertension Sister   . Stroke Sister   . Colon cancer Neg Hx      Review of Systems  Constitutional: Negative.  Negative for chills and fever.  HENT: Positive for hearing loss (Left ear). Negative for congestion and sore throat.   Respiratory: Negative.  Negative for cough and shortness of breath.   Cardiovascular: Negative.  Negative for chest pain and palpitations.  Gastrointestinal: Negative.  Negative for abdominal pain, diarrhea, nausea and vomiting.  Genitourinary: Negative.  Negative for dysuria and hematuria.  Musculoskeletal: Positive for joint pain (Chronic bilateral knee pain). Negative for back pain, myalgias and neck pain.  Skin: Negative.  Negative for rash.  Neurological: Negative.  Negative for dizziness and headaches.  All other systems reviewed and are negative.    Today's Vitals   06/05/20 0845  BP: 128/70  Pulse: 75  Temp: 98 F (36.7 C)  TempSrc: Temporal  SpO2: 94%  Weight: 153 lb 9.6 oz (69.7 kg)  Height: '5\' 8"'$  (1.727 m)   Body mass index is 23.35 kg/m.   Physical Exam Vitals reviewed.  Constitutional:      Appearance: Normal appearance.  HENT:     Head: Normocephalic.     Right Ear: Tympanic membrane, ear canal and external ear normal.     Left Ear: Tympanic membrane, ear canal and external ear normal.     Mouth/Throat:     Mouth:  Mucous membranes are moist.     Pharynx: Oropharynx is clear.  Eyes:     Extraocular Movements: Extraocular movements intact.     Conjunctiva/sclera: Conjunctivae normal.     Pupils: Pupils are equal, round, and reactive to light.  Cardiovascular:     Rate and Rhythm: Normal rate and regular rhythm.     Pulses: Normal pulses.     Heart sounds: Normal heart sounds.  Pulmonary:     Effort: Pulmonary effort is normal.     Breath sounds: Rales (Dry crackles left lower lobe posteriorly) present.  Abdominal:     Palpations: Abdomen is soft.     Tenderness: There is no abdominal tenderness.  Musculoskeletal:        General: Normal range of motion.     Cervical back: Normal range of motion and neck supple.     Right lower leg: No edema.     Left lower leg: No edema.  Lymphadenopathy:     Cervical: No cervical adenopathy.  Skin:    General: Skin is warm and dry.     Capillary Refill: Capillary refill takes less than 2 seconds.  Neurological:     General: No focal deficit present.     Mental Status: She is alert and oriented to person, place, and time.  Psychiatric:        Mood and Affect: Mood normal.    Results for orders placed or performed in visit on 06/05/20 (from the past 24 hour(s))  POCT glucose (manual entry)     Status: Abnormal   Collection Time: 06/05/20  9:20 AM  Result Value Ref Range   POC Glucose 108 (A) 70 - 99 mg/dl  POCT glycosylated hemoglobin (Hb A1C)     Status: Abnormal   Collection Time: 06/05/20  9:22 AM  Result Value Ref Range   Hemoglobin A1C 7.2 (A) 4.0 - 5.6 %   HbA1c POC (<> result, manual entry)     HbA1c, POC (prediabetic range)     HbA1c, POC (controlled diabetic range)        ASSESSMENT & PLAN: Hypertension associated with diabetes ([place]) Hypertension well-controlled.  Continue present medication.  No changes. Hemoglobin A1c better than before at 7.2.  Continue Metformin, glimepiride, and Januvia.  Diet and nutrition discussed. Continue  statin therapy with Crestor 20 mg daily. Follow-up in 3 months  Evangelina was seen today for hypertension, diabetes and referral for hearing aid.  Diagnoses and all orders for this visit:  Hypertension associated with diabetes ([place]) -     ACCU-CHEK GUIDE test strip; USE 1 STRIP ONCE DAILY -     Accu-Chek Softclix Lancets lancets; SMARTSIG:1 Topical Daily -     amLODipine (NORVASC) 10 MG tablet; Take 1 tablet (10 mg total) by mouth daily. -     glimepiride (AMARYL) 2 MG tablet; Take 1 tablet (2 mg total) by mouth daily with breakfast. TAKE ONE TABLET BY MOUTH WITH BREAKFAST -     metFORMIN (GLUCOPHAGE-XR) 500 MG 24 hr tablet; Take 1 tablet (500 mg total) by mouth 2 (two) times daily with a meal. -     sitaGLIPtin (JANUVIA) 50 MG tablet; Take 1 tablet (50 mg total) by mouth daily. -     POCT glucose (manual entry) -     POCT glycosylated hemoglobin (Hb A1C) -     Microalbumin, urine  Dyslipidemia associated with type 2 diabetes mellitus (HCC) -     rosuvastatin (CRESTOR) 20 MG tablet; Take 1 tablet (20 mg total) by mouth daily.  Chronic lung disease  Hearing loss of left ear, unspecified hearing loss type -     Ambulatory referral to ENT    Patient Instructions       If you have lab work done today you will be contacted with your lab results within the next 2 weeks.  If you have not heard from [place] then please contact us. The fastest way to get your results is to register for My Chart.   IF you received an x-ray today, you will receive an invoice from [facility]  Radiology. Please contact [facility] at [phone] with questions or concerns regarding your invoice.   IF you received labwork today, you will receive an invoice from [facility]. Please contact [facility] at [phone] with questions or concerns regarding your invoice.   Our billing staff will not be able to assist you with questions regarding bills from these companies.  You will be contacted with the lab  results as soon as they are available. The fastest way to get your results is to activate your My Chart account. Instructions are located on the last page of this paperwork. If you have not heard from [place] regarding the results in 2 weeks, please contact this office.      Diabetes Mellitus and Nutrition, Adult When you have diabetes (diabetes mellitus), it is very important to have healthy eating habits because your blood sugar (glucose) levels are greatly affected by what you eat and drink. Eating healthy foods in the appropriate amounts, at about the same times every day, can help you:  Control your blood glucose.  Lower your risk of heart disease.  Improve your blood pressure.  Reach or maintain a healthy weight. Every person with diabetes is different, and each person has different needs for a meal plan. Your health care provider may recommend that you work with a diet and nutrition specialist (dietitian) to make a meal plan that is best for you. Your meal plan may vary depending on factors such as:  The calories you need.  The medicines you take.  Your weight.  Your blood glucose, blood pressure, and cholesterol levels.  Your activity level.  Other health conditions you have, such as heart or kidney disease. How do carbohydrates affect me? Carbohydrates, also called carbs, affect your blood glucose level more than any other type of food. Eating carbs naturally raises the amount of glucose in your blood. Carb counting is a method for keeping track of how many carbs you eat. Counting carbs is important to keep your blood glucose at a healthy level, especially if you use insulin or take certain oral diabetes medicines. It is important to know how many carbs you can safely have in each meal. This is different for every person. Your dietitian can help you calculate how many carbs you should have at each meal and for each snack. Foods that contain carbs include:  Bread, cereal, rice,  pasta, and crackers.  Potatoes and corn.  Peas, beans, and lentils.  Milk and yogurt.  Fruit and juice.  Desserts, such as cakes, cookies, ice cream, and candy. How does alcohol affect me? Alcohol can cause a sudden decrease in blood glucose (hypoglycemia), especially if you use insulin or take certain oral diabetes medicines. Hypoglycemia can be a life-threatening condition. Symptoms of hypoglycemia (sleepiness, dizziness, and confusion) are similar to symptoms of having too much alcohol. If your health care provider says that alcohol is safe for you, follow these guidelines:  Limit alcohol intake to no more than 1 drink per day for nonpregnant women and 2 drinks per day for men. One drink equals 12 oz of beer, 5 oz of wine, or 1 oz of hard liquor.  Do not drink on an empty stomach.  Keep yourself hydrated with water, diet soda, or unsweetened iced tea.  Keep in mind that regular soda, juice, and other mixers may contain a lot of sugar and must be counted as carbs. What are tips for following this plan?  Reading food labels  Start by checking the serving size  on the "Nutrition Facts" label of packaged foods and drinks. The amount of calories, carbs, fats, and other nutrients listed on the label is based on one serving of the item. Many items contain more than one serving per package.  Check the total grams (g) of carbs in one serving. You can calculate the number of servings of carbs in one serving by dividing the total carbs by 15. For example, if a food has 30 g of total carbs, it would be equal to 2 servings of carbs.  Check the number of grams (g) of saturated and trans fats in one serving. Choose foods that have low or no amount of these fats.  Check the number of milligrams (mg) of salt (sodium) in one serving. Most people should limit total sodium intake to less than 2,300 mg per day.  Always check the nutrition information of foods labeled as "low-fat" or "nonfat". These  foods may be higher in added sugar or refined carbs and should be avoided.  Talk to your dietitian to identify your daily goals for nutrients listed on the label. Shopping  Avoid buying canned, premade, or processed foods. These foods tend to be high in fat, sodium, and added sugar.  Shop around the outside edge of the grocery store. This includes fresh fruits and vegetables, bulk grains, fresh meats, and fresh dairy. Cooking  Use low-heat cooking methods, such as baking, instead of high-heat cooking methods like deep frying.  Cook using healthy oils, such as olive, canola, or sunflower oil.  Avoid cooking with butter, cream, or high-fat meats. Meal planning  Eat meals and snacks regularly, preferably at the same times every day. Avoid going long periods of time without eating.  Eat foods high in fiber, such as fresh fruits, vegetables, beans, and whole grains. Talk to your dietitian about how many servings of carbs you can eat at each meal.  Eat 4-6 ounces (oz) of lean protein each day, such as lean meat, chicken, fish, eggs, or tofu. One oz of lean protein is equal to: ? 1 oz of meat, chicken, or fish. ? 1 egg. ?  cup of tofu.  Eat some foods each day that contain healthy fats, such as avocado, nuts, seeds, and fish. Lifestyle  Check your blood glucose regularly.  Exercise regularly as told by your health care provider. This may include: ? 150 minutes of moderate-intensity or vigorous-intensity exercise each week. This could be brisk walking, biking, or water aerobics. ? Stretching and doing strength exercises, such as yoga or weightlifting, at least 2 times a week.  Take medicines as told by your health care provider.  Do not use any products that contain nicotine or tobacco, such as cigarettes and e-cigarettes. If you need help quitting, ask your health care provider.  Work with a Social worker or diabetes educator to identify strategies to manage stress and any emotional and  social challenges. Questions to ask a health care provider  Do I need to meet with a diabetes educator?  Do I need to meet with a dietitian?  What number can I call if I have questions?  When are the best times to check my blood glucose? Where to find more information:  American Diabetes Association: diabetes.org  Academy of Nutrition and Dietetics: www.eatright.[employer] of Diabetes and Digestive and Kidney Diseases (NIH): [employer] Summary  A healthy meal plan will help you control your blood glucose and maintain a healthy lifestyle.  Working with a diet and nutrition specialist (dietitian)  can help you make a meal plan that is best for you.  Keep in mind that carbohydrates (carbs) and alcohol have immediate effects on your blood glucose levels. It is important to count carbs and to use alcohol carefully. This information is not intended to replace advice given to you by your health care provider. Make sure you discuss any questions you have with your health care provider. Document Revised: 10/01/2017 Document Reviewed: 11/23/2016 Elsevier Patient Education  2020 Elsevier Inc.      Agustina Caroli, MD [facility]

## 2020-06-05 NOTE — Assessment & Plan Note (Signed)
Hypertension well-controlled.  Continue present medication.  No changes. Hemoglobin A1c better than before at 7.2.  Continue Metformin, glimepiride, and Januvia.  Diet and nutrition discussed. Continue statin therapy with Crestor 20 mg daily. Follow-up in 3 months

## 2020-06-06 LAB — MICROALBUMIN, URINE: Microalbumin, Urine: 3.6 ug/mL

## 2020-06-07 ENCOUNTER — Telehealth: Payer: Self-pay | Admitting: Emergency Medicine

## 2020-06-07 NOTE — Telephone Encounter (Signed)
Marlowe Kays calling from [facility] specialist regarding recent referral /   Dr Lucia Gaskins does not accept medicaid  But accepts [facility]/ and the audiologist does not accept medicaid or [facility]. So hearing test would be paid for by patient .   May want to send referral to whomever takes both /  [facility] accepts medicaid    Any questions Marlowe Kays at [phone]

## 2020-06-07 NOTE — Telephone Encounter (Signed)
Can you sent this referral to whom ever except Medicaid. Below msg states [facility] may except this ins.

## 2020-06-19 ENCOUNTER — Ambulatory Visit (INDEPENDENT_AMBULATORY_CARE_PROVIDER_SITE_OTHER): Payer: Medicaid Other | Admitting: Internal Medicine

## 2020-06-19 ENCOUNTER — Encounter: Payer: Self-pay | Admitting: Internal Medicine

## 2020-06-19 ENCOUNTER — Other Ambulatory Visit: Payer: Self-pay

## 2020-06-19 VITALS — BP 128/68 | HR 78 | Temp 97.6°F | Ht 68.0 in | Wt 155.0 lb

## 2020-06-19 DIAGNOSIS — Z789 Other specified health status: Secondary | ICD-10-CM | POA: Diagnosis not present

## 2020-06-19 DIAGNOSIS — J479 Bronchiectasis, uncomplicated: Secondary | ICD-10-CM

## 2020-06-19 DIAGNOSIS — H90A32 Mixed conductive and sensorineural hearing loss, unilateral, left ear with restricted hearing on the contralateral side: Secondary | ICD-10-CM | POA: Insufficient documentation

## 2020-06-19 MED ORDER — SPIRIVA RESPIMAT 2.5 MCG/ACT IN AERS
2.0000 | INHALATION_SPRAY | Freq: Every day | RESPIRATORY_TRACT | 0 refills | Status: DC
Start: 2020-06-19 — End: 2022-06-08

## 2020-06-19 NOTE — Patient Instructions (Addendum)
ICD-10-CM   1. Bronchiectasis without complication (HCC)  B28.4 IgE    IgG, IgA, IgM    Viral titer, miscellaneous    Antinuclear Antib (ANA)    Rheumatoid Factor    Cyclic citrul peptide antibody, IgG    Sjogrens syndrome-A extractable nuclear antibody    Sjogrens syndrome-B extractable nuclear antibody    Alpha-1 antitrypsin phenotype    QuantiFERON-TB Gold Plus    Cyclic citrul peptide antibody, IgG    Rheumatoid Factor    Antinuclear Antib (ANA)    IgG, IgA, IgM    IgE  2. Language barrier  Z78.9    You have a condition called bronchiectasis in the left lower lobe. This could be because of the pneumonia infection that you had a year ago when you were hospitalized in [place] for 20 days or it could be a childhood problem  There is no cure for this but if your symptoms are severe at some point we can do surgery but at this point symptoms are mild and therefore we can manage with medicines  Plan  Check blood immunoglobulin A, E, G and M Check titers for pneumococcal vaccine - skip If not available Check blood ANA, rheumatoid factor, CCP, SSA, SSB Check alpha-1 antitrypsin Check QuantiFERON gold   Start Spiriva Respimat 2 puffs once daily  -Take 6-8 weeks sample  -Take this daily  -Use albuterol as needed  Start flutter valve and do 10 times daily  Please talk to PCP Horald Pollen, MD -  and ensure you get  shingrix (GSK) inactivated vaccine against shingles   Follow-up -8-12 weeks with nurse practitioner to report progress and review results

## 2020-06-19 NOTE — Progress Notes (Signed)
OV 06/19/2020  Subjective:  Patient ID: Cheryl Hopkins, female , DOB: 10/27/1951 , age 69 y.o. , MRN: [id number] , ADDRESS: [place] [id number]  PCP Horald Pollen, MD [place] [place] Interpreter: Son  06/19/2020 -   Chief Complaint  Patient presents with  . Consult     HPI Cheryl Hopkins 69 y.o. -presents to pulmonary clinic in [place]. For a face-to-face visit. Has been referred by primary care physician. As best as I can gather patient has a long multiyear history probably in childhood of cough and shortness of breath. There appears to be only minimal sputum production. The shortness of breath is mild with exertion relieved by rest. I am not really sure there is progression. I do not think so. There is no orthopnea proximal nocturnal dyspnea or hemoptysis. There is exertional wheezing. She immigrated from [place] to [place] in 2016. Then in October 2020 during the time of the Covid she was visiting [place] and was hospitalized for 20 days and was on oxygen. Apparently this was definitely not Covid. After that she recovered. She does not think she has lost weight. She is feels she is back at baseline shortness of breath with exertion and wheezing and cough. There is no associated chest pain. This resulted in a CT scan of the chest that shows focal bronchiectasis in the left lower lobe. I personally visualized and agree with the findings. She is asking if this can be cured    Simple office walk 185 feet x  3 laps goal with forehead probe 06/19/2020   O2 used yes  Number laps completed yes  Comments about pace avg  Resting Pulse Ox/HR 99% and 73/min  Final Pulse Ox/HR 92% and 104/min  10   Desaturated <= 3% points yes  Got Tachycardic >/= 90/min yes  Symptoms at end of test no  Miscellaneous comments x      Results for Cheryl, Hopkins (MRN [id number]) as of 06/19/2020 09:06  Ref. Range 03/05/2020 08:28  Creatinine Latest Ref  Range: 0.57 - 1.00 mg/dL 0.71   Results for Cheryl, Hopkins (MRN [id number]) as of 06/19/2020 09:06  Ref. Range 03/05/2020 08:28  EOS (ABSOLUTE) Latest Ref Range: 0.0 - 0.4 x10E3/uL 0.3    CT chest 04/24/20 - personally visualized    Lungs/Pleura: There are cystic changes of the left lower lobe which may represent cystic bronchiectasis. There is inspissated debris or mucous impaction of one of the cysts which may represent a superimposed infection. Findings most consistent with chronic bronchitis/bronchiolitis and cavitary disease. Atypical infection or infection with mycobacterium is also a consideration. Clinical correlation and pulmonary referral is advised. There is otherwise mosaic appearance of the lungs suggestive of underlying small airways versus small vessel disease. There is no pleural effusion or pneumothorax. The central airways are patent.  Upper Abdomen: No acute abnormality.  Musculoskeletal: Degenerative changes of the spine. No acute osseous pathology.  IMPRESSION: Cystic and bronchiectatic changes primarily involving the left lower lobe. Findings most consistent with chronic bronchitis/bronchiolitis and cavitary disease. Atypical infection or infection with Mycobacterium is also a consideration. Clinical correlation and pulmonary referral is advised.   Electronically Signed   By: Anner Crete M.D.   On: 04/25/2020 22:51  ROS - per HPI     has a past medical history of Arthritis, Bronchitis, Diabetes mellitus without complication ([place]), Hyperlipidemia (09/30/2015), Hypertension, and Stomach pain.   reports that she has never smoked.  She has never used smokeless tobacco.  Past Surgical History:  Procedure Laterality Date  . COLONOSCOPY      No Known Allergies  Immunization History  Administered Date(s) Administered  . Influenza,inj,Quad PF,6+ Mos 11/05/2016, 08/14/2019  . Influenza-Unspecified 07/18/2015  . MMR 10/05/2013  .  Meningococcal Conjugate 02/05/2014  . PFIZER SARS-COV-2 Vaccination 02/25/2020, 03/17/2020  . Pneumococcal Conjugate-13 07/30/2019  . Td 10/05/2013  . Varicella 06/28/2013  . Yellow Fever 06/15/2013    Family History  Problem Relation Age of Onset  . Bronchitis Sister   . Hepatitis Daughter   . Hypertension Sister   . Stroke Sister   . Colon cancer Neg Hx      Current Outpatient Medications:  .  ACCU-CHEK GUIDE test strip, USE 1 STRIP ONCE DAILY, Disp: 100 each, Rfl: 7 .  Accu-Chek Softclix Lancets lancets, SMARTSIG:1 Topical Daily, Disp: 100 each, Rfl: 7 .  amLODipine (NORVASC) 10 MG tablet, Take 1 tablet (10 mg total) by mouth daily., Disp: 90 tablet, Rfl: 3 .  Blood Glucose Monitoring Suppl w/Device KIT, 1 Device by Does not apply route daily., Disp: 1 kit, Rfl: 0 .  glimepiride (AMARYL) 2 MG tablet, Take 1 tablet (2 mg total) by mouth daily with breakfast. TAKE ONE TABLET BY MOUTH WITH BREAKFAST, Disp: 90 tablet, Rfl: 3 .  metFORMIN (GLUCOPHAGE-XR) 500 MG 24 hr tablet, Take 1 tablet (500 mg total) by mouth 2 (two) times daily with a meal., Disp: 180 tablet, Rfl: 3 .  rosuvastatin (CRESTOR) 20 MG tablet, Take 1 tablet (20 mg total) by mouth daily., Disp: 90 tablet, Rfl: 3 .  sitaGLIPtin (JANUVIA) 50 MG tablet, Take 1 tablet (50 mg total) by mouth daily., Disp: 90 tablet, Rfl: 3 .  Spacer/Aero-Holding Chambers (AEROCHAMBER PLUS) inhaler, Use as instructed, Disp: 1 each, Rfl: 2      Objective:   Vitals:   06/19/20 0846  BP: 128/68  Pulse: 78  Temp: 97.6 F (36.4 C)  SpO2: 96%  Weight: 155 lb (70.3 kg)  Height: $Remove'5\' 8"'XInQlik$  (1.727 m)    Estimated body mass index is 23.57 kg/m as calculated from the following:   Height as of this encounter: $RemoveBeforeD'5\' 8"'MylVpGXsGuILnG$  (1.727 m).   Weight as of this encounter: 155 lb (70.3 kg).  $Rem'@WEIGHTCHANGE'gYYD$ @  [employer]   06/19/20 0846  Weight: 155 lb (70.3 kg)     Physical Exam  General Appearance:    Alert, cooperative, no distress, appears stated  age - yes , Deconditioned looking - no , OBESE  - no, Sitting on Wheelchair -  no  Head:    Normocephalic, without obvious abnormality, atraumatic  Eyes:    PERRL, conjunctiva/corneas clear,  Ears:    Normal TM's and external ear canals, both ears  Nose:   Nares normal, septum midline, mucosa normal, no drainage    or sinus tenderness. OXYGEN ON  - no . Patient is @ ra   Throat:   Lips, mucosa, and tongue normal; teeth and gums normal. Cyanosis on lips - no. TEETH ? OK DENTITION  Neck:   Supple, symmetrical, trachea midline, no adenopathy;    thyroid:  no enlargement/tenderness/nodules; no carotid   bruit or JVD  Back:     Symmetric, no curvature, ROM normal, no CVA tenderness  Lungs:     Distress - no , Wheeze no, Barrell Chest - no, Purse lip breathing - no, Crackles - ? LEft base   Chest Wall:    No tenderness or deformity.  Heart:    Regular rate and rhythm, S1 and S2 normal, no rub   or gallop, Murmur - no  Breast Exam:    NOT DONE  Abdomen:     Soft, non-tender, bowel sounds active all four quadrants,    no masses, no organomegaly. Visceral obesity - no  Genitalia:   NOT DONE  Rectal:   NOT DONE  Extremities:   Extremities - normal, Has Cane - no, Clubbing - no, Edema - no  Pulses:   2+ and symmetric all extremities  Skin:   Stigmata of Connective Tissue Disease - no  Lymph nodes:   Cervical, supraclavicular, and axillary nodes normal  Psychiatric:  Neurologic:   Pleasant - yes, Anxious - no, Flat affect - no  CAm-ICU - neg, Alert and Oriented x 3 - yes, Moves all 4s - yes, Speech - normal, Cognition - intact           Assessment:       ICD-10-CM   1. Bronchiectasis without complication (HCC)  E70.3 IgE    IgG, IgA, IgM    Antinuclear Antib (ANA)    Rheumatoid Factor    Cyclic citrul peptide antibody, IgG    Sjogrens syndrome-A extractable nuclear antibody    Sjogrens syndrome-B extractable nuclear antibody    Alpha-1 antitrypsin phenotype    QuantiFERON-TB Gold  Plus    Cyclic citrul peptide antibody, IgG    Rheumatoid Factor    Antinuclear Antib (ANA)    IgG, IgA, IgM    IgE    CANCELED: Viral titer, miscellaneous  2. Language barrier  Z78.9        Plan:     Patient Instructions     ICD-10-CM   1. Bronchiectasis without complication (HCC)  J00.9 IgE    IgG, IgA, IgM    Viral titer, miscellaneous    Antinuclear Antib (ANA)    Rheumatoid Factor    Cyclic citrul peptide antibody, IgG    Sjogrens syndrome-A extractable nuclear antibody    Sjogrens syndrome-B extractable nuclear antibody    Alpha-1 antitrypsin phenotype    QuantiFERON-TB Gold Plus    Cyclic citrul peptide antibody, IgG    Rheumatoid Factor    Antinuclear Antib (ANA)    IgG, IgA, IgM    IgE  2. Language barrier  Z78.9    You have a condition called bronchiectasis in the left lower lobe. This could be because of the pneumonia infection that you had a year ago when you were hospitalized in [place] for 20 days or it could be a childhood problem  There is no cure for this but if your symptoms are severe at some point we can do surgery but at this point symptoms are mild and therefore we can manage with medicines  Plan  Check blood immunoglobulin A, E, G and M Check titers for pneumococcal vaccine - skip If not available Check blood ANA, rheumatoid factor, CCP, SSA, SSB Check alpha-1 antitrypsin Check QuantiFERON gold   Start Spiriva Respimat 2 puffs once daily  -Take 6-8 weeks sample  -Take this daily  -Use albuterol as needed  Start flutter valve and do 10 times daily  Please talk to PCP Horald Pollen, MD -  and ensure you get  shingrix (GSK) inactivated vaccine against shingles   Follow-up -8-12 weeks with nurse practitioner to report progress and review results     SIGNATURE    Dr. Brand Males, M.D., F.C.C.P,  Pulmonary and Critical Care Medicine Staff  Physician, [facility] Director - Interstitial Lung Disease  Program    [facility] [place], [place], [address]  Pager: [phone], If no answer or between  15:00h - 7:00h: call [phone] Telephone: [phone]  9:33 AM 06/19/2020

## 2020-06-23 NOTE — Progress Notes (Signed)
Blood work is norma but IgM is low normal. Given wide range of IgM levels - by protocol repeat IgM alone

## 2020-06-28 LAB — ALPHA-1 ANTITRYPSIN PHENOTYPE: A-1 Antitrypsin, Ser: 147 mg/dL (ref 83–199)

## 2020-06-28 LAB — QUANTIFERON-TB GOLD PLUS
Mitogen-NIL: 8.39 IU/mL
NIL: 0.05 IU/mL
QuantiFERON-TB Gold Plus: NEGATIVE
TB1-NIL: <0 IU/mL
TB2-NIL: <0 IU/mL

## 2020-06-28 LAB — SJOGRENS SYNDROME-A EXTRACTABLE NUCLEAR ANTIBODY: SSA (Ro) (ENA) Antibody, IgG: <1 AI

## 2020-06-28 LAB — IGG, IGA, IGM
IgG (Immunoglobin G), Serum: 1331 mg/dL (ref 600–1540)
IgM, Serum: 49 mg/dL — ABNORMAL LOW (ref 50–300)
Immunoglobulin A: 312 mg/dL (ref 70–320)

## 2020-06-28 LAB — ANA: Anti Nuclear Antibody (ANA): NEGATIVE

## 2020-06-28 LAB — SJOGRENS SYNDROME-B EXTRACTABLE NUCLEAR ANTIBODY: SSB (La) (ENA) Antibody, IgG: <1 AI

## 2020-06-28 LAB — IGE: IgE (Immunoglobulin E), Serum: 27 kU/L (ref <=114)

## 2020-06-28 LAB — CYCLIC CITRUL PEPTIDE ANTIBODY, IGG: Cyclic Citrullin Peptide Ab: <16 UNITS

## 2020-06-28 LAB — RHEUMATOID FACTOR: Rheumatoid fact SerPl-aCnc: <14 IU/mL (ref <14)

## 2020-07-05 ENCOUNTER — Telehealth: Payer: Self-pay | Admitting: Internal Medicine

## 2020-07-05 NOTE — Telephone Encounter (Signed)
She can come and do repeat IgM anytime

## 2020-07-10 ENCOUNTER — Other Ambulatory Visit: Payer: Self-pay | Admitting: *Deleted

## 2020-07-10 DIAGNOSIS — J479 Bronchiectasis, uncomplicated: Secondary | ICD-10-CM

## 2020-07-17 ENCOUNTER — Other Ambulatory Visit: Payer: No Typology Code available for payment source

## 2020-07-17 DIAGNOSIS — J479 Bronchiectasis, uncomplicated: Secondary | ICD-10-CM

## 2020-07-18 LAB — IGM: IgM, Serum: 51 mg/dL (ref 50–300)

## 2020-08-13 NOTE — Progress Notes (Signed)
$'@Patient'y$  ID: Cheryl Hopkins, female    DOB: Mar 19, 1951, 69 y.o.   MRN: [id number]  Chief Complaint  Patient presents with  . Follow-up    bronchiectesis     Referring provider: Horald Pollen, *  HPI:  69 year old female never smoker found our office for bronchiectasis  PMH: Dyslipidemia, hypertension Smoker/ Smoking History: Never smoker Maintenance: Spiriva Respimat 2.5 Pt of: Dr. Chase Caller  08/14/2020  - Visit   69 year old female never smoker initially consulted with our practice in August/2021 with Dr. Chase Caller.  She immigrated from [place] to [place] in [place] in 2016.  In October/2020 during the time of COVID-19 pandemic she was visiting [place] and was hospitalized for 20 days and was on oxygen.  Per documentation this was not believed to be Covid.  A CT scan of the chest showed focal bronchiectasis of the left lower lobe.  Plan of care from last office visit was as follows: Lab work, start Spiriva Respimat 2.5, start flutter valve, follow-up in 8 to 12 weeks with NP.  Patient reports she is been doing well since last being seen.  She is been using her flutter valve 1 time daily.  Around 5 to 10 breaths.  She still unable to bring up any mucus.  She reports that she continues to have a dry cough.  She was previously using her Spiriva Respimat 2.5.  She is unsure if this actually helped with her breathing.  She ran out of the sample yesterday.  She is unsure if she should remain on this.  She does not have breakthrough reflux symptoms.  She denies any fevers, congestion, allergies, rhinitis symptoms.  Patient is accompanied with her family member today who is helping translate.  Patient is up-to-date with Vaccinations.  She would like to receive her seasonal flu vaccine.  Questionaires / Pulmonary Flowsheets:   ACT:  No flowsheet data found.  MMRC: No flowsheet data found.  [facility]worth:  No flowsheet data found.  Tests:    06/19/2020-IgG, IgA, IgM-312,  IgG-1331, IgM serum-low normal 49  07/17/2020-IgM-51  06/19/2020-IgE E-27 06/19/2020-ANA-negative 06/19/2020-rheumatoid factor-negative 06/19/2020-CCP-negative 06/19/2021-quant to [facility] gold test negative 06/19/2020-alpha-1 antitrypsin deficiency-147, phenotype PI*MM 06/19/2020-organ/negative  04/25/2020-CT chest without contrast-cystic and bronchiectatic changes primarily involving the left lower lobe.  Findings most consistent with chronic bronchitis/bronchiolitis and cavitary disease, atypical infection or infection with Mycobacterium is also consideration, clinical correlation and pulmonary referral is advised  FENO:  No results found for: NITRICOXIDE  PFT: No flowsheet data found.  WALK:  SIX MIN WALK 06/19/2020  Tech Comments: Walked at moderate pace. No complaints of shortness of breath/fatigue. Ramaswamy, MD aware and orders for labs rcvd    Imaging: No results found.  Lab Results:  CBC    Component Value Date/Time   WBC 7.8 03/05/2020 0828   RBC 5.53 (H) 03/05/2020 0828   HGB 15.0 03/05/2020 0828   HCT 46.8 (H) 03/05/2020 0828   PLT 359 03/05/2020 0828   MCV 85 03/05/2020 0828   MCH 27.1 03/05/2020 0828   MCHC 32.1 03/05/2020 0828   RDW 13.3 03/05/2020 0828   LYMPHSABS 4.1 (H) 03/05/2020 0828   EOSABS 0.3 03/05/2020 0828   BASOSABS 0.1 03/05/2020 0828    BMET    Component Value Date/Time   NA 143 03/05/2020 0828   K 4.0 03/05/2020 0828   CL 100 03/05/2020 0828   CO2 27 03/05/2020 0828   GLUCOSE 145 (H) 03/05/2020 0828   BUN 8 03/05/2020 0828   CREATININE 0.71 03/05/2020  1610   CALCIUM 9.7 03/05/2020 0828   GFRNONAA 88 03/05/2020 0828   GFRAA 102 03/05/2020 0828    BNP No results found for: BNP  ProBNP No results found for: PROBNP  Specialty Problems      Pulmonary Problems   Chronic lung disease   Solitary pulmonary nodule   Bronchiectasis without complication (HCC)      No Known Allergies  Immunization History  Administered Date(s)  Administered  . Influenza,inj,Quad PF,6+ Mos 11/05/2016, 08/14/2019  . Influenza-Unspecified 07/18/2015  . MMR 10/05/2013  . Meningococcal Conjugate 02/05/2014  . PFIZER SARS-COV-2 Vaccination 02/25/2020, 03/17/2020  . Pneumococcal Conjugate-13 07/30/2019  . Td 10/05/2013  . Varicella 06/28/2013  . Yellow Fever 06/15/2013    Past Medical History:  Diagnosis Date  . Arthritis    knee  . Bronchitis   . Diabetes mellitus without complication ([place])    Several years.  . Hyperlipidemia 09/30/2015  . Hypertension   . Stomach pain    with diarrhea    Tobacco History: Social History   Tobacco Use  Smoking Status Never Smoker  Smokeless Tobacco Never Used  Tobacco Comment   Chews cola nut   Counseling given: Yes Comment: Chews cola nut   Continue to not smoke  Outpatient Encounter Medications as of 08/14/2020  Medication Sig  . ACCU-CHEK GUIDE test strip USE 1 STRIP ONCE DAILY  . Accu-Chek Softclix Lancets lancets SMARTSIG:1 Topical Daily  . amLODipine (NORVASC) 10 MG tablet Take 1 tablet (10 mg total) by mouth daily.  . Blood Glucose Monitoring Suppl w/Device KIT 1 Device by Does not apply route daily.  Marland Kitchen glimepiride (AMARYL) 2 MG tablet Take 1 tablet (2 mg total) by mouth daily with breakfast. TAKE ONE TABLET BY MOUTH WITH BREAKFAST  . metFORMIN (GLUCOPHAGE-XR) 500 MG 24 hr tablet Take 1 tablet (500 mg total) by mouth 2 (two) times daily with a meal.  . rosuvastatin (CRESTOR) 20 MG tablet Take 1 tablet (20 mg total) by mouth daily.  . sitaGLIPtin (JANUVIA) 50 MG tablet Take 1 tablet (50 mg total) by mouth daily.  Marland Kitchen Spacer/Aero-Holding Chambers (AEROCHAMBER PLUS) inhaler Use as instructed  . Tiotropium Bromide Monohydrate (SPIRIVA RESPIMAT) 2.5 MCG/ACT AERS Inhale 2 puffs into the lungs daily.   No facility-administered encounter medications on file as of 08/14/2020.     Review of Systems  Review of Systems  Constitutional: Positive for fatigue. Negative for  activity change and fever.  HENT: Negative for sinus pressure, sinus pain and sore throat.   Respiratory: Positive for cough (dry cough ). Negative for shortness of breath and wheezing.   Cardiovascular: Negative for chest pain and palpitations.  Gastrointestinal: Negative for diarrhea, nausea and vomiting.  Musculoskeletal: Negative for arthralgias.  Neurological: Negative for dizziness.  Psychiatric/Behavioral: Negative for sleep disturbance. The patient is not nervous/anxious.      Physical Exam  BP 108/60   Pulse 65   Temp (!) 96.9 F (36.1 C) (Oral)   Ht $R'5\' 8"'GA$  (1.727 m)   Wt 158 lb 9.6 oz (71.9 kg)   LMP 11/02/2010 (Approximate) Comment: postmenopausal  SpO2 94%   BMI 24.12 kg/m   Wt Readings from Last 5 Encounters:  08/14/20 158 lb 9.6 oz (71.9 kg)  06/19/20 155 lb (70.3 kg)  06/05/20 153 lb 9.6 oz (69.7 kg)  03/05/20 153 lb (69.4 kg)  08/14/19 156 lb 9.6 oz (71 kg)    BMI Readings from Last 5 Encounters:  08/14/20 24.12 kg/m  06/19/20 23.57 kg/m  06/05/20 23.35 kg/m  03/05/20 23.26 kg/m  08/14/19 23.81 kg/m     Physical Exam Vitals and nursing note reviewed.  Constitutional:      General: She is not in acute distress.    Appearance: Normal appearance. She is normal weight.  HENT:     Head: Normocephalic and atraumatic.     Right Ear: External ear normal.     Left Ear: External ear normal.     Nose: Nose normal. No congestion or rhinorrhea.     Mouth/Throat:     Mouth: Mucous membranes are moist.     Pharynx: Oropharynx is clear.  Eyes:     Pupils: Pupils are equal, round, and reactive to light.  Cardiovascular:     Rate and Rhythm: Normal rate and regular rhythm.     Pulses: Normal pulses.     Heart sounds: Normal heart sounds. No murmur heard.   Pulmonary:     Effort: Pulmonary effort is normal. No respiratory distress.     Breath sounds: Normal breath sounds. No decreased air movement. No decreased breath sounds, wheezing or rales.    Musculoskeletal:     Cervical back: Normal range of motion.  Skin:    General: Skin is warm and dry.     Capillary Refill: Capillary refill takes less than 2 seconds.  Neurological:     General: No focal deficit present.     Mental Status: She is alert and oriented to person, place, and time. Mental status is at baseline.     Gait: Gait normal.  Psychiatric:        Mood and Affect: Mood normal.        Behavior: Behavior normal.        Thought Content: Thought content normal.        Judgment: Judgment normal.       Assessment & Plan:   Bronchiectasis without complication (HCC) Plan: Increase flutter valve use to 2 times daily, 10 breaths each time Reviewed pathophysiology of bronchiectasis Patient encouraged to contact our office if she develops a productive cough, fevers, increase shortness of breath We will obtain pulmonary function testing Okay to hold off on Spiriva Respimat 2.5 at this point in time since patient did not notice clinical improvement  Language barrier Continues to be a barrier for the patient.  Patient's family member present in visit today helping translate  Healthcare maintenance Plan: Seasonal flu vaccine today    Return in about 6 weeks (around 09/25/2020), or if symptoms worsen or fail to improve, for Follow up for FULL PFT - 60 min, Follow up with Dr. Purnell Shoemaker.   Lauraine Rinne, NP 08/14/2020   This appointment required 24 minutes of patient care (this includes precharting, chart review, review of results, face-to-face care, etc.).

## 2020-08-14 ENCOUNTER — Encounter: Payer: Self-pay | Admitting: Emergency Medicine

## 2020-08-14 ENCOUNTER — Ambulatory Visit (INDEPENDENT_AMBULATORY_CARE_PROVIDER_SITE_OTHER): Payer: No Typology Code available for payment source | Admitting: Pulmonary Disease

## 2020-08-14 ENCOUNTER — Ambulatory Visit (INDEPENDENT_AMBULATORY_CARE_PROVIDER_SITE_OTHER): Payer: Medicaid Other | Admitting: Emergency Medicine

## 2020-08-14 ENCOUNTER — Other Ambulatory Visit: Payer: Self-pay

## 2020-08-14 ENCOUNTER — Encounter: Payer: Self-pay | Admitting: Pulmonary Disease

## 2020-08-14 VITALS — BP 110/61 | HR 76 | Temp 97.0°F | Resp 16 | Ht 68.0 in | Wt 160.0 lb

## 2020-08-14 VITALS — BP 108/60 | HR 65 | Temp 96.9°F | Ht 68.0 in | Wt 158.6 lb

## 2020-08-14 DIAGNOSIS — E1169 Type 2 diabetes mellitus with other specified complication: Secondary | ICD-10-CM

## 2020-08-14 DIAGNOSIS — Z789 Other specified health status: Secondary | ICD-10-CM | POA: Insufficient documentation

## 2020-08-14 DIAGNOSIS — I152 Hypertension secondary to endocrine disorders: Secondary | ICD-10-CM

## 2020-08-14 DIAGNOSIS — Z23 Encounter for immunization: Secondary | ICD-10-CM

## 2020-08-14 DIAGNOSIS — J479 Bronchiectasis, uncomplicated: Secondary | ICD-10-CM

## 2020-08-14 DIAGNOSIS — E785 Hyperlipidemia, unspecified: Secondary | ICD-10-CM

## 2020-08-14 DIAGNOSIS — Z13820 Encounter for screening for osteoporosis: Secondary | ICD-10-CM

## 2020-08-14 DIAGNOSIS — E1159 Type 2 diabetes mellitus with other circulatory complications: Secondary | ICD-10-CM

## 2020-08-14 DIAGNOSIS — Z603 Acculturation difficulty: Secondary | ICD-10-CM

## 2020-08-14 DIAGNOSIS — Z0000 Encounter for general adult medical examination without abnormal findings: Secondary | ICD-10-CM

## 2020-08-14 DIAGNOSIS — Z1231 Encounter for screening mammogram for malignant neoplasm of breast: Secondary | ICD-10-CM

## 2020-08-14 LAB — POCT GLYCOSYLATED HEMOGLOBIN (HGB A1C): Hemoglobin A1C: 7.2 % — AB (ref 4.0–5.6)

## 2020-08-14 LAB — GLUCOSE, POCT (MANUAL RESULT ENTRY): POC Glucose: 155 mg/dl — AB (ref 70–99)

## 2020-08-14 MED ORDER — ROSUVASTATIN CALCIUM 20 MG PO TABS: 20.0000 mg | ORAL_TABLET | Freq: Every day | ORAL | 1 refills | Status: DC

## 2020-08-14 MED ORDER — GLIMEPIRIDE 2 MG PO TABS: 2.0000 mg | ORAL_TABLET | Freq: Every day | ORAL | 1 refills | Status: DC

## 2020-08-14 MED ORDER — AMLODIPINE BESYLATE 10 MG PO TABS: 10.0000 mg | ORAL_TABLET | Freq: Every day | ORAL | 1 refills | Status: DC

## 2020-08-14 MED ORDER — METFORMIN HCL ER 500 MG PO TB24: 500.0000 mg | ORAL_TABLET | Freq: Two times a day (BID) | ORAL | 1 refills | Status: DC

## 2020-08-14 MED ORDER — SITAGLIPTIN PHOSPHATE 50 MG PO TABS: 50.0000 mg | ORAL_TABLET | Freq: Every day | ORAL | 1 refills | Status: DC

## 2020-08-14 NOTE — Progress Notes (Signed)
Cheryl Hopkins 69 y.o.   Chief Complaint  Patient presents with  . Diabetes    follow up 3 month  . Hypertension  . Medication Refill    HISTORY OF PRESENT ILLNESS: This is a 69 y.o. female with history of diabetes and hypertension here for follow-up and medication refill. Has no complaints or medical concerns today.  Here with son helping with translation. Traveling to [place] soon for 6 months. Fully vaccinated against Covid.  HPI   Prior to Admission medications   Medication Sig Start Date End Date Taking? Authorizing Provider  amLODipine (NORVASC) 10 MG tablet Take 1 tablet (10 mg total) by mouth daily. 06/05/20 09/03/20 Yes Dariana Garbett, Ines Bloomer, MD  glimepiride (AMARYL) 2 MG tablet Take 1 tablet (2 mg total) by mouth daily with breakfast. TAKE ONE TABLET BY MOUTH WITH BREAKFAST 06/05/20 09/03/20 Yes Keiran Gaffey, Ines Bloomer, MD  metFORMIN (GLUCOPHAGE-XR) 500 MG 24 hr tablet Take 1 tablet (500 mg total) by mouth 2 (two) times daily with a meal. 06/05/20 09/03/20 Yes Kavya Haag, Ines Bloomer, MD  rosuvastatin (CRESTOR) 20 MG tablet Take 1 tablet (20 mg total) by mouth daily. 06/05/20 09/03/20 Yes Amyrie Illingworth, Ines Bloomer, MD  sitaGLIPtin (JANUVIA) 50 MG tablet Take 1 tablet (50 mg total) by mouth daily. 06/05/20 09/03/20 Yes [place], Ines Bloomer, MD  Spacer/Aero-Holding Chambers (AEROCHAMBER PLUS) inhaler Use as instructed 07/30/19  Yes Melynda Ripple, MD  ACCU-CHEK GUIDE test strip USE 1 STRIP ONCE DAILY 06/05/20   Horald Pollen, MD  Accu-Chek Softclix Lancets lancets SMARTSIG:1 Topical Daily 06/05/20   Horald Pollen, MD  Blood Glucose Monitoring Suppl w/Device KIT 1 Device by Does not apply route daily. 05/08/20   Horald Pollen, MD  Tiotropium Bromide Monohydrate (SPIRIVA RESPIMAT) 2.5 MCG/ACT AERS Inhale 2 puffs into the lungs daily. Patient not taking: Reported on 08/14/2020 06/19/20   Brand Males, MD    No Known Allergies  Patient Active Problem List   Diagnosis Date  Noted  . Bronchiectasis without complication ([place]) [id number]  . Language barrier 08/14/2020  . Healthcare maintenance 08/14/2020  . Solitary pulmonary nodule 03/05/2020  . Chronic pain of both knees 03/29/2019  . Chronic lung disease 10/31/2015  . Hypertension associated with diabetes ([place]) 10/31/2015  . Dyslipidemia associated with type 2 diabetes mellitus ([place]) 09/30/2015  . Hyperlipidemia 09/30/2015    Past Medical History:  Diagnosis Date  . Arthritis    knee  . Bronchitis   . Diabetes mellitus without complication ([place])    Several years.  . Hyperlipidemia 09/30/2015  . Hypertension   . Stomach pain    with diarrhea    Past Surgical History:  Procedure Laterality Date  . COLONOSCOPY      Social History   Socioeconomic History  . Marital status: Widowed    Spouse name: Widowed since 2010  . Number of children: 9  . Years of education: <8  . Highest education level: Not on file  Occupational History  . Occupation: Housewife  Tobacco Use  . Smoking status: Never Smoker  . Smokeless tobacco: Never Used  . Tobacco comment: Chews cola nut  Substance and Sexual Activity  . Alcohol use: No    Alcohol/week: 0.0 standard drinks  . Drug use: No  . Sexual activity: Not Currently  Other Topics Concern  . Not on file  Social History Narrative      Widow   Originally from [place] - Came to [place]. In 2016   Lives with son and his wife  and their 3 children.        Exercise - no exercise      Seatbelt - 100%   Gun in home - no   Social Determinants of Health   Financial Resource Strain:   . Difficulty of Paying Living Expenses: Not on file  Food Insecurity:   . Worried About Charity fundraiser in the Last Year: Not on file  . Ran Out of Food in the Last Year: Not on file  Transportation Needs:   . Lack of Transportation (Medical): Not on file  . Lack of Transportation (Non-Medical): Not on file  Physical Activity:   . Days of Exercise per Week: Not on file  .  Minutes of Exercise per Session: Not on file  Stress:   . Feeling of Stress : Not on file  Social Connections:   . Frequency of Communication with Friends and Family: Not on file  . Frequency of Social Gatherings with Friends and Family: Not on file  . Attends Religious Services: Not on file  . Active Member of Clubs or Organizations: Not on file  . Attends Archivist Meetings: Not on file  . Marital Status: Not on file  Intimate Partner Violence:   . Fear of Current or Ex-Partner: Not on file  . Emotionally Abused: Not on file  . Physically Abused: Not on file  . Sexually Abused: Not on file    Family History  Problem Relation Age of Onset  . Bronchitis Sister   . Hepatitis Daughter   . Hypertension Sister   . Stroke Sister   . Colon cancer Neg Hx      Review of Systems  Constitutional: Negative.  Negative for chills and fever.  HENT: Negative.  Negative for congestion and sore throat.   Respiratory: Negative.  Negative for cough and shortness of breath.   Cardiovascular: Negative.  Negative for chest pain and palpitations.  Gastrointestinal: Negative.  Negative for abdominal pain, diarrhea, nausea and vomiting.  Genitourinary: Negative.  Negative for dysuria and hematuria.  Musculoskeletal: Negative.  Negative for back pain, myalgias and neck pain.  Skin: Negative.  Negative for rash.  Neurological: Negative.  Negative for dizziness and headaches.  All other systems reviewed and are negative.    Today's Vitals   08/14/20 1609  BP: 110/61  Pulse: 76  Resp: 16  Temp: (!) 97 F (36.1 C)  TempSrc: Temporal  SpO2: 96%  Weight: 160 lb (72.6 kg)  Height: $Remove'5\' 8"'uXpCUwN$  (1.727 m)   Body mass index is 24.33 kg/m.   Physical Exam Vitals reviewed.  Constitutional:      Appearance: Normal appearance.  HENT:     Head: Normocephalic.  Eyes:     Extraocular Movements: Extraocular movements intact.     Conjunctiva/sclera: Conjunctivae normal.  Cardiovascular:      Rate and Rhythm: Normal rate and regular rhythm.     Pulses: Normal pulses.     Heart sounds: Normal heart sounds.  Pulmonary:     Effort: Pulmonary effort is normal.     Breath sounds: Normal breath sounds.  Musculoskeletal:        General: Normal range of motion.     Cervical back: Normal range of motion and neck supple.  Skin:    General: Skin is warm and dry.     Capillary Refill: Capillary refill takes less than 2 seconds.  Neurological:     General: No focal deficit present.     Mental Status:  She is alert and oriented to person, place, and time.  Psychiatric:        Mood and Affect: Mood normal.        Behavior: Behavior normal.    Results for orders placed or performed in visit on 08/14/20 (from the past 24 hour(s))  POCT glucose (manual entry)     Status: Abnormal   Collection Time: 08/14/20  4:36 PM  Result Value Ref Range   POC Glucose 155 (A) 70 - 99 mg/dl  POCT glycosylated hemoglobin (Hb A1C)     Status: Abnormal   Collection Time: 08/14/20  4:41 PM  Result Value Ref Range   Hemoglobin A1C 7.2 (A) 4.0 - 5.6 %   HbA1c POC (<> result, manual entry)     HbA1c, POC (prediabetic range)     HbA1c, POC (controlled diabetic range)       ASSESSMENT & PLAN: Cheryl Hopkins was seen today for diabetes, hypertension and medication refill.  Diagnoses and all orders for this visit:  Hypertension associated with diabetes ([place]) -     POCT glucose (manual entry) -     POCT glycosylated hemoglobin (Hb A1C) -     amLODipine (NORVASC) 10 MG tablet; Take 1 tablet (10 mg total) by mouth daily. -     glimepiride (AMARYL) 2 MG tablet; Take 1 tablet (2 mg total) by mouth daily with breakfast. TAKE ONE TABLET BY MOUTH WITH BREAKFAST -     metFORMIN (GLUCOPHAGE-XR) 500 MG 24 hr tablet; Take 1 tablet (500 mg total) by mouth 2 (two) times daily with a meal. -     sitaGLIPtin (JANUVIA) 50 MG tablet; Take 1 tablet (50 mg total) by mouth daily.  Dyslipidemia associated with type 2 diabetes  mellitus ([facility]) -     rosuvastatin (CRESTOR) 20 MG tablet; Take 1 tablet (20 mg total) by mouth daily.  Need for prophylactic vaccination against Streptococcus pneumoniae (pneumococcus) -     Pneumococcal polysaccharide vaccine 23-valent greater than or equal to 2yo subcutaneous/IM  Encounter for screening mammogram for malignant neoplasm of breast -     MM Digital Screening; Future  Osteoporosis screening -     HM DEXA SCAN     Patient Instructions       If you have lab work done today you will be contacted with your lab results within the next 2 weeks.  If you have not heard from [place] then please contact us. The fastest way to get your results is to register for My Chart.   IF you received an x-ray today, you will receive an invoice from [facility]. Please contact [facility] at [phone] with questions or concerns regarding your invoice.   IF you received labwork today, you will receive an invoice from [facility]. Please contact [facility] at [phone] with questions or concerns regarding your invoice.   Our billing staff will not be able to assist you with questions regarding bills from these companies.  You will be contacted with the lab results as soon as they are available. The fastest way to get your results is to activate your My Chart account. Instructions are located on the last page of this paperwork. If you have not heard from [place] regarding the results in 2 weeks, please contact this office.     Diabetes Mellitus and Nutrition, Adult When you have diabetes (diabetes mellitus), it is very important to have healthy eating habits because your blood sugar (glucose) levels are greatly affected by what you eat and  drink. Eating healthy foods in the appropriate amounts, at about the same times every day, can help you:  Control your blood glucose.  Lower your risk of heart disease.  Improve your blood pressure.  Reach or maintain a healthy  weight. Every person with diabetes is different, and each person has different needs for a meal plan. Your health care provider may recommend that you work with a diet and nutrition specialist (dietitian) to make a meal plan that is best for you. Your meal plan may vary depending on factors such as:  The calories you need.  The medicines you take.  Your weight.  Your blood glucose, blood pressure, and cholesterol levels.  Your activity level.  Other health conditions you have, such as heart or kidney disease. How do carbohydrates affect me? Carbohydrates, also called carbs, affect your blood glucose level more than any other type of food. Eating carbs naturally raises the amount of glucose in your blood. Carb counting is a method for keeping track of how many carbs you eat. Counting carbs is important to keep your blood glucose at a healthy level, especially if you use insulin or take certain oral diabetes medicines. It is important to know how many carbs you can safely have in each meal. This is different for every person. Your dietitian can help you calculate how many carbs you should have at each meal and for each snack. Foods that contain carbs include:  Bread, cereal, rice, pasta, and crackers.  Potatoes and corn.  Peas, beans, and lentils.  Milk and yogurt.  Fruit and juice.  Desserts, such as cakes, cookies, ice cream, and candy. How does alcohol affect me? Alcohol can cause a sudden decrease in blood glucose (hypoglycemia), especially if you use insulin or take certain oral diabetes medicines. Hypoglycemia can be a life-threatening condition. Symptoms of hypoglycemia (sleepiness, dizziness, and confusion) are similar to symptoms of having too much alcohol. If your health care provider says that alcohol is safe for you, follow these guidelines:  Limit alcohol intake to no more than 1 drink per day for nonpregnant women and 2 drinks per day for men. One drink equals 12 oz of  beer, 5 oz of wine, or 1 oz of hard liquor.  Do not drink on an empty stomach.  Keep yourself hydrated with water, diet soda, or unsweetened iced tea.  Keep in mind that regular soda, juice, and other mixers may contain a lot of sugar and must be counted as carbs. What are tips for following this plan?  Reading food labels  Start by checking the serving size on the "Nutrition Facts" label of packaged foods and drinks. The amount of calories, carbs, fats, and other nutrients listed on the label is based on one serving of the item. Many items contain more than one serving per package.  Check the total grams (g) of carbs in one serving. You can calculate the number of servings of carbs in one serving by dividing the total carbs by 15. For example, if a food has 30 g of total carbs, it would be equal to 2 servings of carbs.  Check the number of grams (g) of saturated and trans fats in one serving. Choose foods that have low or no amount of these fats.  Check the number of milligrams (mg) of salt (sodium) in one serving. Most people should limit total sodium intake to less than 2,300 mg per day.  Always check the nutrition information of foods labeled as "low-fat"  or "nonfat". These foods may be higher in added sugar or refined carbs and should be avoided.  Talk to your dietitian to identify your daily goals for nutrients listed on the label. Shopping  Avoid buying canned, premade, or processed foods. These foods tend to be high in fat, sodium, and added sugar.  Shop around the outside edge of the grocery store. This includes fresh fruits and vegetables, bulk grains, fresh meats, and fresh dairy. Cooking  Use low-heat cooking methods, such as baking, instead of high-heat cooking methods like deep frying.  Cook using healthy oils, such as olive, canola, or sunflower oil.  Avoid cooking with butter, cream, or high-fat meats. Meal planning  Eat meals and snacks regularly, preferably at  the same times every day. Avoid going long periods of time without eating.  Eat foods high in fiber, such as fresh fruits, vegetables, beans, and whole grains. Talk to your dietitian about how many servings of carbs you can eat at each meal.  Eat 4-6 ounces (oz) of lean protein each day, such as lean meat, chicken, fish, eggs, or tofu. One oz of lean protein is equal to: ? 1 oz of meat, chicken, or fish. ? 1 egg. ?  cup of tofu.  Eat some foods each day that contain healthy fats, such as avocado, nuts, seeds, and fish. Lifestyle  Check your blood glucose regularly.  Exercise regularly as told by your health care provider. This may include: ? 150 minutes of moderate-intensity or vigorous-intensity exercise each week. This could be brisk walking, biking, or water aerobics. ? Stretching and doing strength exercises, such as yoga or weightlifting, at least 2 times a week.  Take medicines as told by your health care provider.  Do not use any products that contain nicotine or tobacco, such as cigarettes and e-cigarettes. If you need help quitting, ask your health care provider.  Work with a Social worker or diabetes educator to identify strategies to manage stress and any emotional and social challenges. Questions to ask a health care provider  Do I need to meet with a diabetes educator?  Do I need to meet with a dietitian?  What number can I call if I have questions?  When are the best times to check my blood glucose? Where to find more information:  American Diabetes Association: diabetes.org  Academy of Nutrition and Dietetics: www.eatright.[employer] of Diabetes and Digestive and Kidney Diseases (NIH): [employer] Summary  A healthy meal plan will help you control your blood glucose and maintain a healthy lifestyle.  Working with a diet and nutrition specialist (dietitian) can help you make a meal plan that is best for you.  Keep in mind that carbohydrates  (carbs) and alcohol have immediate effects on your blood glucose levels. It is important to count carbs and to use alcohol carefully. This information is not intended to replace advice given to you by your health care provider. Make sure you discuss any questions you have with your health care provider. Document Revised: 10/01/2017 Document Reviewed: 11/23/2016 Elsevier Patient Education  2020 Elsevier Inc.      Agustina Caroli, MD [facility]

## 2020-08-14 NOTE — Assessment & Plan Note (Signed)
Plan: Seasonal flu vaccine today 

## 2020-08-14 NOTE — Assessment & Plan Note (Signed)
Plan: Increase flutter valve use to 2 times daily, 10 breaths each time Reviewed pathophysiology of bronchiectasis Patient encouraged to contact our office if she develops a productive cough, fevers, increase shortness of breath We will obtain pulmonary function testing Okay to hold off on Spiriva Respimat 2.5 at this point in time since patient did not notice clinical improvement

## 2020-08-14 NOTE — Patient Instructions (Addendum)
You were seen today by Lauraine Rinne, NP  for:   1. Bronchiectasis without complication ([place])  - Pulmonary function test; Future  Bronchiectasis: This is the medical term which indicates that you have damage, dilated airways making you more susceptible to respiratory infection. Use a flutter valve 10 breaths twice a day or 4 to 5 breaths 4-5 times a day to help clear mucus out Let us know if you have cough with change in mucus color or fevers or chills.  At that point you would need an antibiotic. Maintain a healthy nutritious diet, eating whole foods Take your medications as prescribed   If your cough becomes productive please contact our office so that we can obtain sputum samples  If you develop fevers, increased fatigue, loss of appetite please contact our office because you may need a chest x-ray  2. Language barrier  Please continue to bring family members to your office visits to help with translation  3. Healthcare maintenance  Seasonal flu vaccine    We recommend today:  Orders Placed This Encounter  Procedures  . Pulmonary function test    Standing Status:   Future    Standing Expiration Date:   08/14/2021    Order Specific Question:   Where should this test be performed?    Answer:   [facility]    Order Specific Question:   Full PFT: includes the following: basic spirometry, spirometry pre & post bronchodilator, diffusion capacity (DLCO), lung volumes    Answer:   Full PFT   Orders Placed This Encounter  Procedures  . Pulmonary function test   No orders of the defined types were placed in this encounter.   Follow Up:    Return in about 6 weeks (around 09/25/2020), or if symptoms worsen or fail to improve, for Follow up for FULL PFT - 60 min, Follow up with Dr. Purnell Shoemaker.   Notification of test results are managed in the following manner: If there are  any recommendations or changes to the  plan of care discussed in office today,  we will contact you  and let you know what they are. If you do not hear from [place], then your results are normal and you can view them through your  MyChart account , or a letter will be sent to you. Thank you again for trusting [place] with your care  - Thank you, [facility]    It is flu season:   >>> Best ways to protect herself from the flu: Receive the yearly flu vaccine, practice good hand hygiene washing with soap and also using hand sanitizer when available, eat a nutritious meals, get adequate rest, hydrate appropriately       Please contact the office if your symptoms worsen or you have concerns that you are not improving.   Thank you for choosing [facility] for your healthcare, and for allowing [place] to partner with you on your healthcare journey. I am thankful to be able to provide care to you today.   Wyn Quaker FNP-C   Influenza Virus Vaccine injection What is this medicine? INFLUENZA VIRUS VACCINE (in floo EN zuh VAHY ruhs vak SEEN) helps to reduce the risk of getting influenza also known as the flu. The vaccine only helps protect you against some strains of the flu. This medicine may be used for other purposes; ask your health care provider or pharmacist if you have questions. COMMON BRAND NAME(S): Afluria, Afluria Quadrivalent, Agriflu, Alfuria, FLUAD, Fluarix, Fluarix  Quadrivalent, Flublok, Flublok Quadrivalent, FLUCELVAX, FLUCELVAX Quadrivalent, Flulaval, Flulaval Quadrivalent, Fluvirin, Fluzone, Fluzone High-Dose, Fluzone Intradermal, Fluzone Quadrivalent What should I tell my health care provider before I take this medicine? They need to know if you have any of these conditions:  bleeding disorder like hemophilia  fever or infection  Guillain-Barre syndrome or other neurological problems  immune system problems  infection with the human immunodeficiency virus (HIV) or AIDS  low blood platelet counts  multiple sclerosis  an unusual or allergic reaction to influenza  virus vaccine, latex, other medicines, foods, dyes, or preservatives. Different brands of vaccines contain different allergens. Some may contain latex or eggs. Talk to your doctor about your allergies to make sure that you get the right vaccine.  pregnant or trying to get pregnant  breast-feeding How should I use this medicine? This vaccine is for injection into a muscle or under the skin. It is given by a health care professional. A copy of Vaccine Information Statements will be given before each vaccination. Read this sheet carefully each time. The sheet may change frequently. Talk to your healthcare provider to see which vaccines are right for you. Some vaccines should not be used in all age groups. Overdosage: If you think you have taken too much of this medicine contact a poison control center or emergency room at once. NOTE: This medicine is only for you. Do not share this medicine with others. What if I miss a dose? This does not apply. What may interact with this medicine?  chemotherapy or radiation therapy  medicines that lower your immune system like etanercept, anakinra, infliximab, and adalimumab  medicines that treat or prevent blood clots like warfarin  phenytoin  steroid medicines like prednisone or cortisone  theophylline  vaccines This list may not describe all possible interactions. Give your health care provider a list of all the medicines, herbs, non-prescription drugs, or dietary supplements you use. Also tell them if you smoke, drink alcohol, or use illegal drugs. Some items may interact with your medicine. What should I watch for while using this medicine? Report any side effects that do not go away within 3 days to your doctor or health care professional. Call your health care provider if any unusual symptoms occur within 6 weeks of receiving this vaccine. You may still catch the flu, but the illness is not usually as bad. You cannot get the flu from the  vaccine. The vaccine will not protect against colds or other illnesses that may cause fever. The vaccine is needed every year. What side effects may I notice from receiving this medicine? Side effects that you should report to your doctor or health care professional as soon as possible:  allergic reactions like skin rash, itching or hives, swelling of the face, lips, or tongue Side effects that usually do not require medical attention (report to your doctor or health care professional if they continue or are bothersome):  fever  headache  muscle aches and pains  pain, tenderness, redness, or swelling at the injection site  tiredness This list may not describe all possible side effects. Call your doctor for medical advice about side effects. You may report side effects to FDA at 1-800-FDA-1088. Where should I keep my medicine? The vaccine will be given by a health care professional in a clinic, pharmacy, doctor's office, or other health care setting. You will not be given vaccine doses to store at home. NOTE: This sheet is a summary. It may not cover all possible information.  If you have questions about this medicine, talk to your doctor, pharmacist, or health care provider.  2020 Elsevier/Gold Standard (2018-09-13 08:45:43)     Bronchiectasis  Bronchiectasis is a condition in which the airways in the lungs (bronchi) are damaged and widened. The condition makes it hard for the lungs to get rid of mucus, and it causes mucus to gather in the bronchi. This condition often leads to lung infections, which can make the condition worse. What are the causes? You can be born with this condition or you can develop it later in life. Common causes of this condition include:  Cystic fibrosis.  Repeated lung infections, such as pneumonia or tuberculosis.  An object or other blockage in the lungs.  Breathing in fluid, food, or other objects (aspiration).  A problem with the immune system and  lung structure that is present at birth (congenital). Sometimes the cause is not known. What are the signs or symptoms? Common symptoms of this condition include:  A daily cough that brings up mucus and lasts for more than 3 weeks.  Lung infections that happen often.  Shortness of breath and wheezing.  Weakness and fatigue. How is this diagnosed? This condition is diagnosed with tests, such as:  Chest X-rays or CT scans. These are done to check for changes in the lungs.  Breathing tests. These are done to check how well your lungs are working.  A test of a sample of your saliva (sputum culture). This test is done to check for infection.  Blood tests and other tests. These are done to check for related diseases or causes. How is this treated? Treatment for this condition depends on the severity of the illness and its cause. Treatment may include:  Medicines that loosen mucus so it can be coughed up (expectorants).  Medicines that relax the muscles of the bronchi (bronchodilators).  Antibiotic medicines to prevent or treat infection.  Physical therapy to help clear mucus from the lungs. Techniques may include: ? Postural drainage. This is when you sit or lie in certain positions so that mucus can drain by gravity. ? Chest percussion. This involves tapping the chest or back with a cupped hand. ? Chest vibration. For this therapy, a hand or special equipment vibrates your chest and back.  Surgery to remove the affected part of the lung. This may be done in severe cases. Follow these instructions at home: Medicines  Take over-the-counter and prescription medicines only as told by your health care provider.  If you were prescribed an antibiotic medicine, take it as told by your health care provider. Do not stop taking the antibiotic even if you start to feel better.  Avoid taking sedatives and antihistamines unless your health care provider tells you to take them. These  medicines tend to thicken the mucus in the lungs. Managing symptoms  Perform breathing exercises or techniques to clear your lungs as told by your health care provider.  Consider using a cold steam vaporizer or humidifier in your room or home to help loosen secretions.  If you have a cough that gets worse at night, try sleeping in a semi-upright position. General instructions  Get plenty of rest.  Drink enough fluid to keep your urine clear or pale yellow.  Stay inside when pollution and ozone levels are high.  Stay up to date with vaccinations and immunizations.  Avoid cigarette smoke and other lung irritants.  Do not use any products that contain nicotine or tobacco, such as cigarettes and  e-cigarettes. If you need help quitting, ask your health care provider.  Keep all follow-up visits as told by your health care provider. This is important. Contact a health care provider if:  You cough up more sputum than before and the sputum is yellow or green in color.  You have a fever.  You cannot control your cough and are losing sleep. Get help right away if:  You cough up blood.  You have chest pain.  You have increasing shortness of breath.  You have pain that gets worse or is not controlled with medicines.  You have a fever and your symptoms suddenly get worse. Summary  Bronchiectasis is a condition in which the airways in the lungs (bronchi) are damaged and widened. The condition makes it hard for the lungs to get rid of mucus, and it causes mucus to gather in the bronchi.  Treatment usually includes therapy to help clear mucus from the lungs.  Stay up to date with vaccinations and immunizations. This information is not intended to replace advice given to you by your health care provider. Make sure you discuss any questions you have with your health care provider. Document Revised: 10/01/2017 Document Reviewed: 11/23/2016 Elsevier Patient Education  2020 [employer].

## 2020-08-14 NOTE — Assessment & Plan Note (Signed)
Continues to be a barrier for the patient.  Patient's family member present in visit today helping translate

## 2020-08-14 NOTE — Patient Instructions (Addendum)
   If you have lab work done today you will be contacted with your lab results within the next 2 weeks.  If you have not heard from us then please contact us. The fastest way to get your results is to register for My Chart.   IF you received an x-ray today, you will receive an invoice from [facility]. Please contact  Radiology at [phone] with questions or concerns regarding your invoice.   IF you received labwork today, you will receive an invoice from [facility]. Please contact [facility] at [phone] with questions or concerns regarding your invoice.   Our billing staff will not be able to assist you with questions regarding bills from these companies.  You will be contacted with the lab results as soon as they are available. The fastest way to get your results is to activate your My Chart account. Instructions are located on the last page of this paperwork. If you have not heard from us regarding the results in 2 weeks, please contact this office.     Diabetes Mellitus and Nutrition, Adult When you have diabetes (diabetes mellitus), it is very important to have healthy eating habits because your blood sugar (glucose) levels are greatly affected by what you eat and drink. Eating healthy foods in the appropriate amounts, at about the same times every day, can help you:  Control your blood glucose.  Lower your risk of heart disease.  Improve your blood pressure.  Reach or maintain a healthy weight. Every person with diabetes is different, and each person has different needs for a meal plan. Your health care provider may recommend that you work with a diet and nutrition specialist (dietitian) to make a meal plan that is best for you. Your meal plan may vary depending on factors such as:  The calories you need.  The medicines you take.  Your weight.  Your blood glucose, blood pressure, and cholesterol levels.  Your activity level.  Other health conditions  you have, such as heart or kidney disease. How do carbohydrates affect me? Carbohydrates, also called carbs, affect your blood glucose level more than any other type of food. Eating carbs naturally raises the amount of glucose in your blood. Carb counting is a method for keeping track of how many carbs you eat. Counting carbs is important to keep your blood glucose at a healthy level, especially if you use insulin or take certain oral diabetes medicines. It is important to know how many carbs you can safely have in each meal. This is different for every person. Your dietitian can help you calculate how many carbs you should have at each meal and for each snack. Foods that contain carbs include:  Bread, cereal, rice, pasta, and crackers.  Potatoes and corn.  Peas, beans, and lentils.  Milk and yogurt.  Fruit and juice.  Desserts, such as cakes, cookies, ice cream, and candy. How does alcohol affect me? Alcohol can cause a sudden decrease in blood glucose (hypoglycemia), especially if you use insulin or take certain oral diabetes medicines. Hypoglycemia can be a life-threatening condition. Symptoms of hypoglycemia (sleepiness, dizziness, and confusion) are similar to symptoms of having too much alcohol. If your health care provider says that alcohol is safe for you, follow these guidelines:  Limit alcohol intake to no more than 1 drink per day for nonpregnant women and 2 drinks per day for men. One drink equals 12 oz of beer, 5 oz of wine, or 1 oz of hard liquor.    Do not drink on an empty stomach.  Keep yourself hydrated with water, diet soda, or unsweetened iced tea.  Keep in mind that regular soda, juice, and other mixers may contain a lot of sugar and must be counted as carbs. What are tips for following this plan?  Reading food labels  Start by checking the serving size on the "Nutrition Facts" label of packaged foods and drinks. The amount of calories, carbs, fats, and other  nutrients listed on the label is based on one serving of the item. Many items contain more than one serving per package.  Check the total grams (g) of carbs in one serving. You can calculate the number of servings of carbs in one serving by dividing the total carbs by 15. For example, if a food has 30 g of total carbs, it would be equal to 2 servings of carbs.  Check the number of grams (g) of saturated and trans fats in one serving. Choose foods that have low or no amount of these fats.  Check the number of milligrams (mg) of salt (sodium) in one serving. Most people should limit total sodium intake to less than 2,300 mg per day.  Always check the nutrition information of foods labeled as "low-fat" or "nonfat". These foods may be higher in added sugar or refined carbs and should be avoided.  Talk to your dietitian to identify your daily goals for nutrients listed on the label. Shopping  Avoid buying canned, premade, or processed foods. These foods tend to be high in fat, sodium, and added sugar.  Shop around the outside edge of the grocery store. This includes fresh fruits and vegetables, bulk grains, fresh meats, and fresh dairy. Cooking  Use low-heat cooking methods, such as baking, instead of high-heat cooking methods like deep frying.  Cook using healthy oils, such as olive, canola, or sunflower oil.  Avoid cooking with butter, cream, or high-fat meats. Meal planning  Eat meals and snacks regularly, preferably at the same times every day. Avoid going long periods of time without eating.  Eat foods high in fiber, such as fresh fruits, vegetables, beans, and whole grains. Talk to your dietitian about how many servings of carbs you can eat at each meal.  Eat 4-6 ounces (oz) of lean protein each day, such as lean meat, chicken, fish, eggs, or tofu. One oz of lean protein is equal to: ? 1 oz of meat, chicken, or fish. ? 1 egg. ?  cup of tofu.  Eat some foods each day that contain  healthy fats, such as avocado, nuts, seeds, and fish. Lifestyle  Check your blood glucose regularly.  Exercise regularly as told by your health care provider. This may include: ? 150 minutes of moderate-intensity or vigorous-intensity exercise each week. This could be brisk walking, biking, or water aerobics. ? Stretching and doing strength exercises, such as yoga or weightlifting, at least 2 times a week.  Take medicines as told by your health care provider.  Do not use any products that contain nicotine or tobacco, such as cigarettes and e-cigarettes. If you need help quitting, ask your health care provider.  Work with a counselor or diabetes educator to identify strategies to manage stress and any emotional and social challenges. Questions to ask a health care provider  Do I need to meet with a diabetes educator?  Do I need to meet with a dietitian?  What number can I call if I have questions?  When are the best times to   times to check my blood glucose? Where to find more information:  American Diabetes Association: diabetes.org  Academy of Nutrition and Dietetics: www.eatright.org  National Institute of Diabetes and Digestive and Kidney Diseases (NIH): www.niddk.nih.gov Summary  A healthy meal plan will help you control your blood glucose and maintain a healthy lifestyle.  Working with a diet and nutrition specialist (dietitian) can help you make a meal plan that is best for you.  Keep in mind that carbohydrates (carbs) and alcohol have immediate effects on your blood glucose levels. It is important to count carbs and to use alcohol carefully. This information is not intended to replace advice given to you by your health care provider. Make sure you discuss any questions you have with your health care provider. Document Revised: 10/01/2017 Document Reviewed: 11/23/2016 [employer]vier Patient Education  2020 [employer]  

## 2020-08-28 ENCOUNTER — Ambulatory Visit: Payer: Medicaid Other | Admitting: Emergency Medicine

## 2020-10-30 ENCOUNTER — Telehealth: Payer: Self-pay | Admitting: Emergency Medicine

## 2020-10-30 NOTE — Telephone Encounter (Signed)
10/30/2020 - PATIENT HAD A 6 MONTH FOLLOW-UP WITH DR. Irving Shows ON Wednesday 02/12/2021 ON HER DIABETES AND HYPERTENSION. DR. Irving Shows WILL BE ON VACATION. I TRIED TO CALL AND RESCHEDULE BUT HAD TO LEAVE A MESSAGE ON HER VOICE MAIL TO RETURN MY CALL. I WILL CANCEL HER OFF THE SCHEDULE ON THIS DATE. MBC

## 2020-12-13 ENCOUNTER — Ambulatory Visit: Payer: No Typology Code available for payment source | Admitting: Internal Medicine

## 2021-02-12 ENCOUNTER — Ambulatory Visit: Payer: Medicaid Other | Admitting: Emergency Medicine

## 2021-02-13 NOTE — Telephone Encounter (Signed)
Opened in error

## 2021-04-23 ENCOUNTER — Ambulatory Visit (INDEPENDENT_AMBULATORY_CARE_PROVIDER_SITE_OTHER): Payer: No Typology Code available for payment source | Admitting: Emergency Medicine

## 2021-04-23 ENCOUNTER — Other Ambulatory Visit: Payer: Self-pay

## 2021-04-23 ENCOUNTER — Encounter: Payer: Self-pay | Admitting: Emergency Medicine

## 2021-04-23 VITALS — BP 110/60 | HR 77 | Temp 98.6°F | Ht 68.0 in | Wt 156.0 lb

## 2021-04-23 DIAGNOSIS — E785 Hyperlipidemia, unspecified: Secondary | ICD-10-CM | POA: Diagnosis not present

## 2021-04-23 DIAGNOSIS — E1159 Type 2 diabetes mellitus with other circulatory complications: Secondary | ICD-10-CM | POA: Diagnosis not present

## 2021-04-23 DIAGNOSIS — E1169 Type 2 diabetes mellitus with other specified complication: Secondary | ICD-10-CM

## 2021-04-23 DIAGNOSIS — R918 Other nonspecific abnormal finding of lung field: Secondary | ICD-10-CM

## 2021-04-23 DIAGNOSIS — Z0001 Encounter for general adult medical examination with abnormal findings: Secondary | ICD-10-CM

## 2021-04-23 DIAGNOSIS — J479 Bronchiectasis, uncomplicated: Secondary | ICD-10-CM

## 2021-04-23 DIAGNOSIS — Z1231 Encounter for screening mammogram for malignant neoplasm of breast: Secondary | ICD-10-CM

## 2021-04-23 DIAGNOSIS — I152 Hypertension secondary to endocrine disorders: Secondary | ICD-10-CM

## 2021-04-23 DIAGNOSIS — Z87448 Personal history of other diseases of urinary system: Secondary | ICD-10-CM | POA: Insufficient documentation

## 2021-04-23 LAB — POCT GLYCOSYLATED HEMOGLOBIN (HGB A1C): Hemoglobin A1C: 7.2 % — AB (ref 4.0–5.6)

## 2021-04-23 MED ORDER — AEROCHAMBER PLUS MISC: 2 refills | Status: DC

## 2021-04-23 MED ORDER — GLIMEPIRIDE 2 MG PO TABS: 2.0000 mg | ORAL_TABLET | Freq: Every day | ORAL | 1 refills | Status: DC

## 2021-04-23 MED ORDER — ROSUVASTATIN CALCIUM 20 MG PO TABS: 20.0000 mg | ORAL_TABLET | Freq: Every day | ORAL | 1 refills | Status: DC

## 2021-04-23 MED ORDER — ACCU-CHEK SOFTCLIX LANCETS MISC: 7 refills | Status: DC

## 2021-04-23 MED ORDER — AMLODIPINE BESYLATE 10 MG PO TABS: 10.0000 mg | ORAL_TABLET | Freq: Every day | ORAL | 1 refills | Status: DC

## 2021-04-23 MED ORDER — METFORMIN HCL ER 500 MG PO TB24: 500.0000 mg | ORAL_TABLET | Freq: Two times a day (BID) | ORAL | 1 refills | Status: DC

## 2021-04-23 MED ORDER — SITAGLIPTIN PHOSPHATE 50 MG PO TABS: 50.0000 mg | ORAL_TABLET | Freq: Every day | ORAL | 1 refills | Status: DC

## 2021-04-23 NOTE — Progress Notes (Signed)
Cheryl Hopkins 70 y.o.   Chief Complaint  Patient presents with   Annual Exam   Lab Results  Component Value Date   HGBA1C 7.2 (A) 08/14/2020   BP Readings from Last 3 Encounters:  04/23/21 110/60  08/14/20 110/61  08/14/20 108/60    HISTORY OF PRESENT ILLNESS: This is a 70 y.o. female here for annual exam.  Son helping with translation. Has history of diabetes on glimepiride 2 mg daily, metformin 500 mg twice a day and Januvia 50 mg daily. History of dyslipidemia on rosuvastatin 20 mg daily History of hypertension on amlodipine 10 mg daily. Was recently in [place] and CT scan of abdomen and pelvis showed a left renal cyst Also has a history of bronchiectasis without complications at present time.  Has been evaluated by pulmonary medicine in the past. Most recent CT of chest and office visit as follows: Assessment & Plan:    Bronchiectasis without complication ([place]) Plan: Increase flutter valve use to 2 times daily, 10 breaths each time Reviewed pathophysiology of bronchiectasis Patient encouraged to contact our office if she develops a productive cough, fevers, increase shortness of breath We will obtain pulmonary function testing Okay to hold off on Spiriva Respimat 2.5 at this point in time since patient did not notice clinical improvement   Language barrier Continues to be a barrier for the patient.  Patient's family member present in visit today helping translate   Healthcare maintenance Plan: Seasonal flu vaccine today       Return in about 6 weeks (around 09/25/2020), or if symptoms worsen or fail to improve, for Follow up for FULL PFT - 60 min, Follow up with Dr. Purnell Shoemaker.     Lauraine Rinne, NP 08/14/2020    IMPRESSION: Cystic and bronchiectatic changes primarily involving the left lower lobe. Findings most consistent with chronic bronchitis/bronchiolitis and cavitary disease. Atypical infection or infection with Mycobacterium is also a consideration.  Clinical correlation and pulmonary referral is advised.     Electronically Signed   By: Anner Crete M.D.   On: 04/25/2020 22:51   HPI   Prior to Admission medications   Medication Sig Start Date End Date Taking? Authorizing Provider  Tiotropium Bromide Monohydrate (SPIRIVA RESPIMAT) 2.5 MCG/ACT AERS Inhale 2 puffs into the lungs daily. 06/19/20  Yes Brand Males, MD  ACCU-CHEK GUIDE test strip USE 1 STRIP ONCE DAILY 06/05/20   Horald Pollen, MD  Accu-Chek Softclix Lancets lancets SMARTSIG:1 Topical Daily 06/05/20   Horald Pollen, MD  amLODipine (NORVASC) 10 MG tablet Take 1 tablet (10 mg total) by mouth daily. 08/14/20 02/10/21  Horald Pollen, MD  Blood Glucose Monitoring Suppl w/Device KIT 1 Device by Does not apply route daily. 05/08/20   Horald Pollen, MD  glimepiride (AMARYL) 2 MG tablet Take 1 tablet (2 mg total) by mouth daily with breakfast. TAKE ONE TABLET BY MOUTH WITH BREAKFAST 08/14/20 02/10/21  Horald Pollen, MD  metFORMIN (GLUCOPHAGE-XR) 500 MG 24 hr tablet Take 1 tablet (500 mg total) by mouth 2 (two) times daily with a meal. 08/14/20 02/10/21  Treyton Slimp, Ines Bloomer, MD  rosuvastatin (CRESTOR) 20 MG tablet Take 1 tablet (20 mg total) by mouth daily. 08/14/20 02/10/21  Horald Pollen, MD  sitaGLIPtin (JANUVIA) 50 MG tablet Take 1 tablet (50 mg total) by mouth daily. 08/14/20 02/10/21  Horald Pollen, MD  Spacer/Aero-Holding Chambers (AEROCHAMBER PLUS) inhaler Use as instructed Patient not taking: Reported on 04/23/2021 07/30/19   Melynda Ripple, MD  Not on File  Patient Active Problem List   Diagnosis Date Noted   Bronchiectasis without complication ([place]) 56/38/7564   Language barrier 08/14/2020   Healthcare maintenance 08/14/2020   Solitary pulmonary nodule 03/05/2020   Chronic pain of both knees 03/29/2019   Chronic lung disease 10/31/2015   Hypertension associated with diabetes ([place]) 10/31/2015   Dyslipidemia  associated with type 2 diabetes mellitus ([place]) 09/30/2015   Hyperlipidemia 09/30/2015    Past Medical History:  Diagnosis Date   Arthritis    knee   Bronchitis    Diabetes mellitus without complication ([place])    Several years.   Hyperlipidemia 09/30/2015   Hypertension    Stomach pain    with diarrhea    Past Surgical History:  Procedure Laterality Date   COLONOSCOPY      Social History   Socioeconomic History   Marital status: Widowed    Spouse name: Widowed since 2010   Number of children: 9   Years of education: <8   Highest education level: Not on file  Occupational History   Occupation: Housewife  Tobacco Use   Smoking status: Never   Smokeless tobacco: Never   Tobacco comments:    Chews cola nut  Substance and Sexual Activity   Alcohol use: No    Alcohol/week: 0.0 standard drinks   Drug use: No   Sexual activity: Not Currently  Other Topics Concern   Not on file  Social History Narrative      Widow   Originally from [place] - Came to [place]. In 2016   Lives with son and his wife and their 3 children.        Exercise - no exercise      Seatbelt - 100%   Gun in home - no   Social Determinants of Health   Financial Resource Strain: Not on file  Food Insecurity: Not on file  Transportation Needs: Not on file  Physical Activity: Not on file  Stress: Not on file  Social Connections: Not on file  Intimate Partner Violence: Not on file    Family History  Problem Relation Age of Onset   Bronchitis Sister    Hepatitis Daughter    Hypertension Sister    Stroke Sister    Colon cancer Neg Hx      Review of Systems  Constitutional: Negative.  Negative for chills and fever.  HENT: Negative.  Negative for congestion and sore throat.   Respiratory: Negative.  Negative for cough, hemoptysis, sputum production, shortness of breath and wheezing.   Cardiovascular: Negative.  Negative for chest pain and palpitations.  Gastrointestinal:  Negative for  abdominal pain, blood in stool, diarrhea, melena, nausea and vomiting.  Genitourinary: Negative.  Negative for dysuria and hematuria.  Musculoskeletal: Negative.  Negative for joint pain and myalgias.  Skin: Negative.  Negative for rash.  Neurological: Negative.  Negative for dizziness and headaches.  All other systems reviewed and are negative.  Today's Vitals   04/23/21 1507  BP: 110/60  Pulse: 77  Temp: 98.6 F (37 C)  TempSrc: Oral  SpO2: 96%  Weight: 156 lb (70.8 kg)  Height: $Remove'5\' 8"'[id number]$  (1.727 m)   Body mass index is 23.72 kg/m. Wt Readings from Last 3 Encounters:  04/23/21 156 lb (70.8 kg)  08/14/20 160 lb (72.6 kg)  08/14/20 158 lb 9.6 oz (71.9 kg)    Physical Exam Vitals reviewed.  Constitutional:      Appearance: Normal appearance.  HENT:  Head: Normocephalic.     Right Ear: Tympanic membrane, ear canal and external ear normal.     Left Ear: Tympanic membrane, ear canal and external ear normal.  Eyes:     Extraocular Movements: Extraocular movements intact.     Conjunctiva/sclera: Conjunctivae normal.     Pupils: Pupils are equal, round, and reactive to light.  Neck:     Vascular: No carotid bruit.  Cardiovascular:     Rate and Rhythm: Normal rate and regular rhythm.     Pulses: Normal pulses.     Heart sounds: Normal heart sounds.  Pulmonary:     Effort: Pulmonary effort is normal.     Breath sounds: Rales (Dry crackles bilaterally) present.  Abdominal:     General: Bowel sounds are normal. There is no distension.     Palpations: Abdomen is soft.     Tenderness: There is no abdominal tenderness.  Musculoskeletal:     Cervical back: Normal range of motion and neck supple. No tenderness.     Right lower leg: No edema.     Left lower leg: No edema.  Lymphadenopathy:     Cervical: No cervical adenopathy.  Skin:    General: Skin is warm and dry.     Capillary Refill: Capillary refill takes less than 2 seconds.  Neurological:     General: No focal  deficit present.     Mental Status: She is alert and oriented to person, place, and time.  Psychiatric:        Mood and Affect: Mood normal.        Behavior: Behavior normal.     ASSESSMENT & PLAN: Cheryl Hopkins was seen today for annual exam.  Diagnoses and all orders for this visit:  Encounter for general adult medical examination with abnormal findings  Dyslipidemia associated with type 2 diabetes mellitus (HCC) -     rosuvastatin (CRESTOR) 20 MG tablet; Take 1 tablet (20 mg total) by mouth daily. -     POCT glycosylated hemoglobin (Hb A1C) -     TSH -     Lipid panel  Hypertension associated with diabetes ([facility]CC) -     amLODipine (NORVASC) 10 MG tablet; Take 1 tablet (10 mg total) by mouth daily. -     glimepiride (AMARYL) 2 MG tablet; Take 1 tablet (2 mg total) by mouth daily with breakfast. TAKE ONE TABLET BY MOUTH WITH BREAKFAST -     metFORMIN (GLUCOPHAGE-XR) 500 MG 24 hr tablet; Take 1 tablet (500 mg total) by mouth 2 (two) times daily with a meal. -     sitaGLIPtin (JANUVIA) 50 MG tablet; Take 1 tablet (50 mg total) by mouth daily. -     Accu-Chek Softclix Lancets lancets; SMARTSIG:1 Topical Daily -     Comprehensive metabolic panel -     CBC with Differential/Platelet  Encounter for screening mammogram for malignant neoplasm of breast  Lung field abnormal finding on examination -     Cancel: DG Chest 2 View; Future  Bronchiectasis without complication ([place])  History of simple renal cyst -     US Renal; Future  Other orders -     Spacer/Aero-Holding Chambers (AEROCHAMBER PLUS) inhaler; Use as instructed  Modifiable risk factors discussed with patient. Anticipatory guidance according to age provided. The following topics were discussed: Smoking Need to obtain left kidney ultrasound to assess renal cyst Diet and nutrition and needs to decrease amount of daily carbohydrate intake. Review of diabetes and hemoglobin A1c is 7.2.  Review of all medications. Benefits of  exercise  Cancer screening and need for colonoscopy and mammogram.  Colonoscopy report from 2018 reviewed. Vaccinations Cardiovascular risk assessment Mental health including depression and anxiety Fall and accident prevention  Patient Instructions  Health Maintenance, Female Adopting a healthy lifestyle and getting preventive care are important in promoting health and wellness. Ask your health care provider about: The right schedule for you to have regular tests and exams. Things you can do on your own to prevent diseases and keep yourself healthy. What should I know about diet, weight, and exercise? Eat a healthy diet  Eat a diet that includes plenty of vegetables, fruits, low-fat dairy products, and lean protein. Do not eat a lot of foods that are high in solid fats, added sugars, or sodium.  Maintain a healthy weight Body mass index (BMI) is used to identify weight problems. It estimates body fat based on height and weight. Your health care provider can help determineyour BMI and help you achieve or maintain a healthy weight. Get regular exercise Get regular exercise. This is one of the most important things you can do for your health. Most adults should: Exercise for at least 150 minutes each week. The exercise should increase your heart rate and make you sweat (moderate-intensity exercise). Do strengthening exercises at least twice a week. This is in addition to the moderate-intensity exercise. Spend less time sitting. Even light physical activity can be beneficial. Watch cholesterol and blood lipids Have your blood tested for lipids and cholesterol at 67 years of age, then havethis test every 5 years. Have your cholesterol levels checked more often if: Your lipid or cholesterol levels are high. You are older than 68 years of age. You are at high risk for heart disease. What should I know about cancer screening? Depending on your health history and family history, you may need  to have cancer screening at various ages. This may include screening for: Breast cancer. Cervical cancer. Colorectal cancer. Skin cancer. Lung cancer. What should I know about heart disease, diabetes, and high blood pressure? Blood pressure and heart disease High blood pressure causes heart disease and increases the risk of stroke. This is more likely to develop in people who have high blood pressure readings, are of African descent, or are overweight. Have your blood pressure checked: Every 3-5 years if you are 43-37 years of age. Every year if you are 87 years old or older. Diabetes Have regular diabetes screenings. This checks your fasting blood sugar level. Have the screening done: Once every three years after age 34 if you are at a normal weight and have a low risk for diabetes. More often and at a younger age if you are overweight or have a high risk for diabetes. What should I know about preventing infection? Hepatitis B If you have a higher risk for hepatitis B, you should be screened for this virus. Talk with your health care provider to find out if you are at risk forhepatitis B infection. Hepatitis C Testing is recommended for: Everyone born from [birth year] through 1965. Anyone with known risk factors for hepatitis C. Sexually transmitted infections (STIs) Get screened for STIs, including gonorrhea and chlamydia, if: You are sexually active and are younger than 68 years of age. You are older than 68 years of age and your health care provider tells you that you are at risk for this type of infection. Your sexual activity has changed since you were last screened, and you are at  increased risk for chlamydia or gonorrhea. Ask your health care provider if you are at risk. Ask your health care provider about whether you are at high risk for HIV. Your health care provider may recommend a prescription medicine to help prevent HIV infection. If you choose to take medicine to prevent HIV, you  should first get tested for HIV. You should then be tested every 3 months for as long as you are taking the medicine. Pregnancy If you are about to stop having your period (premenopausal) and you may become pregnant, seek counseling before you get pregnant. Take 400 to 800 micrograms (mcg) of folic acid every day if you become pregnant. Ask for birth control (contraception) if you want to prevent pregnancy. Osteoporosis and menopause Osteoporosis is a disease in which the bones lose minerals and strength with aging. This can result in bone fractures. If you are 40 years old or older, or if you are at risk for osteoporosis and fractures, ask your health care provider if you should: Be screened for bone loss. Take a calcium or vitamin D supplement to lower your risk of fractures. Be given hormone replacement therapy (HRT) to treat symptoms of menopause. Follow these instructions at home: Lifestyle Do not use any products that contain nicotine or tobacco, such as cigarettes, e-cigarettes, and chewing tobacco. If you need help quitting, ask your health care provider. Do not use street drugs. Do not share needles. Ask your health care provider for help if you need support or information about quitting drugs. Alcohol use Do not drink alcohol if: Your health care provider tells you not to drink. You are pregnant, may be pregnant, or are planning to become pregnant. If you drink alcohol: Limit how much you use to 0-1 drink a day. Limit intake if you are breastfeeding. Be aware of how much alcohol is in your drink. In the [place] one drink equals one 12 oz bottle of beer (355 mL), one 5 oz glass of wine (148 mL), or one 1 oz glass of hard liquor (44 mL). General instructions Schedule regular health, dental, and eye exams. Stay current with your vaccines. Tell your health care provider if: You often feel depressed. You have ever been abused or do not feel safe at home. Summary Adopting a healthy  lifestyle and getting preventive care are important in promoting health and wellness. Follow your health care provider's instructions about healthy diet, exercising, and getting tested or screened for diseases. Follow your health care provider's instructions on monitoring your cholesterol and blood pressure. This information is not intended to replace advice given to you by your health care provider. Make sure you discuss any questions you have with your healthcare provider. Document Revised: 10/12/2018 Document Reviewed: 10/12/2018 Elsevier Patient Education  2022 [place], MD [facility]

## 2021-04-23 NOTE — Patient Instructions (Signed)
Health Maintenance, Female Adopting a healthy lifestyle and getting preventive care are important in promoting health and wellness. Ask your health care provider about: The right schedule for you to have regular tests and exams. Things you can do on your own to prevent diseases and keep yourself healthy. What should I know about diet, weight, and exercise? Eat a healthy diet  Eat a diet that includes plenty of vegetables, fruits, low-fat dairy products, and lean protein. Do not eat a lot of foods that are high in solid fats, added sugars, or sodium.  Maintain a healthy weight Body mass index (BMI) is used to identify weight problems. It estimates body fat based on height and weight. Your health care provider can help determineyour BMI and help you achieve or maintain a healthy weight. Get regular exercise Get regular exercise. This is one of the most important things you can do for your health. Most adults should: Exercise for at least 150 minutes each week. The exercise should increase your heart rate and make you sweat (moderate-intensity exercise). Do strengthening exercises at least twice a week. This is in addition to the moderate-intensity exercise. Spend less time sitting. Even light physical activity can be beneficial. Watch cholesterol and blood lipids Have your blood tested for lipids and cholesterol at 68 years of age, then havethis test every 5 years. Have your cholesterol levels checked more often if: Your lipid or cholesterol levels are high. You are older than 68 years of age. You are at high risk for heart disease. What should I know about cancer screening? Depending on your health history and family history, you may need to have cancer screening at various ages. This may include screening for: Breast cancer. Cervical cancer. Colorectal cancer. Skin cancer. Lung cancer. What should I know about heart disease, diabetes, and high blood pressure? Blood pressure and heart  disease High blood pressure causes heart disease and increases the risk of stroke. This is more likely to develop in people who have high blood pressure readings, are of African descent, or are overweight. Have your blood pressure checked: Every 3-5 years if you are 18-39 years of age. Every year if you are 40 years old or older. Diabetes Have regular diabetes screenings. This checks your fasting blood sugar level. Have the screening done: Once every three years after age 40 if you are at a normal weight and have a low risk for diabetes. More often and at a younger age if you are overweight or have a high risk for diabetes. What should I know about preventing infection? Hepatitis B If you have a higher risk for hepatitis B, you should be screened for this virus. Talk with your health care provider to find out if you are at risk forhepatitis B infection. Hepatitis C Testing is recommended for: Everyone born from 1945 through 1965. Anyone with known risk factors for hepatitis C. Sexually transmitted infections (STIs) Get screened for STIs, including gonorrhea and chlamydia, if: You are sexually active and are younger than 68 years of age. You are older than 68 years of age and your health care provider tells you that you are at risk for this type of infection. Your sexual activity has changed since you were last screened, and you are at increased risk for chlamydia or gonorrhea. Ask your health care provider if you are at risk. Ask your health care provider about whether you are at high risk for HIV. Your health care provider may recommend a prescription medicine to help   prevent HIV infection. If you choose to take medicine to prevent HIV, you should first get tested for HIV. You should then be tested every 3 months for as long as you are taking the medicine. Pregnancy If you are about to stop having your period (premenopausal) and you may become pregnant, seek counseling before you get  pregnant. Take 400 to 800 micrograms (mcg) of folic acid every day if you become pregnant. Ask for birth control (contraception) if you want to prevent pregnancy. Osteoporosis and menopause Osteoporosis is a disease in which the bones lose minerals and strength with aging. This can result in bone fractures. If you are 65 years old or older, or if you are at risk for osteoporosis and fractures, ask your health care provider if you should: Be screened for bone loss. Take a calcium or vitamin D supplement to lower your risk of fractures. Be given hormone replacement therapy (HRT) to treat symptoms of menopause. Follow these instructions at home: Lifestyle Do not use any products that contain nicotine or tobacco, such as cigarettes, e-cigarettes, and chewing tobacco. If you need help quitting, ask your health care provider. Do not use street drugs. Do not share needles. Ask your health care provider for help if you need support or information about quitting drugs. Alcohol use Do not drink alcohol if: Your health care provider tells you not to drink. You are pregnant, may be pregnant, or are planning to become pregnant. If you drink alcohol: Limit how much you use to 0-1 drink a day. Limit intake if you are breastfeeding. Be aware of how much alcohol is in your drink. In the [place] one drink equals one 12 oz bottle of beer (355 mL), one 5 oz glass of wine (148 mL), or one 1 oz glass of hard liquor (44 mL). General instructions Schedule regular health, dental, and eye exams. Stay current with your vaccines. Tell your health care provider if: You often feel depressed. You have ever been abused or do not feel safe at home. Summary Adopting a healthy lifestyle and getting preventive care are important in promoting health and wellness. Follow your health care provider's instructions about healthy diet, exercising, and getting tested or screened for diseases. Follow your health care provider's  instructions on monitoring your cholesterol and blood pressure. This information is not intended to replace advice given to you by your health care provider. Make sure you discuss any questions you have with your healthcare provider. Document Revised: 10/12/2018 Document Reviewed: 10/12/2018 Elsevier Patient Education  2022 Elsevier Inc.  

## 2021-04-24 LAB — COMPREHENSIVE METABOLIC PANEL
ALT: 15 U/L (ref 0–35)
AST: 19 U/L (ref 0–37)
Albumin: 4.3 g/dL (ref 3.5–5.2)
Alkaline Phosphatase: 72 U/L (ref 39–117)
BUN: 12 mg/dL (ref 6–23)
CO2: 31 mEq/L (ref 19–32)
Calcium: 9.6 mg/dL (ref 8.4–10.5)
Chloride: 102 mEq/L (ref 96–112)
Creatinine, Ser: 0.68 mg/dL (ref 0.40–1.20)
GFR: 89.47 mL/min (ref >60.00)
Glucose, Bld: 95 mg/dL (ref 70–99)
Potassium: 4 mEq/L (ref 3.5–5.1)
Sodium: 141 mEq/L (ref 135–145)
Total Bilirubin: 0.3 mg/dL (ref 0.2–1.2)
Total Protein: 7.8 g/dL (ref 6.0–8.3)

## 2021-04-24 LAB — CBC WITH DIFFERENTIAL/PLATELET
Basophils Absolute: 0.1 10*3/uL (ref 0.0–0.1)
Basophils Relative: 1.3 % (ref 0.0–3.0)
Eosinophils Absolute: 0.3 10*3/uL (ref 0.0–0.7)
Eosinophils Relative: 4.9 % (ref 0.0–5.0)
HCT: 40.6 % (ref 36.0–46.0)
Hemoglobin: 13.4 g/dL (ref 12.0–15.0)
Lymphocytes Relative: 46.1 % — ABNORMAL HIGH (ref 12.0–46.0)
Lymphs Abs: 3.3 10*3/uL (ref 0.7–4.0)
MCHC: 33 g/dL (ref 30.0–36.0)
MCV: 83.9 fl (ref 78.0–100.0)
Monocytes Absolute: 0.6 10*3/uL (ref 0.1–1.0)
Monocytes Relative: 8.8 % (ref 3.0–12.0)
Neutro Abs: 2.8 10*3/uL (ref 1.4–7.7)
Neutrophils Relative %: 38.9 % — ABNORMAL LOW (ref 43.0–77.0)
Platelets: 330 10*3/uL (ref 150.0–400.0)
RBC: 4.84 Mil/uL (ref 3.87–5.11)
RDW: 14.2 % (ref 11.5–15.5)
WBC: 7.1 10*3/uL (ref 4.0–10.5)

## 2021-04-24 LAB — LIPID PANEL
Cholesterol: 186 mg/dL (ref 0–200)
HDL: 52.1 mg/dL (ref >39.00)
LDL Cholesterol: 115 mg/dL — ABNORMAL HIGH (ref 0–99)
NonHDL: 134.04
Total CHOL/HDL Ratio: 4
Triglycerides: 96 mg/dL (ref 0.0–149.0)
VLDL: 19.2 mg/dL (ref 0.0–40.0)

## 2021-04-24 LAB — TSH: TSH: 1.2 u[IU]/mL (ref 0.35–4.50)

## 2021-04-30 ENCOUNTER — Ambulatory Visit: Payer: No Typology Code available for payment source | Admitting: Emergency Medicine

## 2021-04-30 ENCOUNTER — Telehealth: Payer: Self-pay | Admitting: Emergency Medicine

## 2021-04-30 NOTE — Telephone Encounter (Signed)
Immigration--Note not able to go to school to learn English because of her condition  For her job: Need a MD note that she can't stand up for a period of time, when she stands she falls over  Mail to [place] [id number]--Please call before mailing to let them know it is coming   [phone]

## 2021-05-13 NOTE — Telephone Encounter (Signed)
Her condition does not prevent her from going to school to learn [place]. Not enough information regarding standing restrictions.  We need more specifics please.

## 2021-05-15 NOTE — Telephone Encounter (Signed)
Called pt to get more information on what pt needs. No answer, left a VM to call back.

## 2021-05-15 NOTE — Telephone Encounter (Signed)
Called pt son for clarity, he states that the pt is applying for citizenship and is unable to read well and dose not speak [place]. He states that in order to get citizenship, she needs documentation stating that due to age and lack of education that learning English now would be difficult to understanding and would take an extended amount of time to learn. He states that immigration requires documentation in order for that part of the process to be waived.  In addition, pt son states that pt experiences swelling in her legs often and it is difficult for pt to stand for long periods of time. Pt requesting a letter for work stating it is ok for pt to sit at work.

## 2021-05-16 NOTE — Telephone Encounter (Signed)
Called pt son and left VM that letters are available for pick up.

## 2021-05-16 NOTE — Telephone Encounter (Signed)
Reasonable.  Two different letters created.  Ready for printing when needed.  Thanks.

## 2021-05-19 ENCOUNTER — Other Ambulatory Visit: Payer: No Typology Code available for payment source

## 2021-05-19 ENCOUNTER — Telehealth: Payer: Self-pay

## 2021-05-19 ENCOUNTER — Ambulatory Visit
Admission: RE | Admit: 2021-05-19 | Discharge: 2021-05-19 | Disposition: A | Discharge status: Home / Self Care | Payer: No Typology Code available for payment source | Source: Ambulatory Visit | Attending: Emergency Medicine | Admitting: Emergency Medicine

## 2021-05-19 DIAGNOSIS — Z87448 Personal history of other diseases of urinary system: Secondary | ICD-10-CM

## 2021-05-20 ENCOUNTER — Ambulatory Visit (INDEPENDENT_AMBULATORY_CARE_PROVIDER_SITE_OTHER): Payer: No Typology Code available for payment source | Admitting: Emergency Medicine

## 2021-05-20 ENCOUNTER — Encounter: Payer: Self-pay | Admitting: Emergency Medicine

## 2021-05-20 ENCOUNTER — Other Ambulatory Visit: Payer: Self-pay

## 2021-05-20 VITALS — BP 118/68 | HR 70 | Temp 98.1°F | Ht 68.0 in | Wt 159.0 lb

## 2021-05-20 DIAGNOSIS — R202 Paresthesia of skin: Secondary | ICD-10-CM | POA: Diagnosis not present

## 2021-05-20 DIAGNOSIS — M79604 Pain in right leg: Secondary | ICD-10-CM | POA: Insufficient documentation

## 2021-05-20 DIAGNOSIS — E1169 Type 2 diabetes mellitus with other specified complication: Secondary | ICD-10-CM

## 2021-05-20 DIAGNOSIS — E1159 Type 2 diabetes mellitus with other circulatory complications: Secondary | ICD-10-CM

## 2021-05-20 DIAGNOSIS — M79605 Pain in left leg: Secondary | ICD-10-CM | POA: Diagnosis not present

## 2021-05-20 DIAGNOSIS — E785 Hyperlipidemia, unspecified: Secondary | ICD-10-CM

## 2021-05-20 DIAGNOSIS — I152 Hypertension secondary to endocrine disorders: Secondary | ICD-10-CM

## 2021-05-20 LAB — SEDIMENTATION RATE: Sed Rate: 23 mm/hr (ref 0–30)

## 2021-05-20 MED ORDER — GABAPENTIN 300 MG PO CAPS: 300.0000 mg | ORAL_CAPSULE | Freq: Every day | ORAL | 1 refills | Status: DC

## 2021-05-20 NOTE — Assessment & Plan Note (Signed)
Will rule out statin myopathy with CPK test

## 2021-05-20 NOTE — Assessment & Plan Note (Signed)
Could be a sign of diabetic neuropathy.  We will try gabapentin 300 mg at bedtime.

## 2021-05-20 NOTE — Telephone Encounter (Signed)
Called and spoke with pt son, pt has OV 05/20/21 and will pick up another copy of the letter.

## 2021-05-20 NOTE — Progress Notes (Signed)
Cheryl Hopkins 70 y.o.   Chief Complaint  Patient presents with   Leg Pain    Both legs, numb at times hard to walk    HISTORY OF PRESENT ILLNESS: This is a 70 y.o. female with history of diabetes, hypertension, dyslipidemia, and bronchiectasis complaining of bilateral leg pain and numbness for the past several weeks.  Also feels some numbness and tingling.  Son interpreting for mother. No injuries.  No other significant associated symptoms. Renal ultrasound done yesterday showed the following: US Renal  Result Date: 05/20/2021 CLINICAL DATA:  Left renal cyst EXAM: RENAL / URINARY TRACT ULTRASOUND COMPLETE COMPARISON:  None. FINDINGS: Right Kidney: Renal measurements: 11.7 x 4.5 x 5.6 cm = volume: 154 mL. Echogenicity within normal limits. No mass or hydronephrosis visualized. Left Kidney: Renal measurements: 13.0 x 5.3 x 5.3 cm = volume: 191 mL. Echogenicity within normal limits. No mass or hydronephrosis visualized. 3.0 x 2.4 x 2.7 cm simple cyst present at the lower pole of the left kidney. Bladder: Appears normal for degree of bladder distention. Other: None. IMPRESSION: 3.0 x 2.4 x 2.7 cm simple cyst present at the lower pole the left kidney. Electronically Signed   By: Miachel Roux M.D.   On: 05/20/2021 08:28      Prior to Admission medications   Medication Sig Start Date End Date Taking? Authorizing Provider  ACCU-CHEK GUIDE test strip USE 1 STRIP ONCE DAILY 06/05/20  Yes Horald Pollen, MD  Accu-Chek Softclix Lancets lancets SMARTSIG:1 Topical Daily 04/23/21  Yes Gorden Stthomas, Ines Bloomer, MD  amLODipine (NORVASC) 10 MG tablet Take 1 tablet (10 mg total) by mouth daily. 04/23/21 10/20/21 Yes Ellison Rieth, Ines Bloomer, MD  Blood Glucose Monitoring Suppl w/Device KIT 1 Device by Does not apply route daily. 05/08/20  Yes Sherline Eberwein, Ines Bloomer, MD  glimepiride (AMARYL) 2 MG tablet Take 1 tablet (2 mg total) by mouth daily with breakfast. TAKE ONE TABLET BY MOUTH WITH BREAKFAST 04/23/21 10/20/21  Yes Tiasha Helvie, Ines Bloomer, MD  metFORMIN (GLUCOPHAGE-XR) 500 MG 24 hr tablet Take 1 tablet (500 mg total) by mouth 2 (two) times daily with a meal. 04/23/21 10/20/21 Yes Wadie Liew, Ines Bloomer, MD  rosuvastatin (CRESTOR) 20 MG tablet Take 1 tablet (20 mg total) by mouth daily. 04/23/21 10/20/21 Yes Tyannah Sane, Ines Bloomer, MD  sitaGLIPtin (JANUVIA) 50 MG tablet Take 1 tablet (50 mg total) by mouth daily. 04/23/21 10/20/21 Yes Merle Cirelli, Ines Bloomer, MD  Spacer/Aero-Holding Chambers (AEROCHAMBER PLUS) inhaler Use as instructed 04/23/21  Yes Amiri Riechers, Ines Bloomer, MD  Tiotropium Bromide Monohydrate (SPIRIVA RESPIMAT) 2.5 MCG/ACT AERS Inhale 2 puffs into the lungs daily. 06/19/20  Yes Brand Males, MD    Not on File  Patient Active Problem List   Diagnosis Date Noted   History of simple renal cyst 04/23/2021   Bronchiectasis without complication ([place]) 63/84/6659   Language barrier 08/14/2020   Healthcare maintenance 08/14/2020   Solitary pulmonary nodule 03/05/2020   Chronic pain of both knees 03/29/2019   Chronic lung disease 10/31/2015   Hypertension associated with diabetes ([place]) 10/31/2015   Dyslipidemia associated with type 2 diabetes mellitus ([place]) 09/30/2015   Hyperlipidemia 09/30/2015    Past Medical History:  Diagnosis Date   Arthritis    knee   Bronchitis    Diabetes mellitus without complication ([place])    Several years.   Hyperlipidemia 09/30/2015   Hypertension    Stomach pain    with diarrhea    Past Surgical History:  Procedure Laterality Date   COLONOSCOPY  Social History   Socioeconomic History   Marital status: Widowed    Spouse name: Widowed since 2010   Number of children: 9   Years of education: <8   Highest education level: Not on file  Occupational History   Occupation: Housewife  Tobacco Use   Smoking status: Never   Smokeless tobacco: Never   Tobacco comments:    Chews cola nut  Substance and Sexual Activity   Alcohol use: No     Alcohol/week: 0.0 standard drinks   Drug use: No   Sexual activity: Not Currently  Other Topics Concern   Not on file  Social History Narrative      Widow   Originally from [place] - Came to [place]. In 2016   Lives with son and his wife and their 3 children.        Exercise - no exercise      Seatbelt - 100%   Gun in home - no   Social Determinants of Health   Financial Resource Strain: Not on file  Food Insecurity: Not on file  Transportation Needs: Not on file  Physical Activity: Not on file  Stress: Not on file  Social Connections: Not on file  Intimate Partner Violence: Not on file    Family History  Problem Relation Age of Onset   Bronchitis Sister    Hepatitis Daughter    Hypertension Sister    Stroke Sister    Colon cancer Neg Hx      Review of Systems  Constitutional: Negative.  Negative for chills and fever.  HENT: Negative.  Negative for congestion and sore throat.   Respiratory: Negative.  Negative for hemoptysis and shortness of breath.   Cardiovascular: Negative.  Negative for chest pain and palpitations.  Gastrointestinal:  Negative for abdominal pain, diarrhea, nausea and vomiting.  Genitourinary: Negative.   Skin: Negative.  Negative for rash.  Neurological:  Negative for dizziness and headaches.  All other systems reviewed and are negative.  Today's Vitals   05/20/21 0836  BP: 118/68  Pulse: 70  Temp: 98.1 F (36.7 C)  TempSrc: Oral  SpO2: 96%  Weight: 159 lb (72.1 kg)  Height: _0  (1.727 m)   Body mass index is 24.18 kg/m.  Physical Exam Vitals reviewed.  Constitutional:      Appearance: Normal appearance.  HENT:     Head: Normocephalic.  Eyes:     Extraocular Movements: Extraocular movements intact.     Pupils: Pupils are equal, round, and reactive to light.  Cardiovascular:     Rate and Rhythm: Normal rate and regular rhythm.     Pulses: Normal pulses.     Heart sounds: Normal heart sounds.  Pulmonary:     Effort: Pulmonary  effort is normal.     Breath sounds: Normal breath sounds.  Musculoskeletal:     Cervical back: Normal range of motion.     Comments: Lower extremities: Mild tenderness to palpation of thigh muscles.  No crepitation.  No erythema.  No ecchymosis. Feet: Warm to touch.  No erythema or ecchymosis.  Good distal circulation.  Excellent capillary refill.  Good sensation.  Skin:    General: Skin is warm and dry.     Capillary Refill: Capillary refill takes less than 2 seconds.  Neurological:     General: No focal deficit present.     Mental Status: She is alert and oriented to person, place, and time.     Deep Tendon Reflexes: Babinski sign absent  on the right side. Babinski sign absent on the left side.     Reflex Scores:      Patellar reflexes are 1+ on the right side and 1+ on the left side.      Achilles reflexes are 1+ on the right side and 1+ on the left side. Psychiatric:        Mood and Affect: Mood normal.        Behavior: Behavior normal.     ASSESSMENT & PLAN: A total of 30 minutes was spent with the patient and counseling/coordination of care regarding preparing for this visit, review of most recent office visit notes, review of most recent blood work results, differential diagnosis of leg pain and paresthesias, need for additional blood work and diagnostic work-up, review of all medications, review of new medication called gabapentin, education on diabetic nutrition, review of renal ultrasound done yesterday, prognosis, documentation and need for follow-up.  Hypertension associated with diabetes ([place]) Well-controlled hypertension.  Continue amlodipine 10 mg daily  Bilateral leg pain Will rule out statin myopathy with CPK test  Paresthesia of both lower extremities Could be a sign of diabetic neuropathy.  We will try gabapentin 300 mg at bedtime.  Barri was seen today for leg pain.  Diagnoses and all orders for this visit:  Bilateral leg pain -     Cancel: CK -      Sedimentation rate -     CK  Paresthesia of both lower extremities -     gabapentin (NEURONTIN) 300 MG capsule; Take 1 capsule (300 mg total) by mouth at bedtime.  Hypertension associated with diabetes ([place]) -     Microalbumin, urine  Dyslipidemia associated with type 2 diabetes mellitus ([place])  Patient Instructions  Diabetes Mellitus and Nutrition, Adult When you have diabetes, or diabetes mellitus, it is very important to have healthy eating habits because your blood sugar (glucose) levels are greatly affected by what you eat and drink. Eating healthy foods in the right amounts, at about the same times every day, can help you: Control your blood glucose. Lower your risk of heart disease. Improve your blood pressure. Reach or maintain a healthy weight. What can affect my meal plan? Every person with diabetes is different, and each person has different needs for a meal plan. Your health care provider may recommend that you work with a dietitian to make a meal plan that is best for you. Your meal plan may vary depending on factors such as: The calories you need. The medicines you take. Your weight. Your blood glucose, blood pressure, and cholesterol levels. Your activity level. Other health conditions you have, such as heart or kidney disease. How do carbohydrates affect me? Carbohydrates, also called carbs, affect your blood glucose level more than any other type of food. Eating carbs naturally raises the amount of glucose in your blood. Carb counting is a method for keeping track of how many carbs you eat. Counting carbs is important to keep your blood glucose at a healthy level,especially if you use insulin or take certain oral diabetes medicines. It is important to know how many carbs you can safely have in each meal. This is different for every person. Your dietitian can help you calculate how manycarbs you should have at each meal and for each snack. How does alcohol affect  me? Alcohol can cause a sudden decrease in blood glucose (hypoglycemia), especially if you use insulin or take certain oral diabetes medicines. Hypoglycemia can be a life-threatening condition.  Symptoms of hypoglycemia, such as sleepiness, dizziness, and confusion, are similar to symptoms of having too much alcohol. Do not drink alcohol if: Your health care provider tells you not to drink. You are pregnant, may be pregnant, or are planning to become pregnant. If you drink alcohol: Do not drink on an empty stomach. Limit how much you use to: 0-1 drink a day for women. 0-2 drinks a day for men. Be aware of how much alcohol is in your drink. In the [place] one drink equals one 12 oz bottle of beer (355 mL), one 5 oz glass of wine (148 mL), or one 1 oz glass of hard liquor (44 mL). Keep yourself hydrated with water, diet soda, or unsweetened iced tea. Keep in mind that regular soda, juice, and other mixers may contain a lot of sugar and must be counted as carbs. What are tips for following this plan?  Reading food labels Start by checking the serving size on the "Nutrition Facts" label of packaged foods and drinks. The amount of calories, carbs, fats, and other nutrients listed on the label is based on one serving of the item. Many items contain more than one serving per package. Check the total grams (g) of carbs in one serving. You can calculate the number of servings of carbs in one serving by dividing the total carbs by 15. For example, if a food has 30 g of total carbs per serving, it would be equal to 2 servings of carbs. Check the number of grams (g) of saturated fats and trans fats in one serving. Choose foods that have a low amount or none of these fats. Check the number of milligrams (mg) of salt (sodium) in one serving. Most people should limit total sodium intake to less than 2,300 mg per day. Always check the nutrition information of foods labeled as "low-fat" or "nonfat." These foods may  be higher in added sugar or refined carbs and should be avoided. Talk to your dietitian to identify your daily goals for nutrients listed on the label. Shopping Avoid buying canned, pre-made, or processed foods. These foods tend to be high in fat, sodium, and added sugar. Shop around the outside edge of the grocery store. This is where you will most often find fresh fruits and vegetables, bulk grains, fresh meats, and fresh dairy. Cooking Use low-heat cooking methods, such as baking, instead of high-heat cooking methods like deep frying. Cook using healthy oils, such as olive, canola, or sunflower oil. Avoid cooking with butter, cream, or high-fat meats. Meal planning Eat meals and snacks regularly, preferably at the same times every day. Avoid going long periods of time without eating. Eat foods that are high in fiber, such as fresh fruits, vegetables, beans, and whole grains. Talk with your dietitian about how many servings of carbs you can eat at each meal. Eat 4-6 oz (112-168 g) of lean protein each day, such as lean meat, chicken, fish, eggs, or tofu. One ounce (oz) of lean protein is equal to: 1 oz (28 g) of meat, chicken, or fish. 1 egg.  cup (62 g) of tofu. Eat some foods each day that contain healthy fats, such as avocado, nuts, seeds, and fish. What foods should I eat? Fruits Berries. Apples. Oranges. Peaches. Apricots. Plums. Grapes. Mango. Papaya.Pomegranate. Kiwi. Cherries. Vegetables Lettuce. Spinach. Leafy greens, including kale, chard, collard greens, and mustard greens. Beets. Cauliflower. Cabbage. Broccoli. Carrots. Green beans.Tomatoes. Peppers. Onions. Cucumbers. Brussels sprouts. Grains Whole grains, such as whole-wheat or whole-grain  bread, crackers, tortillas,cereal, and pasta. Unsweetened oatmeal. Quinoa. Brown or wild rice. Meats and other proteins Seafood. Poultry without skin. Lean cuts of poultry and beef. Tofu. Nuts. Seeds. Dairy Low-fat or fat-free dairy  products such as milk, yogurt, and cheese. The items listed above may not be a complete list of foods and beverages you can eat. Contact a dietitian for more information. What foods should I avoid? Fruits Fruits canned with syrup. Vegetables Canned vegetables. Frozen vegetables with butter or cream sauce. Grains Refined white flour and flour products such as bread, pasta, snack foods, andcereals. Avoid all processed foods. Meats and other proteins Fatty cuts of meat. Poultry with skin. Breaded or fried meats. Processed meat.Avoid saturated fats. Dairy Full-fat yogurt, cheese, or milk. Beverages Sweetened drinks, such as soda or iced tea. The items listed above may not be a complete list of foods and beverages you should avoid. Contact a dietitian for more information. Questions to ask a health care provider Do I need to meet with a diabetes educator? Do I need to meet with a dietitian? What number can I call if I have questions? When are the best times to check my blood glucose? Where to find more information: American Diabetes Association: diabetes.org Academy of Nutrition and Dietetics: www.eatright.[employer] of Diabetes and Digestive and Kidney Diseases: [employer] Association of Diabetes Care and Education Specialists: www.diabeteseducator.org Summary It is important to have healthy eating habits because your blood sugar (glucose) levels are greatly affected by what you eat and drink. A healthy meal plan will help you control your blood glucose and maintain a healthy lifestyle. Your health care provider may recommend that you work with a dietitian to make a meal plan that is best for you. Keep in mind that carbohydrates (carbs) and alcohol have immediate effects on your blood glucose levels. It is important to count carbs and to use alcohol carefully. This information is not intended to replace advice given to you by your health care provider. Make sure you  discuss any questions you have with your healthcare provider. Document Revised: 09/26/2019 Document Reviewed: 09/26/2019 Elsevier Patient Education  2021 [place], [place] [facility]

## 2021-05-20 NOTE — Patient Instructions (Signed)
Diabetes Mellitus and Nutrition, Adult When you have diabetes, or diabetes mellitus, it is very important to have healthy eating habits because your blood sugar (glucose) levels are greatly affected by what you eat and drink. Eating healthy foods in the right amounts, at about the same times every day, can help you:  Control your blood glucose.  Lower your risk of heart disease.  Improve your blood pressure.  Reach or maintain a healthy weight. What can affect my meal plan? Every person with diabetes is different, and each person has different needs for a meal plan. Your health care provider may recommend that you work with a dietitian to make a meal plan that is best for you. Your meal plan may vary depending on factors such as:  The calories you need.  The medicines you take.  Your weight.  Your blood glucose, blood pressure, and cholesterol levels.  Your activity level.  Other health conditions you have, such as heart or kidney disease. How do carbohydrates affect me? Carbohydrates, also called carbs, affect your blood glucose level more than any other type of food. Eating carbs naturally raises the amount of glucose in your blood. Carb counting is a method for keeping track of how many carbs you eat. Counting carbs is important to keep your blood glucose at a healthy level, especially if you use insulin or take certain oral diabetes medicines. It is important to know how many carbs you can safely have in each meal. This is different for every person. Your dietitian can help you calculate how many carbs you should have at each meal and for each snack. How does alcohol affect me? Alcohol can cause a sudden decrease in blood glucose (hypoglycemia), especially if you use insulin or take certain oral diabetes medicines. Hypoglycemia can be a life-threatening condition. Symptoms of hypoglycemia, such as sleepiness, dizziness, and confusion, are similar to symptoms of having too much  alcohol.  Do not drink alcohol if: ? Your health care provider tells you not to drink. ? You are pregnant, may be pregnant, or are planning to become pregnant.  If you drink alcohol: ? Do not drink on an empty stomach. ? Limit how much you use to:  0-1 drink a day for women.  0-2 drinks a day for men. ? Be aware of how much alcohol is in your drink. In the [place] one drink equals one 12 oz bottle of beer (355 mL), one 5 oz glass of wine (148 mL), or one 1 oz glass of hard liquor (44 mL). ? Keep yourself hydrated with water, diet soda, or unsweetened iced tea.  Keep in mind that regular soda, juice, and other mixers may contain a lot of sugar and must be counted as carbs. What are tips for following this plan? Reading food labels  Start by checking the serving size on the "Nutrition Facts" label of packaged foods and drinks. The amount of calories, carbs, fats, and other nutrients listed on the label is based on one serving of the item. Many items contain more than one serving per package.  Check the total grams (g) of carbs in one serving. You can calculate the number of servings of carbs in one serving by dividing the total carbs by 15. For example, if a food has 30 g of total carbs per serving, it would be equal to 2 servings of carbs.  Check the number of grams (g) of saturated fats and trans fats in one serving. Choose foods that have   a low amount or none of these fats.  Check the number of milligrams (mg) of salt (sodium) in one serving. Most people should limit total sodium intake to less than 2,300 mg per day.  Always check the nutrition information of foods labeled as "low-fat" or "nonfat." These foods may be higher in added sugar or refined carbs and should be avoided.  Talk to your dietitian to identify your daily goals for nutrients listed on the label. Shopping  Avoid buying canned, pre-made, or processed foods. These foods tend to be high in fat, sodium, and added  sugar.  Shop around the outside edge of the grocery store. This is where you will most often find fresh fruits and vegetables, bulk grains, fresh meats, and fresh dairy. Cooking  Use low-heat cooking methods, such as baking, instead of high-heat cooking methods like deep frying.  Cook using healthy oils, such as olive, canola, or sunflower oil.  Avoid cooking with butter, cream, or high-fat meats. Meal planning  Eat meals and snacks regularly, preferably at the same times every day. Avoid going long periods of time without eating.  Eat foods that are high in fiber, such as fresh fruits, vegetables, beans, and whole grains. Talk with your dietitian about how many servings of carbs you can eat at each meal.  Eat 4-6 oz (112-168 g) of lean protein each day, such as lean meat, chicken, fish, eggs, or tofu. One ounce (oz) of lean protein is equal to: ? 1 oz (28 g) of meat, chicken, or fish. ? 1 egg. ?  cup (62 g) of tofu.  Eat some foods each day that contain healthy fats, such as avocado, nuts, seeds, and fish.   What foods should I eat? Fruits Berries. Apples. Oranges. Peaches. Apricots. Plums. Grapes. Mango. Papaya. Pomegranate. Kiwi. Cherries. Vegetables Lettuce. Spinach. Leafy greens, including kale, chard, collard greens, and mustard greens. Beets. Cauliflower. Cabbage. Broccoli. Carrots. Green beans. Tomatoes. Peppers. Onions. Cucumbers. Brussels sprouts. Grains Whole grains, such as whole-wheat or whole-grain bread, crackers, tortillas, cereal, and pasta. Unsweetened oatmeal. Quinoa. Brown or wild rice. Meats and other proteins Seafood. Poultry without skin. Lean cuts of poultry and beef. Tofu. Nuts. Seeds. Dairy Low-fat or fat-free dairy products such as milk, yogurt, and cheese. The items listed above may not be a complete list of foods and beverages you can eat. Contact a dietitian for more information. What foods should I avoid? Fruits Fruits canned with  syrup. Vegetables Canned vegetables. Frozen vegetables with butter or cream sauce. Grains Refined white flour and flour products such as bread, pasta, snack foods, and cereals. Avoid all processed foods. Meats and other proteins Fatty cuts of meat. Poultry with skin. Breaded or fried meats. Processed meat. Avoid saturated fats. Dairy Full-fat yogurt, cheese, or milk. Beverages Sweetened drinks, such as soda or iced tea. The items listed above may not be a complete list of foods and beverages you should avoid. Contact a dietitian for more information. Questions to ask a health care provider  Do I need to meet with a diabetes educator?  Do I need to meet with a dietitian?  What number can I call if I have questions?  When are the best times to check my blood glucose? Where to find more information:  American Diabetes Association: diabetes.org  Academy of Nutrition and Dietetics: www.eatright.org  National Institute of Diabetes and Digestive and Kidney Diseases: www.niddk.nih.gov  Association of Diabetes Care and Education Specialists: www.diabeteseducator.org Summary  It is important to have healthy eating   habits because your blood sugar (glucose) levels are greatly affected by what you eat and drink.  A healthy meal plan will help you control your blood glucose and maintain a healthy lifestyle.  Your health care provider may recommend that you work with a dietitian to make a meal plan that is best for you.  Keep in mind that carbohydrates (carbs) and alcohol have immediate effects on your blood glucose levels. It is important to count carbs and to use alcohol carefully. This information is not intended to replace advice given to you by your health care provider. Make sure you discuss any questions you have with your health care provider. Document Revised: 09/26/2019 Document Reviewed: 09/26/2019 Elsevier Patient Education  2021 Elsevier Inc.  

## 2021-05-20 NOTE — Assessment & Plan Note (Signed)
Well-controlled hypertension.  Continue amlodipine 10 mg daily

## 2021-05-21 LAB — MICROALBUMIN, URINE: Microalb, Ur: 0.6 mg/dL

## 2021-05-21 LAB — CK: Total CK: 49 U/L (ref 32–182)

## 2021-06-18 ENCOUNTER — Telehealth: Payer: Self-pay | Admitting: Emergency Medicine

## 2021-06-18 DIAGNOSIS — Z1211 Encounter for screening for malignant neoplasm of colon: Secondary | ICD-10-CM

## 2021-06-18 NOTE — Telephone Encounter (Signed)
Pt son called and stated that pt needed a referral to get colonoscopy. Referral placed has been ordered.

## 2021-06-18 NOTE — Telephone Encounter (Signed)
Thank you :)

## 2021-06-18 NOTE — Telephone Encounter (Signed)
Patient's son Ilda Mori called   Patient received a letter stating she due for a colonoscopy this year. Ilda Mori was unsure of where she had her last colonoscopy so they are requesting a new referral to be put in.   Please advise.

## 2021-06-23 ENCOUNTER — Other Ambulatory Visit: Payer: Self-pay

## 2021-06-23 ENCOUNTER — Ambulatory Visit
Admission: RE | Admit: 2021-06-23 | Discharge: 2021-06-23 | Disposition: A | Discharge status: Home / Self Care | Payer: Medicaid Other | Source: Ambulatory Visit | Attending: Emergency Medicine | Admitting: Emergency Medicine

## 2021-06-23 DIAGNOSIS — Z1231 Encounter for screening mammogram for malignant neoplasm of breast: Secondary | ICD-10-CM

## 2021-07-16 ENCOUNTER — Encounter: Payer: Self-pay | Admitting: Gastroenterology

## 2021-08-07 ENCOUNTER — Encounter: Payer: Medicaid Other | Admitting: Gastroenterology

## 2021-08-13 ENCOUNTER — Other Ambulatory Visit: Payer: Self-pay

## 2021-08-13 ENCOUNTER — Ambulatory Visit (AMBULATORY_SURGERY_CENTER): Payer: Medicaid Other

## 2021-08-13 VITALS — Ht 68.0 in | Wt 159.0 lb

## 2021-08-13 DIAGNOSIS — Z86010 Personal history of colonic polyps: Secondary | ICD-10-CM

## 2021-08-13 MED ORDER — NA SULFATE-K SULFATE-MG SULF 17.5-3.13-1.6 GM/177ML PO SOLN: 1.0000 | ORAL | 0 refills | Status: DC

## 2021-08-13 NOTE — Progress Notes (Signed)
Patient's pre-visit was done today over the phone with the patient due to COVID-19 pandemic. Name,DOB and address verified. Patient denies any allergies to Eggs and Soy. Patient denies any problems with anesthesia/sedation. Patient is not taking any diet pills or blood thinners. No home Oxygen. Packet of Prep instructions mailed to patient including a copy of a consent form-pt is aware. Patient understands to call us back with any questions or concerns. Patient is aware of our care-partner policy and ZTAEW-25 safety protocol.   Pt does not speak english.  I did the pre-visit over the phone with her son, Cheryl Hopkins.  He speaks english.  The patient is COVID-19 vaccinated.

## 2021-08-29 ENCOUNTER — Encounter: Payer: Self-pay | Admitting: Gastroenterology

## 2021-08-29 ENCOUNTER — Ambulatory Visit (AMBULATORY_SURGERY_CENTER): Payer: Medicaid Other | Admitting: Gastroenterology

## 2021-08-29 VITALS — BP 120/60 | HR 70 | Temp 96.2°F | Resp 14 | Ht 68.0 in | Wt 159.0 lb

## 2021-08-29 DIAGNOSIS — D122 Benign neoplasm of ascending colon: Secondary | ICD-10-CM

## 2021-08-29 DIAGNOSIS — D123 Benign neoplasm of transverse colon: Secondary | ICD-10-CM

## 2021-08-29 DIAGNOSIS — Z86010 Personal history of colonic polyps: Secondary | ICD-10-CM | POA: Diagnosis not present

## 2021-08-29 DIAGNOSIS — K621 Rectal polyp: Secondary | ICD-10-CM | POA: Diagnosis not present

## 2021-08-29 DIAGNOSIS — D128 Benign neoplasm of rectum: Secondary | ICD-10-CM

## 2021-08-29 MED ORDER — SODIUM CHLORIDE 0.9 % IV SOLN: 500.0000 mL | Freq: Once | INTRAVENOUS | Status: DC

## 2021-08-29 NOTE — Progress Notes (Signed)
Pt's states no medical or surgical changes since previsit or office visit. VS assessed by D.T 

## 2021-08-29 NOTE — Progress Notes (Signed)
A and O x3. Report to RN. Tolerated MAC anesthesia well. 

## 2021-08-29 NOTE — Progress Notes (Signed)
Interpreter used today at the [facility] for this pt.  Interpreter's name is- Issoufou, Ronnell Freshwater (patient's son).

## 2021-08-29 NOTE — Progress Notes (Signed)
Called to room to assist during endoscopic procedure.  Patient ID and intended procedure confirmed with present staff. Received instructions for my participation in the procedure from the performing physician.  

## 2021-08-29 NOTE — Patient Instructions (Signed)
YOU HAD AN ENDOSCOPIC PROCEDURE TODAY AT [facility]:   Refer to the procedure report that was given to you for any specific questions about what was found during the examination.  If the procedure report does not answer your questions, please call your gastroenterologist to clarify.  If you requested that your care partner not be given the details of your procedure findings, then the procedure report has been included in a sealed envelope for you to review at your convenience later.  YOU SHOULD EXPECT: Some feelings of bloating in the abdomen. Passage of more gas than usual.  Walking can help get rid of the air that was put into your GI tract during the procedure and reduce the bloating. If you had a lower endoscopy (such as a colonoscopy or flexible sigmoidoscopy) you may notice spotting of blood in your stool or on the toilet paper. If you underwent a bowel prep for your procedure, you may not have a normal bowel movement for a few days.  Please Note:  You might notice some irritation and congestion in your nose or some drainage.  This is from the oxygen used during your procedure.  There is no need for concern and it should clear up in a day or so.  SYMPTOMS TO REPORT IMMEDIATELY:  Following lower endoscopy (colonoscopy or flexible sigmoidoscopy):  Excessive amounts of blood in the stool  Significant tenderness or worsening of abdominal pains  Swelling of the abdomen that is new, acute  Fever of 100F or higher   For urgent or emergent issues, a gastroenterologist can be reached at any hour by calling [phone]. Do not use MyChart messaging for urgent concerns.    DIET:  We do recommend a small meal at first, but then you may proceed to your regular diet.  Drink plenty of fluids but you should avoid alcoholic beverages for 24 hours.  MEDICATIONS: Continue present medications.  Please see handouts given to you by your recover nurse.  Thank you for allowing [place] to  provide for your healthcare needs today.  ACTIVITY:  You should plan to take it easy for the rest of today and you should NOT DRIVE or use heavy machinery until tomorrow (because of the sedation medicines used during the test).    FOLLOW UP: Our staff will call the number listed on your records 48-72 hours following your procedure to check on you and address any questions or concerns that you may have regarding the information given to you following your procedure. If we do not reach you, we will leave a message.  We will attempt to reach you two times.  During this call, we will ask if you have developed any symptoms of COVID 19. If you develop any symptoms (ie: fever, flu-like symptoms, shortness of breath, cough etc.) before then, please call [phone].  If you test positive for Covid 19 in the 2 weeks post procedure, please call and report this information to [place].    If any biopsies were taken you will be contacted by phone or by letter within the next 1-3 weeks.  Please call us at [phone] if you have not heard about the biopsies in 3 weeks.    SIGNATURES/CONFIDENTIALITY: You and/or your care partner have signed paperwork which will be entered into your electronic medical record.  These signatures attest to the fact that that the information above on your After Visit Summary has been reviewed and is understood.  Full responsibility of the confidentiality  of this discharge information lies with you and/or your care-partner.  

## 2021-08-29 NOTE — Progress Notes (Signed)
History and Physical:  This patient presents for endoscopic testing for: Encounter Diagnosis  Name Primary?   Personal history of colonic polyps Yes    TA x 9 in 12/2016 Patient denies chronic abdominal pain, rectal bleeding, constipation or diarrhea. (Son present as Optometrist)  ROS: Patient denies chest pain or cough   Past Medical History: Past Medical History:  Diagnosis Date   Arthritis    knee   Bronchitis    Diabetes mellitus without complication ([place])    Several years.   Hyperlipidemia 09/30/2015   Hypertension    Stomach pain    with diarrhea     Past Surgical History: Past Surgical History:  Procedure Laterality Date   COLONOSCOPY      Allergies: No Known Allergies  Outpatient Meds: Current Outpatient Medications  Medication Sig Dispense Refill   amLODipine (NORVASC) 10 MG tablet Take 1 tablet (10 mg total) by mouth daily. 180 tablet 1   Blood Glucose Monitoring Suppl w/Device KIT 1 Device by Does not apply route daily. 1 kit 0   gabapentin (NEURONTIN) 300 MG capsule Take 1 capsule (300 mg total) by mouth at bedtime. 90 capsule 1   glimepiride (AMARYL) 2 MG tablet Take 1 tablet (2 mg total) by mouth daily with breakfast. TAKE ONE TABLET BY MOUTH WITH BREAKFAST 180 tablet 1   metFORMIN (GLUCOPHAGE-XR) 500 MG 24 hr tablet Take 1 tablet (500 mg total) by mouth 2 (two) times daily with a meal. 360 tablet 1   Multiple Vitamin (MULTIVITAMIN) tablet Take 1 tablet by mouth daily.     rosuvastatin (CRESTOR) 20 MG tablet Take 1 tablet (20 mg total) by mouth daily. 180 tablet 1   sitaGLIPtin (JANUVIA) 50 MG tablet Take 1 tablet (50 mg total) by mouth daily. 180 tablet 1   ACCU-CHEK GUIDE test strip USE 1 STRIP ONCE DAILY (Patient not taking: Reported on 08/29/2021) 100 each 7   Accu-Chek Softclix Lancets lancets SMARTSIG:1 Topical Daily (Patient not taking: Reported on 08/29/2021) 100 each 7   Spacer/Aero-Holding Chambers (AEROCHAMBER PLUS) inhaler Use as instructed  (Patient not taking: No sig reported) 1 each 2   Tiotropium Bromide Monohydrate (SPIRIVA RESPIMAT) 2.5 MCG/ACT AERS Inhale 2 puffs into the lungs daily. (Patient not taking: No sig reported) 4 g 0   Current Facility-Administered Medications  Medication Dose Route Frequency Provider Last Rate Last Admin   0.9 %  sodium chloride infusion  500 mL Intravenous Once Danis, Estill Cotta III, MD          ___________________________________________________________________ Objective   Exam:  BP (!) 145/69   Pulse 77   Temp (!) 96.2 F (35.7 C) (Skin)   Ht $R'5\' 8"'ur$  (1.727 m)   Wt 159 lb (72.1 kg)   LMP 11/02/2010 (Approximate) Comment: postmenopausal  SpO2 93%   BMI 24.18 kg/m   CV: RRR without murmur, S1/S2 Resp: clear to auscultation bilaterally, normal RR and effort noted GI: soft, no tenderness, with active bowel sounds.   Assessment: Encounter Diagnosis  Name Primary?   Personal history of colonic polyps Yes     Plan: Colonoscopy  The benefits and risks of the planned procedure were described in detail with the patient or (when appropriate) their health care proxy.  Risks were outlined as including, but not limited to, bleeding, infection, perforation, adverse medication reaction leading to cardiac or pulmonary decompensation, pancreatitis (if ERCP).  The limitation of incomplete mucosal visualization was also discussed.  No guarantees or warranties were given.    The  patient is appropriate for an endoscopic procedure in the ambulatory setting.   - Wilfrid Lund, MD

## 2021-08-29 NOTE — Op Note (Signed)
[place] Patient Name: Cheryl Hopkins Procedure Date: 08/29/2021 10:36 AM MRN: [id number] Endoscopist: Mallie Mussel L. Loletha Carrow , MD Age: 70 Referring MD:  Date of Birth: 08-Apr-1951 Gender: Female Account #: [id number] Procedure:                Colonoscopy Indications:              Surveillance: Personal history of adenomatous                            polyps on last colonoscopy > 3 years ago                           TA x 9 on first colonoscopy 12/2016 (including a                            polyp > 78mm) Medicines:                Monitored Anesthesia Care Procedure:                Pre-Anesthesia Assessment:                           - Prior to the procedure, a History and Physical                            was performed, and patient medications and                            allergies were reviewed. The patient's tolerance of                            previous anesthesia was also reviewed. The risks                            and benefits of the procedure and the sedation                            options and risks were discussed with the patient.                            All questions were answered, and informed consent                            was obtained. Prior Anticoagulants: The patient has                            taken no previous anticoagulant or antiplatelet                            agents. ASA Grade Assessment: III - A patient with                            severe systemic disease. After reviewing the risks  and benefits, the patient was deemed in                            satisfactory condition to undergo the procedure.                           After obtaining informed consent, the colonoscope                            was passed under direct vision. Throughout the                            procedure, the patient's blood pressure, pulse, and                            oxygen saturations were monitored continuously. The                             CF [id number] #[id number] was introduced through the anus                            and advanced to the the cecum, identified by                            appendiceal orifice and ileocecal valve. The                            colonoscopy was performed without difficulty. The                            patient tolerated the procedure well. The quality                            of the bowel preparation was excellent. The                            ileocecal valve, appendiceal orifice, and rectum                            were photographed. The bowel preparation used was                            SUPREP. Scope In: 10:46:36 AM Scope Out: 11:03:01 AM Scope Withdrawal Time: 0 hours 12 minutes 56 seconds  Total Procedure Duration: 0 hours 16 minutes 25 seconds  Findings:                 The perianal and digital rectal examinations were                            normal.                           Three sessile polyps were found in the transverse  colon and ascending colon. The polyps were 1 to 5                            mm in size. These polyps were removed with a cold                            snare. Resection and retrieval were complete.                           A 3 mm polyp was found in the distal rectum. The                            polyp was sessile. The polyp was removed with a                            cold snare. Resection and retrieval were complete.                           A few diverticula were found in the left colon.                           The exam was otherwise without abnormality on                            direct and retroflexion views. Complications:            No immediate complications. Estimated Blood Loss:     Estimated blood loss was minimal. Impression:               - Three 1 to 5 mm polyps in the transverse colon                            and in the ascending colon, removed with a cold                             snare. Resected and retrieved.                           - One 3 mm polyp in the distal rectum, removed with                            a cold snare. Resected and retrieved.                           - Diverticulosis in the left colon.                           - The examination was otherwise normal on direct                            and retroflexion views. Recommendation:           - Patient has a contact number available for  emergencies. The signs and symptoms of potential                            delayed complications were discussed with the                            patient. Return to normal activities tomorrow.                            Written discharge instructions were provided to the                            patient.                           - Resume previous diet.                           - Continue present medications.                           - Await pathology results.                           - Repeat colonoscopy is recommended for                            surveillance. The colonoscopy date will be                            determined after pathology results from today's                            exam become available for review. Lanice Folden L. Loletha Carrow, MD 08/29/2021 11:12:55 AM This report has been signed electronically.

## 2021-09-02 ENCOUNTER — Telehealth: Payer: Self-pay

## 2021-09-02 NOTE — Telephone Encounter (Signed)
Attempted to reach pt. With follow-up call following endoscopic procedure 08/29/2021.  LM on pt. Voice mail to call if she any questions or concerns.

## 2021-09-04 ENCOUNTER — Encounter: Payer: Self-pay | Admitting: Gastroenterology

## 2021-10-23 ENCOUNTER — Ambulatory Visit: Payer: No Typology Code available for payment source | Admitting: Emergency Medicine

## 2021-10-25 ENCOUNTER — Emergency Department (HOSPITAL_COMMUNITY)
Admission: EM | Admit: 2021-10-25 | Discharge: 2021-10-25 | Disposition: A | Discharge status: Home / Self Care | Payer: Medicaid Other | Attending: Emergency Medicine | Admitting: Emergency Medicine

## 2021-10-25 ENCOUNTER — Emergency Department (HOSPITAL_COMMUNITY): Payer: Medicaid Other

## 2021-10-25 ENCOUNTER — Other Ambulatory Visit: Payer: Self-pay

## 2021-10-25 ENCOUNTER — Encounter (HOSPITAL_COMMUNITY): Payer: Self-pay | Admitting: Emergency Medicine

## 2021-10-25 DIAGNOSIS — R053 Chronic cough: Secondary | ICD-10-CM

## 2021-10-25 DIAGNOSIS — I10 Essential (primary) hypertension: Secondary | ICD-10-CM | POA: Insufficient documentation

## 2021-10-25 DIAGNOSIS — R1032 Left lower quadrant pain: Secondary | ICD-10-CM | POA: Diagnosis not present

## 2021-10-25 DIAGNOSIS — E785 Hyperlipidemia, unspecified: Secondary | ICD-10-CM | POA: Insufficient documentation

## 2021-10-25 DIAGNOSIS — E1169 Type 2 diabetes mellitus with other specified complication: Secondary | ICD-10-CM | POA: Insufficient documentation

## 2021-10-25 DIAGNOSIS — R059 Cough, unspecified: Secondary | ICD-10-CM | POA: Diagnosis present

## 2021-10-25 DIAGNOSIS — Z20822 Contact with and (suspected) exposure to covid-19: Secondary | ICD-10-CM | POA: Diagnosis not present

## 2021-10-25 DIAGNOSIS — R0602 Shortness of breath: Secondary | ICD-10-CM | POA: Diagnosis not present

## 2021-10-25 DIAGNOSIS — Z7984 Long term (current) use of oral hypoglycemic drugs: Secondary | ICD-10-CM | POA: Diagnosis not present

## 2021-10-25 DIAGNOSIS — R0789 Other chest pain: Secondary | ICD-10-CM | POA: Diagnosis not present

## 2021-10-25 DIAGNOSIS — R1012 Left upper quadrant pain: Secondary | ICD-10-CM | POA: Insufficient documentation

## 2021-10-25 DIAGNOSIS — Z79899 Other long term (current) drug therapy: Secondary | ICD-10-CM | POA: Insufficient documentation

## 2021-10-25 LAB — RESP PANEL BY RT-PCR (FLU A&B, COVID) ARPGX2
Influenza A by PCR: NEGATIVE
Influenza B by PCR: NEGATIVE
SARS Coronavirus 2 by RT PCR: NEGATIVE

## 2021-10-25 LAB — URINALYSIS, ROUTINE W REFLEX MICROSCOPIC
Bilirubin Urine: NEGATIVE
Glucose, UA: NEGATIVE mg/dL
Hgb urine dipstick: NEGATIVE
Ketones, ur: NEGATIVE mg/dL
Leukocytes,Ua: NEGATIVE
Nitrite: NEGATIVE
Protein, ur: NEGATIVE mg/dL
Specific Gravity, Urine: 1.01 (ref 1.005–1.030)
pH: 8 (ref 5.0–8.0)

## 2021-10-25 LAB — COMPREHENSIVE METABOLIC PANEL
ALT: 14 U/L (ref 0–44)
AST: 19 U/L (ref 15–41)
Albumin: 4.1 g/dL (ref 3.5–5.0)
Alkaline Phosphatase: 86 U/L (ref 38–126)
Anion gap: 10 (ref 5–15)
BUN: 6 mg/dL — ABNORMAL LOW (ref 8–23)
CO2: 29 mmol/L (ref 22–32)
Calcium: 9.3 mg/dL (ref 8.9–10.3)
Chloride: 95 mmol/L — ABNORMAL LOW (ref 98–111)
Creatinine, Ser: 0.55 mg/dL (ref 0.44–1.00)
GFR, Estimated: >60 mL/min (ref >60)
Glucose, Bld: 186 mg/dL — ABNORMAL HIGH (ref 70–99)
Potassium: 3.7 mmol/L (ref 3.5–5.1)
Sodium: 134 mmol/L — ABNORMAL LOW (ref 135–145)
Total Bilirubin: 0.5 mg/dL (ref 0.3–1.2)
Total Protein: 7.7 g/dL (ref 6.5–8.1)

## 2021-10-25 LAB — CBC WITH DIFFERENTIAL/PLATELET
Abs Immature Granulocytes: 0.01 10*3/uL (ref 0.00–0.07)
Basophils Absolute: 0 10*3/uL (ref 0.0–0.1)
Basophils Relative: 1 %
Eosinophils Absolute: 0.1 10*3/uL (ref 0.0–0.5)
Eosinophils Relative: 1 %
HCT: 41.3 % (ref 36.0–46.0)
Hemoglobin: 13.7 g/dL (ref 12.0–15.0)
Immature Granulocytes: 0 %
Lymphocytes Relative: 31 %
Lymphs Abs: 2.3 10*3/uL (ref 0.7–4.0)
MCH: 28.4 pg (ref 26.0–34.0)
MCHC: 33.2 g/dL (ref 30.0–36.0)
MCV: 85.7 fL (ref 80.0–100.0)
Monocytes Absolute: 0.6 10*3/uL (ref 0.1–1.0)
Monocytes Relative: 9 %
Neutro Abs: 4.4 10*3/uL (ref 1.7–7.7)
Neutrophils Relative %: 58 %
Platelets: 355 10*3/uL (ref 150–400)
RBC: 4.82 MIL/uL (ref 3.87–5.11)
RDW: 12.3 % (ref 11.5–15.5)
WBC: 7.4 10*3/uL (ref 4.0–10.5)
nRBC: 0 % (ref 0.0–0.2)

## 2021-10-25 LAB — LIPASE, BLOOD: Lipase: 26 U/L (ref 11–51)

## 2021-10-25 LAB — TROPONIN I (HIGH SENSITIVITY)
Troponin I (High Sensitivity): 5 ng/L (ref <18)
Troponin I (High Sensitivity): 6 ng/L (ref <18)

## 2021-10-25 LAB — BRAIN NATRIURETIC PEPTIDE: B Natriuretic Peptide: 15.4 pg/mL (ref 0.0–100.0)

## 2021-10-25 MED ORDER — IOHEXOL 350 MG/ML SOLN
100.0000 mL | Freq: Once | INTRAVENOUS | Status: AC | PRN
  Administered 2021-10-25: 22:00:00 100 mL via INTRAVENOUS

## 2021-10-25 NOTE — ED Notes (Signed)
Ambulated PT in room on pulse oximetry. PT's O2 saturation remained at 92% on room air throughout ambulation.

## 2021-10-25 NOTE — ED Provider Notes (Signed)
[facility] EMERGENCY DEPARTMENT Provider Note   CSN: [id number] Arrival date & time: 10/25/21  1752     History Chief Complaint  Patient presents with   Chest Pain   Cough    Cheryl Hopkins is a 70 y.o. female with a past medical history of DM, funky ectasis, hypertension, hyperlipidemia, who presents today for evaluation of left-sided chest and back pain.  History obtained from patient and chart review using professional housa language interpreter.    Patient states that over the past 3 days she has had left-sided chest, back, and abdominal pain.  She has had increased cough.  No fevers.  No nausea, vomiting or diarrhea.  No constipation.  She has had cough like this before however never had the abdominal pain.  She denies any urinary symptoms. She reports she is taking all of her medications as prescribed.  She also notes acute on chronic dyspnea with exertion.  No leg swelling.  She denies any known sick contacts.  HPI     Past Medical History:  Diagnosis Date   Arthritis    knee   Bronchitis    Diabetes mellitus without complication ([place])    Several years.   Hyperlipidemia 09/30/2015   Hypertension    Stomach pain    with diarrhea    Patient Active Problem List   Diagnosis Date Noted   Bilateral leg pain 05/20/2021   Paresthesia of both lower extremities 05/20/2021   History of simple renal cyst 04/23/2021   Bronchiectasis without complication ([place]) 26/94/8546   Language barrier 08/14/2020   Healthcare maintenance 08/14/2020   Solitary pulmonary nodule 03/05/2020   Chronic pain of both knees 03/29/2019   Chronic lung disease 10/31/2015   Hypertension associated with diabetes ([place]) 10/31/2015   Dyslipidemia associated with type 2 diabetes mellitus ([place]) 09/30/2015   Hyperlipidemia 09/30/2015    Past Surgical History:  Procedure Laterality Date   COLONOSCOPY       OB History     Gravida  10   Para  9   Term  9   Preterm       AB  1   Living  9      SAB  1   IAB      Ectopic      Multiple      Live Births              Family History  Problem Relation Age of Onset   Bronchitis Sister    Hypertension Sister    Stroke Sister    Hepatitis Daughter    Colon cancer Neg Hx    Esophageal cancer Neg Hx    Rectal cancer Neg Hx    Stomach cancer Neg Hx     Social History   Tobacco Use   Smoking status: Never   Smokeless tobacco: Never   Tobacco comments:    Chews cola nut  Vaping Use   Vaping Use: Never used  Substance Use Topics   Alcohol use: No    Alcohol/week: 0.0 standard drinks   Drug use: No    Home Medications Prior to Admission medications   Medication Sig Start Date End Date Taking? Authorizing Provider  ACCU-CHEK GUIDE test strip USE 1 STRIP ONCE DAILY Patient not taking: Reported on 08/29/2021 06/05/20   Horald Pollen, MD  Accu-Chek Softclix Lancets lancets SMARTSIG:1 Topical Daily Patient not taking: Reported on 08/29/2021 04/23/21   Horald Pollen, MD  amLODipine (NORVASC) 10 MG  tablet Take 1 tablet (10 mg total) by mouth daily. 04/23/21 10/20/21  Horald Pollen, MD  Blood Glucose Monitoring Suppl w/Device KIT 1 Device by Does not apply route daily. 05/08/20   Horald Pollen, MD  gabapentin (NEURONTIN) 300 MG capsule Take 1 capsule (300 mg total) by mouth at bedtime. 05/20/21 08/29/21  Horald Pollen, MD  glimepiride (AMARYL) 2 MG tablet Take 1 tablet (2 mg total) by mouth daily with breakfast. TAKE ONE TABLET BY MOUTH WITH BREAKFAST 04/23/21 10/20/21  Horald Pollen, MD  metFORMIN (GLUCOPHAGE-XR) 500 MG 24 hr tablet Take 1 tablet (500 mg total) by mouth 2 (two) times daily with a meal. 04/23/21 10/20/21  Horald Pollen, MD  Multiple Vitamin (MULTIVITAMIN) tablet Take 1 tablet by mouth daily.    [provider]  rosuvastatin (CRESTOR) 20 MG tablet Take 1 tablet (20 mg total) by mouth daily. 04/23/21 10/20/21  Horald Pollen, MD  sitaGLIPtin (JANUVIA) 50 MG tablet Take 1 tablet (50 mg total) by mouth daily. 04/23/21 10/20/21  Horald Pollen, MD  Spacer/Aero-Holding Chambers (AEROCHAMBER PLUS) inhaler Use as instructed Patient not taking: No sig reported 04/23/21   Horald Pollen, MD  Tiotropium Bromide Monohydrate (SPIRIVA RESPIMAT) 2.5 MCG/ACT AERS Inhale 2 puffs into the lungs daily. Patient not taking: No sig reported 06/19/20   Brand Males, MD    Allergies    Patient has no known allergies.  Review of Systems   Review of Systems  Constitutional:  Negative for fever.  HENT:  Negative for congestion.   Respiratory:  Positive for cough and shortness of breath.   Cardiovascular:  Positive for chest pain. Negative for palpitations and leg swelling.  Gastrointestinal:  Positive for abdominal pain. Negative for blood in stool, constipation, diarrhea, nausea and vomiting.  Genitourinary:  Negative for dysuria and urgency.  Musculoskeletal:  Positive for back pain. Negative for neck pain.  Neurological:  Negative for weakness and headaches.  All other systems reviewed and are negative.  Physical Exam Updated Vital Signs BP (!) 116/59    Pulse 81    Temp 98.3 F (36.8 C) (Oral)    Resp 18    LMP 11/02/2010 (Approximate) Comment: postmenopausal   SpO2 93%   Physical Exam Vitals and nursing note reviewed.  Constitutional:      General: She is not in acute distress.    Appearance: She is not ill-appearing.  HENT:     Head: Atraumatic.  Eyes:     Conjunctiva/sclera: Conjunctivae normal.  Cardiovascular:     Rate and Rhythm: Normal rate and regular rhythm.     Pulses:          Posterior tibial pulses are 2+ on the right side and 2+ on the left side.     Heart sounds: Normal heart sounds.  Pulmonary:     Effort: Pulmonary effort is normal. No respiratory distress.     Breath sounds: Examination of the right-upper field reveals rales. Examination of the left-upper field reveals rales.  Examination of the right-middle field reveals rales. Examination of the left-middle field reveals rales. Examination of the right-lower field reveals rales. Examination of the left-lower field reveals rales. Rales present.  Chest:     Chest wall: Tenderness present.     Comments: Lower left chest anteriorly and laterally TTP when palpated with back of hand.  Abdominal:     General: Abdomen is flat. Bowel sounds are normal. There is no distension.  Palpations: Abdomen is soft.     Tenderness: There is abdominal tenderness (Left upper and lower abdomen.).     Comments: Unable to distinguish between pain with L CVA tenderness to palpation vs percussion even with language interpreter.   Musculoskeletal:     Cervical back: Normal range of motion and neck supple.     Right lower leg: No tenderness. No edema.     Left lower leg: No tenderness. No edema.     Comments: No obvious acute injury  Skin:    General: Skin is warm.  Neurological:     Mental Status: She is alert.     Comments: Awake and alert, answers all questions appropriately.  Speech is not slurred.  Psychiatric:        Mood and Affect: Mood normal.        Behavior: Behavior normal.    ED Results / Procedures / Treatments   Labs (all labs ordered are listed, but only abnormal results are displayed) Labs Reviewed  COMPREHENSIVE METABOLIC PANEL - Abnormal; Notable for the following components:      Result Value   Sodium 134 (*)    Chloride 95 (*)    Glucose, Bld 186 (*)    BUN 6 (*)    All other components within normal limits  RESP PANEL BY RT-PCR (FLU A&B, COVID) ARPGX2  LIPASE, BLOOD  CBC WITH DIFFERENTIAL/PLATELET  URINALYSIS, ROUTINE W REFLEX MICROSCOPIC  BRAIN NATRIURETIC PEPTIDE  TROPONIN I (HIGH SENSITIVITY)  TROPONIN I (HIGH SENSITIVITY)    EKG EKG Interpretation  Date/Time:  Saturday October 25 2021 19:59:45 EST Ventricular Rate:  77 PR Interval:  179 QRS Duration: 89 QT Interval:  404 QTC  Calculation: 458 R Axis:   65 Text Interpretation: Sinus rhythm Atrial premature complex Abnormal R-wave progression, early transition No acute changes No significant change since last tracing Confirmed by Varney Biles [id number]) on 10/25/2021 9:24:15 PM  Radiology DG Chest 2 View  Result Date: 10/25/2021 CLINICAL DATA:  Difficulty breathing, chest pain EXAM: CHEST - 2 VIEW COMPARISON:  03/05/2020, 04/24/2020 FINDINGS: The heart size and mediastinal contours are within normal limits. Somewhat nodular opacities at the left lung base are similar to prior and most compatible with bronchiectasis and mucous impaction, as seen on prior CT. No new focal airspace consolidation. No pleural effusion or pneumothorax. The visualized skeletal structures are unremarkable. IMPRESSION: Nodular opacities at the left lung base most compatible with chronic bronchiectasis and mucous impaction, as seen on prior CT. No new focal airspace consolidation. Electronically Signed   By: Davina Poke D.O.   On: 10/25/2021 18:52   CT Angio Chest PE W/Cm &/Or Wo Cm  Result Date: 10/25/2021 CLINICAL DATA:  Cough and chest and abdominal pain EXAM: CT ANGIOGRAPHY CHEST CT ABDOMEN AND PELVIS WITH CONTRAST TECHNIQUE: Multidetector CT imaging of the chest was performed using the standard protocol during bolus administration of intravenous contrast. Multiplanar CT image reconstructions and MIPs were obtained to evaluate the vascular anatomy. Multidetector CT imaging of the abdomen and pelvis was performed using the standard protocol during bolus administration of intravenous contrast. CONTRAST:  153mL OMNIPAQUE IOHEXOL 350 MG/ML SOLN COMPARISON:  Plain film from earlier in the same day, chest from 04/24/2020 FINDINGS: CTA CHEST FINDINGS Cardiovascular: Thoracic aorta demonstrates atherosclerotic calcifications although no aneurysmal dilatation or dissection is noted. No cardiac enlargement is seen. Pulmonary artery is well visualized. No  focal filling defect to suggest pulmonary embolism is noted. Mild coronary calcifications are noted. Mediastinum/Nodes: Thoracic inlet  is within normal limits. No sizable hilar or mediastinal adenopathy is noted. The esophagus as visualized is within normal limits. Lungs/Pleura: Patchy mosaic attenuation is noted throughout both lungs consistent with air trapping. Mild emphysematous changes are seen. Changes of cystic bronchiectasis are noted worst in the left lower lobe stable in appearance the prior exam. No new focal infiltrate or sizable effusion is seen. Musculoskeletal: Degenerative changes of the thoracic spine are noted. No acute rib abnormality is noted. Review of the MIP images confirms the above findings. CT ABDOMEN and PELVIS FINDINGS Hepatobiliary: No focal liver abnormality is seen. No gallstones, gallbladder wall thickening, or biliary dilatation. Pancreas: Unremarkable. No pancreatic ductal dilatation or surrounding inflammatory changes. Spleen: Normal in size without focal abnormality. Adrenals/Urinary Tract: Adrenal glands are within normal limits. Kidneys demonstrate a normal enhancement pattern bilaterally. Scattered renal cysts are seen. No renal calculi or obstructive changes are noted. Normal excretion of contrast is noted. Bladder is partially distended. Stomach/Bowel: Scattered diverticular changes noted without evidence of diverticulitis. More proximal colon appears within normal limits. The appendix is unremarkable. Small bowel and stomach are within normal limits. Vascular/Lymphatic: Aortic atherosclerosis. No enlarged abdominal or pelvic lymph nodes. Reproductive: Uterus and bilateral adnexa are unremarkable. Other: No abdominal wall hernia or abnormality. No abdominopelvic ascites. Musculoskeletal: No acute or significant osseous findings. Review of the MIP images confirms the above findings. IMPRESSION: CTA of the chest: No acute pulmonary emboli are seen. Changes in the lungs  consistent with air trapping and emphysema. Cystic bronchiectasis is noted in the lower lobes worst on the left stable in appearance from the prior exam. CT of the abdomen and pelvis: No acute abnormality noted. Mild diverticular change of the colon is seen. Electronically Signed   By: Inez Catalina M.D.   On: 10/25/2021 22:33   CT Abdomen Pelvis W Contrast  Result Date: 10/25/2021 CLINICAL DATA:  Cough and chest and abdominal pain EXAM: CT ANGIOGRAPHY CHEST CT ABDOMEN AND PELVIS WITH CONTRAST TECHNIQUE: Multidetector CT imaging of the chest was performed using the standard protocol during bolus administration of intravenous contrast. Multiplanar CT image reconstructions and MIPs were obtained to evaluate the vascular anatomy. Multidetector CT imaging of the abdomen and pelvis was performed using the standard protocol during bolus administration of intravenous contrast. CONTRAST:  136mL OMNIPAQUE IOHEXOL 350 MG/ML SOLN COMPARISON:  Plain film from earlier in the same day, chest from 04/24/2020 FINDINGS: CTA CHEST FINDINGS Cardiovascular: Thoracic aorta demonstrates atherosclerotic calcifications although no aneurysmal dilatation or dissection is noted. No cardiac enlargement is seen. Pulmonary artery is well visualized. No focal filling defect to suggest pulmonary embolism is noted. Mild coronary calcifications are noted. Mediastinum/Nodes: Thoracic inlet is within normal limits. No sizable hilar or mediastinal adenopathy is noted. The esophagus as visualized is within normal limits. Lungs/Pleura: Patchy mosaic attenuation is noted throughout both lungs consistent with air trapping. Mild emphysematous changes are seen. Changes of cystic bronchiectasis are noted worst in the left lower lobe stable in appearance the prior exam. No new focal infiltrate or sizable effusion is seen. Musculoskeletal: Degenerative changes of the thoracic spine are noted. No acute rib abnormality is noted. Review of the MIP images  confirms the above findings. CT ABDOMEN and PELVIS FINDINGS Hepatobiliary: No focal liver abnormality is seen. No gallstones, gallbladder wall thickening, or biliary dilatation. Pancreas: Unremarkable. No pancreatic ductal dilatation or surrounding inflammatory changes. Spleen: Normal in size without focal abnormality. Adrenals/Urinary Tract: Adrenal glands are within normal limits. Kidneys demonstrate a normal enhancement pattern bilaterally. Scattered  renal cysts are seen. No renal calculi or obstructive changes are noted. Normal excretion of contrast is noted. Bladder is partially distended. Stomach/Bowel: Scattered diverticular changes noted without evidence of diverticulitis. More proximal colon appears within normal limits. The appendix is unremarkable. Small bowel and stomach are within normal limits. Vascular/Lymphatic: Aortic atherosclerosis. No enlarged abdominal or pelvic lymph nodes. Reproductive: Uterus and bilateral adnexa are unremarkable. Other: No abdominal wall hernia or abnormality. No abdominopelvic ascites. Musculoskeletal: No acute or significant osseous findings. Review of the MIP images confirms the above findings. IMPRESSION: CTA of the chest: No acute pulmonary emboli are seen. Changes in the lungs consistent with air trapping and emphysema. Cystic bronchiectasis is noted in the lower lobes worst on the left stable in appearance from the prior exam. CT of the abdomen and pelvis: No acute abnormality noted. Mild diverticular change of the colon is seen. Electronically Signed   By: Inez Catalina M.D.   On: 10/25/2021 22:33    Procedures Procedures   Medications Ordered in ED Medications  iohexol (OMNIPAQUE) 350 MG/ML injection 100 mL (100 mLs Intravenous Contrast Given 10/25/21 2220)    ED Course  I have reviewed the triage vital signs and the nursing notes.  Pertinent labs & imaging results that were available during my care of the patient were reviewed by me and considered in my  medical decision making (see chart for details).  Clinical Course as of 10/25/21 2328  Sat Oct 25, 2021  2246 CT Angio Chest PE W/Cm &/Or Wo Cm CTA PE study and CT abdomen pelvis with contrast without cause of patient's symptoms found.  Patient will p.o. challenge.  Ambulate with pulse ox to ensure she does not desat. [EH]    Clinical Course User Index [EH] Ollen Gross   MDM Rules/Calculators/A&P                          Patient is a 70 year old woman who presents today for evaluation of 3 days of left-sided chest and abdominal pain.  She also reports worsening dyspnea with exertion. On exam she has diffuse rales bilaterally.  Her chest x-ray showed nodular opacities in the left lung base consistent with prior CT scan without any other changes. Labs are obtained and reviewed, initial EKG is nonischemic.  CBC is unremarkable.  CMP without significant acute electrolyte derangements.  Lipase is not elevated, troponin initial is 5.  Her UA is without hematuria, evidence of infection or other abnormality. Given her age, combined with her abdominal tenderness she will require CT scan of her abdomen. As she does report dyspnea with exertion worsening recently and a abnormal chest x-ray we will also obtain PE study to evaluate for worsening changes, possible PE.  Using a dimer rule out was considered, however given her shortness of breath that is suddenly worsened recently, and the abnormal chest x-ray there would be strong consideration for a CT scan on the chest for additional detail and evaluation, therefore will obtain CTA PE study.   CTA PE study shows cystic bronchiectasis stable from prior exams with no PE noted. CT abdomen shows diverticulosis without diverticulitis. CTs did not show cause for patient's symptoms. She was able to ambulate in the emergency room maintaining oxygen saturations around 92.  This is about her baseline, and she did not have any shortness of breath  or other symptoms.  Recommended following up with her pulmonologist and primary care doctor.  Return precautions were discussed  with patient who states their understanding.  At the time of discharge patient denied any unaddressed complaints or concerns.  Patient is agreeable for discharge home.  Note: Portions of this report may have been transcribed using voice recognition software. Every effort was made to ensure accuracy; however, inadvertent computerized transcription errors may be present    Final Clinical Impression(s) / ED Diagnoses Final diagnoses:  Chronic cough  Left upper quadrant abdominal pain  Left lower quadrant abdominal pain    Rx / DC Orders ED Discharge Orders     None        Ollen Gross 10/25/21 2328    Varney Biles, MD 10/26/21 2337

## 2021-10-25 NOTE — ED Triage Notes (Signed)
Pt reports left sided abdominal pain and body aches. Pt reports takes aleve with minimal relief. Denies fevers, nausea, or vomiting.

## 2021-10-25 NOTE — Discharge Instructions (Signed)
Please schedule a follow-up appointment with your pulmonologist. Please follow-up also with your primary care doctor. If you develop fevers, your symptoms worsen, or you have any other concerns please seek additional medical care and evaluation.

## 2021-10-25 NOTE — ED Provider Notes (Signed)
Emergency Medicine Provider Triage Evaluation Note  Cheryl Hopkins , a 70 y.o. female  was evaluated in triage.  Pt complains of left-sided chest and back pain.  Patient states that she was coughing for about 3 days.  She states afterward she has had more difficulty breathing or taking a full breath.  She endorses pain to the left back to palpation as well as the left side.  She denies new trauma or falls on that side.  There was a note from triage about abdominal pain.  It is somewhat difficult to ascertain if she is having abdominal pain due to language barrier.  However she points to her left ribs and back.  She denies nausea, vomiting, diarrhea, urinary symptoms or fevers. Review of Systems  Positive: See above Negative:   Physical Exam  BP (!) 148/77 (BP Location: Left Arm)    Pulse 87    Temp 98.3 F (36.8 C) (Oral)    Resp 16    LMP 11/02/2010 (Approximate) Comment: postmenopausal   SpO2 93%  Gen:   Awake, no distress   Resp:  Normal effort, more shallow breaths, bilateral crackles MSK:   Moves extremities without difficulty  Other:  Abdomen is rounded, soft and nontender to palpation. Given tenderness to palpation of the lower left back around the flank we will still obtain basic lab work as well as UA for possible CVA tenderness. Medical Decision Making  Medically screening exam initiated at 6:19 PM.  Appropriate orders placed.  Bailea Federici was informed that the remainder of the evaluation will be completed by another provider, this initial triage assessment does not replace that evaluation, and the importance of remaining in the ED until their evaluation is complete.     Mickie Hillier, PA-C 10/25/21 Vernelle Emerald    Wyvonnia Dusky, MD 10/25/21 [phone]

## 2021-11-05 ENCOUNTER — Other Ambulatory Visit: Payer: Self-pay

## 2021-11-05 ENCOUNTER — Ambulatory Visit (INDEPENDENT_AMBULATORY_CARE_PROVIDER_SITE_OTHER): Payer: Medicaid Other | Admitting: Emergency Medicine

## 2021-11-05 ENCOUNTER — Encounter: Payer: Self-pay | Admitting: Emergency Medicine

## 2021-11-05 VITALS — BP 122/60 | HR 70 | Ht 68.0 in | Wt 161.0 lb

## 2021-11-05 DIAGNOSIS — E1159 Type 2 diabetes mellitus with other circulatory complications: Secondary | ICD-10-CM | POA: Diagnosis not present

## 2021-11-05 DIAGNOSIS — J479 Bronchiectasis, uncomplicated: Secondary | ICD-10-CM

## 2021-11-05 DIAGNOSIS — I152 Hypertension secondary to endocrine disorders: Secondary | ICD-10-CM

## 2021-11-05 DIAGNOSIS — Z23 Encounter for immunization: Secondary | ICD-10-CM

## 2021-11-05 DIAGNOSIS — E1169 Type 2 diabetes mellitus with other specified complication: Secondary | ICD-10-CM

## 2021-11-05 DIAGNOSIS — E785 Hyperlipidemia, unspecified: Secondary | ICD-10-CM

## 2021-11-05 LAB — POCT GLYCOSYLATED HEMOGLOBIN (HGB A1C): Hemoglobin A1C: 6.8 % — AB (ref 4.0–5.6)

## 2021-11-05 MED ORDER — GLIMEPIRIDE 2 MG PO TABS: 2.0000 mg | ORAL_TABLET | Freq: Every day | ORAL | 1 refills | Status: DC

## 2021-11-05 MED ORDER — AMLODIPINE BESYLATE 10 MG PO TABS: 10.0000 mg | ORAL_TABLET | Freq: Every day | ORAL | 1 refills | Status: DC

## 2021-11-05 MED ORDER — METFORMIN HCL ER 500 MG PO TB24: 500.0000 mg | ORAL_TABLET | Freq: Two times a day (BID) | ORAL | 1 refills | Status: DC

## 2021-11-05 MED ORDER — ROSUVASTATIN CALCIUM 20 MG PO TABS: 20.0000 mg | ORAL_TABLET | Freq: Every day | ORAL | 1 refills | Status: DC

## 2021-11-05 MED ORDER — SITAGLIPTIN PHOSPHATE 50 MG PO TABS: 50.0000 mg | ORAL_TABLET | Freq: Every day | ORAL | 1 refills | Status: DC

## 2021-11-05 NOTE — Patient Instructions (Signed)
Health Maintenance After Age 69 After age 69, you are at a higher risk for certain long-term diseases and infections as well as injuries from falls. Falls are a major cause of broken bones and head injuries in people who are older than age 69. Getting regular preventive care can help to keep you healthy and well. Preventive care includes getting regular testing and making lifestyle changes as recommended by your health care provider. Talk with your health care provider about: Which screenings and tests you should have. A screening is a test that checks for a disease when you have no symptoms. A diet and exercise plan that is right for you. What should I know about screenings and tests to prevent falls? Screening and testing are the best ways to find a health problem early. Early diagnosis and treatment give you the best chance of managing medical conditions that are common after age 69. Certain conditions and lifestyle choices may make you more likely to have a fall. Your health care provider may recommend: Regular vision checks. Poor vision and conditions such as cataracts can make you more likely to have a fall. If you wear glasses, make sure to get your prescription updated if your vision changes. Medicine review. Work with your health care provider to regularly review all of the medicines you are taking, including over-the-counter medicines. Ask your health care provider about any side effects that may make you more likely to have a fall. Tell your health care provider if any medicines that you take make you feel dizzy or sleepy. Strength and balance checks. Your health care provider may recommend certain tests to check your strength and balance while standing, walking, or changing positions. Foot health exam. Foot pain and numbness, as well as not wearing proper footwear, can make you more likely to have a fall. Screenings, including: Osteoporosis screening. Osteoporosis is a condition that causes  the bones to get weaker and break more easily. Blood pressure screening. Blood pressure changes and medicines to control blood pressure can make you feel dizzy. Depression screening. You may be more likely to have a fall if you have a fear of falling, feel depressed, or feel unable to do activities that you used to do. Alcohol use screening. Using too much alcohol can affect your balance and may make you more likely to have a fall. Follow these instructions at home: Lifestyle Do not drink alcohol if: Your health care provider tells you not to drink. If you drink alcohol: Limit how much you have to: 0-1 drink a day for women. 0-2 drinks a day for men. Know how much alcohol is in your drink. In the [place] one drink equals one 12 oz bottle of beer (355 mL), one 5 oz glass of wine (148 mL), or one 1 oz glass of hard liquor (44 mL). Do not use any products that contain nicotine or tobacco. These products include cigarettes, chewing tobacco, and vaping devices, such as e-cigarettes. If you need help quitting, ask your health care provider. Activity  Follow a regular exercise program to stay fit. This will help you maintain your balance. Ask your health care provider what types of exercise are appropriate for you. If you need a cane or walker, use it as recommended by your health care provider. Wear supportive shoes that have nonskid soles. Safety  Remove any tripping hazards, such as rugs, cords, and clutter. Install safety equipment such as grab bars in bathrooms and safety rails on stairs. Keep rooms and walkways   well-lit. General instructions Talk with your health care provider about your risks for falling. Tell your health care provider if: You fall. Be sure to tell your health care provider about all falls, even ones that seem minor. You feel dizzy, tiredness (fatigue), or off-balance. Take over-the-counter and prescription medicines only as told by your health care provider. These include  supplements. Eat a healthy diet and maintain a healthy weight. A healthy diet includes low-fat dairy products, low-fat (lean) meats, and fiber from whole grains, beans, and lots of fruits and vegetables. Stay current with your vaccines. Schedule regular health, dental, and eye exams. Summary Having a healthy lifestyle and getting preventive care can help to protect your health and wellness after age 69. Screening and testing are the best way to find a health problem early and help you avoid having a fall. Early diagnosis and treatment give you the best chance for managing medical conditions that are more common for people who are older than age 69. Falls are a major cause of broken bones and head injuries in people who are older than age 69. Take precautions to prevent a fall at home. Work with your health care provider to learn what changes you can make to improve your health and wellness and to prevent falls. This information is not intended to replace advice given to you by your health care provider. Make sure you discuss any questions you have with your health care provider. Document Revised: 03/10/2021 Document Reviewed: 03/10/2021 Elsevier Patient Education  2022 [employer]vier Inc[employer]  

## 2021-11-05 NOTE — Assessment & Plan Note (Signed)
Well-controlled hypertension.  Continue amlodipine 10 mg daily. Well-controlled diabetes with hemoglobin A1c of 6.8.  Continue metformin 500 mg twice a day and Januvia 50 mg daily.  Also continue glimepiride 2 mg daily. Diet and nutrition discussed. Follow-up in 6 months. Patient will be traveling to [place], [place] later this month.

## 2021-11-05 NOTE — Progress Notes (Signed)
Cheryl Hopkins 71 y.o.   Chief Complaint  Patient presents with   Hypertension   Diabetes    F/u    HISTORY OF PRESENT ILLNESS: This is a 71 y.o. female with history of hypertension and diabetes here for follow-up. Was seen in the emergency room on 10/25/2021 for evaluation of left-sided chest pain and back pain. Work-up including blood work, CT scan of abdomen and chest, cardiac enzymes, EKG, within normal limits. Assessment and plan from emergency department visit as follows: MDM Rules/Calculators/A&P                           Patient is a 71 year old woman who presents today for evaluation of 3 days of left-sided chest and abdominal pain.  She also reports worsening dyspnea with exertion. On exam she has diffuse rales bilaterally.  Her chest x-ray showed nodular opacities in the left lung base consistent with prior CT scan without any other changes. Labs are obtained and reviewed, initial EKG is nonischemic.  CBC is unremarkable.  CMP without significant acute electrolyte derangements.  Lipase is not elevated, troponin initial is 5.   Her UA is without hematuria, evidence of infection or other abnormality. Given her age, combined with her abdominal tenderness she will require CT scan of her abdomen. As she does report dyspnea with exertion worsening recently and a abnormal chest x-ray we will also obtain PE study to evaluate for worsening changes, possible PE.  Using a dimer rule out was considered, however given her shortness of breath that is suddenly worsened recently, and the abnormal chest x-ray there would be strong consideration for a CT scan on the chest for additional detail and evaluation, therefore will obtain CTA PE study.    CTA PE study shows cystic bronchiectasis stable from prior exams with no PE noted. CT abdomen shows diverticulosis without diverticulitis. CTs did not show cause for patient's symptoms. She was able to ambulate in the emergency room maintaining oxygen  saturations around 92.  This is about her baseline, and she did not have any shortness of breath or other symptoms.   Recommended following up with her pulmonologist and primary care doctor.  Lab Results  Component Value Date   HGBA1C 7.2 (A) 04/23/2021   BP Readings from Last 3 Encounters:  11/05/21 122/60  10/25/21 (!) 116/59  08/29/21 120/60   Today she has no complaints or medical concerns.  Accompanied by son who is helping with translation. Patient will be traveling to [place] later this month.  Requesting 36-monthrefill on medications.   Hypertension Pertinent negatives include no chest pain, headaches, palpitations or shortness of breath.  Diabetes Pertinent negatives for hypoglycemia include no dizziness or headaches. Pertinent negatives for diabetes include no chest pain.    Prior to Admission medications   Medication Sig Start Date End Date Taking? Authorizing Provider  ACCU-CHEK GUIDE test strip USE 1 STRIP ONCE DAILY 06/05/20  Yes SHorald Pollen MD  Accu-Chek Softclix Lancets lancets SMARTSIG:1 Topical Daily 04/23/21  Yes Corry Ihnen, MInes Bloomer MD  amLODipine (NORVASC) 10 MG tablet Take 1 tablet (10 mg total) by mouth daily. 04/23/21 11/05/21 Yes Tulsi Crossett, MInes Bloomer MD  Blood Glucose Monitoring Suppl w/Device KIT 1 Device by Does not apply route daily. 05/08/20  Yes Ermagene Saidi, MInes Bloomer MD  gabapentin (NEURONTIN) 300 MG capsule Take 1 capsule (300 mg total) by mouth at bedtime. 05/20/21 11/05/21 Yes Hailie Searight, MInes Bloomer MD  glimepiride (AMARYL) 2 MG tablet Take  1 tablet (2 mg total) by mouth daily with breakfast. TAKE ONE TABLET BY MOUTH WITH BREAKFAST 04/23/21 11/05/21 Yes Django Nguyen, Ines Bloomer, MD  metFORMIN (GLUCOPHAGE-XR) 500 MG 24 hr tablet Take 1 tablet (500 mg total) by mouth 2 (two) times daily with a meal. 04/23/21 11/05/21 Yes Janaysha Depaulo, Ines Bloomer, MD  Multiple Vitamin (MULTIVITAMIN) tablet Take 1 tablet by mouth daily.   Yes [provider]   rosuvastatin (CRESTOR) 20 MG tablet Take 1 tablet (20 mg total) by mouth daily. 04/23/21 11/05/21 Yes Logun Colavito, Ines Bloomer, MD  sitaGLIPtin (JANUVIA) 50 MG tablet Take 1 tablet (50 mg total) by mouth daily. 04/23/21 11/05/21 Yes Anala Whisenant, Ines Bloomer, MD  Spacer/Aero-Holding Chambers (AEROCHAMBER PLUS) inhaler Use as instructed Patient not taking: Reported on 08/13/2021 04/23/21   Horald Pollen, MD  Tiotropium Bromide Monohydrate (SPIRIVA RESPIMAT) 2.5 MCG/ACT AERS Inhale 2 puffs into the lungs daily. 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   Hyperlipidemia 09/30/2015   Hypertension    Stomach pain    with diarrhea    Past Surgical History:  Procedure Laterality Date   COLONOSCOPY      Social History   Socioeconomic History   Marital status: Widowed    Spouse name: Widowed since 2010   Number of children: 9   Years of education: <8   Highest education level: Not on file  Occupational History   Occupation: Housewife  Tobacco Use   Smoking status: Never   Smokeless tobacco: Never   Tobacco  comments:    Chews cola nut  Vaping Use   Vaping Use: Never used  Substance and Sexual Activity   Alcohol use: No    Alcohol/week: 0.0 standard drinks   Drug use: No   Sexual activity: Not Currently  Other Topics Concern   Not on file  Social History Narrative      Widow   Originally from [place] - Came to [place]. In 2016   Lives with son and his wife and their 3 children.        Exercise - no exercise      Seatbelt - 100%   Gun in home - no   Social Determinants of Health   Financial Resource Strain: Not on file  Food Insecurity: Not on file  Transportation Needs: Not on file  Physical Activity: Not on file  Stress: Not on file  Social Connections: Not on file  Intimate Partner Violence: Not on file    Family History  Problem Relation Age of Onset   Bronchitis Sister    Hypertension Sister    Stroke Sister  Hepatitis Daughter    Colon cancer Neg Hx    Esophageal cancer Neg Hx    Rectal cancer Neg Hx    Stomach cancer Neg Hx      Review of Systems  Constitutional: Negative.  Negative for chills and fever.  HENT: Negative.  Negative for congestion and sore throat.   Respiratory: Negative.  Negative for cough and shortness of breath.   Cardiovascular:  Negative for chest pain and palpitations.  Gastrointestinal: Negative.  Negative for abdominal pain, blood in stool, diarrhea, melena, nausea and vomiting.  Genitourinary: Negative.  Negative for dysuria and hematuria.  Skin: Negative.  Negative for rash.  Neurological:  Negative for dizziness and headaches.  All other systems reviewed and are negative.  Today's Vitals   11/05/21 0901  BP: 122/60  Pulse: 70  SpO2: 97%  Weight: 161 lb (73 kg)  Height: _0  (1.727 m)   Body mass index is 24.48 kg/m. Wt Readings from Last 3 Encounters:  11/05/21 161 lb (73 kg)  08/29/21 159 lb (72.1 kg)  08/13/21 159 lb (72.1 kg)    Physical Exam Vitals reviewed.  Constitutional:      Appearance: Normal appearance.   HENT:     Head: Normocephalic.  Eyes:     Extraocular Movements: Extraocular movements intact.     Pupils: Pupils are equal, round, and reactive to light.  Cardiovascular:     Rate and Rhythm: Normal rate and regular rhythm.     Pulses: Normal pulses.     Heart sounds: Normal heart sounds.  Pulmonary:     Effort: Pulmonary effort is normal.     Breath sounds: Rales (Dry crackles bilaterally) present.  Abdominal:     Palpations: Abdomen is soft.     Tenderness: There is no abdominal tenderness.  Musculoskeletal:     Cervical back: Normal range of motion and neck supple. No tenderness.  Lymphadenopathy:     Cervical: No cervical adenopathy.  Skin:    General: Skin is warm and dry.     Capillary Refill: Capillary refill takes less than 2 seconds.  Neurological:     General: No focal deficit present.     Mental Status: She is alert and oriented to person, place, and time.  Psychiatric:        Mood and Affect: Mood normal.        Behavior: Behavior normal.    Results for orders placed or performed in visit on 11/05/21 (from the past 24 hour(s))  POCT glycosylated hemoglobin (Hb A1C)     Status: Abnormal   Collection Time: 11/05/21  9:19 AM  Result Value Ref Range   Hemoglobin A1C 6.8 (A) 4.0 - 5.6 %   HbA1c POC (<> result, manual entry)     HbA1c, POC (prediabetic range)     HbA1c, POC (controlled diabetic range)      ASSESSMENT & PLAN: Problem List Items Addressed This Visit       Cardiovascular and Mediastinum   Hypertension associated with diabetes ([facility]) - Primary    Well-controlled hypertension.  Continue amlodipine 10 mg daily. Well-controlled diabetes with hemoglobin A1c of 6.8.  Continue metformin 500 mg twice a day and Januvia 50 mg daily.  Also continue glimepiride 2 mg daily. Diet and nutrition discussed. Follow-up in 6 months. Patient will be traveling to [place] later this month.      Relevant Medications   sitaGLIPtin (JANUVIA) 50 MG tablet    rosuvastatin (CRESTOR) 20 MG tablet  metFORMIN (GLUCOPHAGE-XR) 500 MG 24 hr tablet   glimepiride (AMARYL) 2 MG tablet   amLODipine (NORVASC) 10 MG tablet   Other Relevant Orders   POCT glycosylated hemoglobin (Hb A1C) (Completed)     Respiratory   Bronchiectasis without complication ([facility])    Stable.  No complications.  No infection.  No hemoptysis.        Endocrine   Dyslipidemia associated with type 2 diabetes mellitus ([facility])    Diet and nutrition discussed.  Continue rosuvastatin 20 mg daily. The 10-year ASCVD risk score (Arnett DK, et al., 2019) is: 17.1%   Values used to calculate the score:     Age: 62 years     Sex: Female     Is Non-Hispanic African American: No     Diabetic: Yes     Tobacco smoker: No     Systolic Blood Pressure: 841 mmHg     Is BP treated: Yes     HDL Cholesterol: 52.1 mg/dL     Total Cholesterol: 186 mg/dL       Relevant Medications   sitaGLIPtin (JANUVIA) 50 MG tablet   rosuvastatin (CRESTOR) 20 MG tablet   metFORMIN (GLUCOPHAGE-XR) 500 MG 24 hr tablet   glimepiride (AMARYL) 2 MG tablet   Other Visit Diagnoses     Flu vaccine need       Relevant Orders   Flu Vaccine QUAD High Dose(Fluad) (Completed)      Patient Instructions  Health Maintenance After Age 49 After age 60, you are at a higher risk for certain long-term diseases and infections as well as injuries from falls. Falls are a major cause of broken bones and head injuries in people who are older than age 17. Getting regular preventive care can help to keep you healthy and well. Preventive care includes getting regular testing and making lifestyle changes as recommended by your health care provider. Talk with your health care provider about: Which screenings and tests you should have. A screening is a test that checks for a disease when you have no symptoms. A diet and exercise plan that is right for you. What should I know about screenings and tests to prevent falls? Screening and  testing are the best ways to find a health problem early. Early diagnosis and treatment give you the best chance of managing medical conditions that are common after age 42. Certain conditions and lifestyle choices may make you more likely to have a fall. Your health care provider may recommend: Regular vision checks. Poor vision and conditions such as cataracts can make you more likely to have a fall. If you wear glasses, make sure to get your prescription updated if your vision changes. Medicine review. Work with your health care provider to regularly review all of the medicines you are taking, including over-the-counter medicines. Ask your health care provider about any side effects that may make you more likely to have a fall. Tell your health care provider if any medicines that you take make you feel dizzy or sleepy. Strength and balance checks. Your health care provider may recommend certain tests to check your strength and balance while standing, walking, or changing positions. Foot health exam. Foot pain and numbness, as well as not wearing proper footwear, can make you more likely to have a fall. Screenings, including: Osteoporosis screening. Osteoporosis is a condition that causes the bones to get weaker and break more easily. Blood pressure screening. Blood pressure changes and medicines to control blood pressure can make you  feel dizzy. Depression screening. You may be more likely to have a fall if you have a fear of falling, feel depressed, or feel unable to do activities that you used to do. Alcohol use screening. Using too much alcohol can affect your balance and may make you more likely to have a fall. Follow these instructions at home: Lifestyle Do not drink alcohol if: Your health care provider tells you not to drink. If you drink alcohol: Limit how much you have to: 0-1 drink a day for women. 0-2 drinks a day for men. Know how much alcohol is in your drink. In the [place] one drink  equals one 12 oz bottle of beer (355 mL), one 5 oz glass of wine (148 mL), or one 1 oz glass of hard liquor (44 mL). Do not use any products that contain nicotine or tobacco. These products include cigarettes, chewing tobacco, and vaping devices, such as e-cigarettes. If you need help quitting, ask your health care provider. Activity  Follow a regular exercise program to stay fit. This will help you maintain your balance. Ask your health care provider what types of exercise are appropriate for you. If you need a cane or walker, use it as recommended by your health care provider. Wear supportive shoes that have nonskid soles. Safety  Remove any tripping hazards, such as rugs, cords, and clutter. Install safety equipment such as grab bars in bathrooms and safety rails on stairs. Keep rooms and walkways well-lit. General instructions Talk with your health care provider about your risks for falling. Tell your health care provider if: You fall. Be sure to tell your health care provider about all falls, even ones that seem minor. You feel dizzy, tiredness (fatigue), or off-balance. Take over-the-counter and prescription medicines only as told by your health care provider. These include supplements. Eat a healthy diet and maintain a healthy weight. A healthy diet includes low-fat dairy products, low-fat (lean) meats, and fiber from whole grains, beans, and lots of fruits and vegetables. Stay current with your vaccines. Schedule regular health, dental, and eye exams. Summary Having a healthy lifestyle and getting preventive care can help to protect your health and wellness after age 47. Screening and testing are the best way to find a health problem early and help you avoid having a fall. Early diagnosis and treatment give you the best chance for managing medical conditions that are more common for people who are older than age 45. Falls are a major cause of broken bones and head injuries in people  who are older than age 62. Take precautions to prevent a fall at home. Work with your health care provider to learn what changes you can make to improve your health and wellness and to prevent falls. This information is not intended to replace advice given to you by your health care provider. Make sure you discuss any questions you have with your health care provider. Document Revised: 03/10/2021 Document Reviewed: 03/10/2021 Elsevier Patient Education  2022 [place], MD [facility]

## 2021-11-05 NOTE — Assessment & Plan Note (Signed)
Diet and nutrition discussed.  Continue rosuvastatin 20 mg daily. The 10-year ASCVD risk score (Arnett DK, et al., 2019) is: 17.1%   Values used to calculate the score:     Age: 71 years     Sex: Female     Is Non-Hispanic African American: No     Diabetic: Yes     Tobacco smoker: No     Systolic Blood Pressure: 110 mmHg     Is BP treated: Yes     HDL Cholesterol: 52.1 mg/dL     Total Cholesterol: 186 mg/dL

## 2021-11-05 NOTE — Assessment & Plan Note (Signed)
Stable.  No complications.  No infection.  No hemoptysis.

## 2022-06-08 ENCOUNTER — Ambulatory Visit (INDEPENDENT_AMBULATORY_CARE_PROVIDER_SITE_OTHER): Payer: Medicaid Other

## 2022-06-08 ENCOUNTER — Encounter: Payer: Self-pay | Admitting: Emergency Medicine

## 2022-06-08 ENCOUNTER — Ambulatory Visit (INDEPENDENT_AMBULATORY_CARE_PROVIDER_SITE_OTHER): Payer: Medicaid Other | Admitting: Emergency Medicine

## 2022-06-08 VITALS — BP 106/66 | HR 73 | Temp 97.7°F | Ht 68.0 in | Wt 156.5 lb

## 2022-06-08 DIAGNOSIS — E1159 Type 2 diabetes mellitus with other circulatory complications: Secondary | ICD-10-CM

## 2022-06-08 DIAGNOSIS — G8929 Other chronic pain: Secondary | ICD-10-CM

## 2022-06-08 DIAGNOSIS — E1169 Type 2 diabetes mellitus with other specified complication: Secondary | ICD-10-CM | POA: Diagnosis not present

## 2022-06-08 DIAGNOSIS — I152 Hypertension secondary to endocrine disorders: Secondary | ICD-10-CM | POA: Diagnosis not present

## 2022-06-08 DIAGNOSIS — M25562 Pain in left knee: Secondary | ICD-10-CM

## 2022-06-08 DIAGNOSIS — R202 Paresthesia of skin: Secondary | ICD-10-CM

## 2022-06-08 DIAGNOSIS — J479 Bronchiectasis, uncomplicated: Secondary | ICD-10-CM | POA: Diagnosis not present

## 2022-06-08 DIAGNOSIS — E785 Hyperlipidemia, unspecified: Secondary | ICD-10-CM | POA: Diagnosis not present

## 2022-06-08 LAB — COMPREHENSIVE METABOLIC PANEL
ALT: 18 U/L (ref 0–35)
AST: 24 U/L (ref 0–37)
Albumin: 4.3 g/dL (ref 3.5–5.2)
Alkaline Phosphatase: 73 U/L (ref 39–117)
BUN: 13 mg/dL (ref 6–23)
CO2: 31 mEq/L (ref 19–32)
Calcium: 9.5 mg/dL (ref 8.4–10.5)
Chloride: 100 mEq/L (ref 96–112)
Creatinine, Ser: 0.85 mg/dL (ref 0.40–1.20)
GFR: 69.83 mL/min (ref >60.00)
Glucose, Bld: 121 mg/dL — ABNORMAL HIGH (ref 70–99)
Potassium: 3.8 mEq/L (ref 3.5–5.1)
Sodium: 141 mEq/L (ref 135–145)
Total Bilirubin: 0.3 mg/dL (ref 0.2–1.2)
Total Protein: 8.1 g/dL (ref 6.0–8.3)

## 2022-06-08 LAB — POCT GLYCOSYLATED HEMOGLOBIN (HGB A1C): Hemoglobin A1C: 7 % — AB (ref 4.0–5.6)

## 2022-06-08 LAB — LIPID PANEL
Cholesterol: 168 mg/dL (ref 0–200)
HDL: 52.1 mg/dL (ref >39.00)
LDL Cholesterol: 104 mg/dL — ABNORMAL HIGH (ref 0–99)
NonHDL: 115.51
Total CHOL/HDL Ratio: 3
Triglycerides: 58 mg/dL (ref 0.0–149.0)
VLDL: 11.6 mg/dL (ref 0.0–40.0)

## 2022-06-08 LAB — MICROALBUMIN / CREATININE URINE RATIO
Creatinine,U: 92.6 mg/dL
Microalb Creat Ratio: 0.8 mg/g (ref 0.0–30.0)
Microalb, Ur: <0.7 mg/dL (ref 0.0–1.9)

## 2022-06-08 MED ORDER — GLIMEPIRIDE 2 MG PO TABS: 2.0000 mg | ORAL_TABLET | Freq: Every day | ORAL | 1 refills | Status: DC

## 2022-06-08 MED ORDER — GABAPENTIN 300 MG PO CAPS: 300.0000 mg | ORAL_CAPSULE | Freq: Every day | ORAL | 1 refills | Status: DC

## 2022-06-08 MED ORDER — TRELEGY ELLIPTA 100-62.5-25 MCG/ACT IN AEPB: 1.0000 | INHALATION_SPRAY | Freq: Every day | RESPIRATORY_TRACT | 11 refills | Status: DC

## 2022-06-08 MED ORDER — ROSUVASTATIN CALCIUM 20 MG PO TABS
20.0000 mg | ORAL_TABLET | Freq: Every day | ORAL | 1 refills | Status: DC
Start: 2022-06-08 — End: 2023-02-17

## 2022-06-08 MED ORDER — AMLODIPINE BESYLATE 10 MG PO TABS: 10.0000 mg | ORAL_TABLET | Freq: Every day | ORAL | 1 refills | Status: DC

## 2022-06-08 MED ORDER — METFORMIN HCL ER 500 MG PO TB24: 500.0000 mg | ORAL_TABLET | Freq: Two times a day (BID) | ORAL | 1 refills | Status: DC

## 2022-06-08 NOTE — Progress Notes (Signed)
Cheryl Hopkins 70 y.o.   Chief Complaint  Patient presents with   Diabetes   Knee Pain    Left knee pain off and on for 5 months     HISTORY OF PRESENT ILLNESS: This is a 71 y.o. female here for 29-monthfollow-up of diabetes and hypertension and dyslipidemia. Also complaining of left knee pain for the past 5 months. No other complaints or medical concerns today. Lab Results  Component Value Date   HGBA1C 6.8 (A) 11/05/2021   BP Readings from Last 3 Encounters:  11/05/21 122/60  10/25/21 (!) 116/59  08/29/21 120/60   Wt Readings from Last 3 Encounters:  11/05/21 161 lb (73 kg)  08/29/21 159 lb (72.1 kg)  08/13/21 159 lb (72.1 kg)     Diabetes Pertinent negatives for hypoglycemia include no dizziness or headaches. Pertinent negatives for diabetes include no chest pain.  Knee Pain     Prior to Admission medications   Medication Sig Start Date End Date Taking? Authorizing Provider  ACCU-CHEK GUIDE test strip USE 1 STRIP ONCE DAILY 06/05/20   SHorald Pollen MD  Accu-Chek Softclix Lancets lancets SMARTSIG:1 Topical Daily 04/23/21   SHorald Pollen MD  amLODipine (NORVASC) 10 MG tablet Take 1 tablet (10 mg total) by mouth daily. 11/05/21 05/04/22  SHorald Pollen MD  Blood Glucose Monitoring Suppl w/Device KIT 1 Device by Does not apply route daily. 05/08/20   SHorald Pollen MD  gabapentin (NEURONTIN) 300 MG capsule Take 1 capsule (300 mg total) by mouth at bedtime. 05/20/21 11/05/21  SHorald Pollen MD  glimepiride (AMARYL) 2 MG tablet Take 1 tablet (2 mg total) by mouth daily with breakfast. TAKE ONE TABLET BY MOUTH WITH BREAKFAST 11/05/21 05/04/22  SHorald Pollen MD  metFORMIN (GLUCOPHAGE-XR) 500 MG 24 hr tablet Take 1 tablet (500 mg total) by mouth 2 (two) times daily with a meal. 11/05/21 05/04/22  SHorald Pollen MD  Multiple Vitamin (MULTIVITAMIN) tablet Take 1 tablet by mouth daily.    [provider]  rosuvastatin (CRESTOR) 20 MG  tablet Take 1 tablet (20 mg total) by mouth daily. 11/05/21 05/04/22  SHorald Pollen MD  sitaGLIPtin (JANUVIA) 50 MG tablet Take 1 tablet (50 mg total) by mouth daily. 11/05/21 05/04/22  SHorald Pollen MD  Spacer/Aero-Holding Chambers (AEROCHAMBER PLUS) inhaler Use as instructed Patient not taking: Reported on 08/13/2021 04/23/21   SHorald Pollen MD  Tiotropium Bromide Monohydrate (SPIRIVA RESPIMAT) 2.5 MCG/ACT AERS Inhale 2 puffs into the lungs daily. Patient not taking: Reported on 08/13/2021 06/19/20   RBrand Males MD    No Known Allergies  Patient Active Problem List   Diagnosis Date Noted   Bilateral leg pain 05/20/2021   Paresthesia of both lower extremities 05/20/2021   History of simple renal cyst 04/23/2021   Bron[place]ctasis without complication ([facility] 147/82/9562  Language barrier 08/14/2020   Mixed conductive and sensorineural hearing loss of left ear with restricted hearing of right ear 06/19/2020   Solitary pulmonary nodule 03/05/2020   Chronic pain of both knees 03/29/2019   Chronic lung disease 10/31/2015   Hypertension associated with diabetes ([facility] 10/31/2015   Dyslipidemia associated with type 2 diabetes mellitus ([facility] 09/30/2015   Hyperlipidemia 09/30/2015    Past Medical History:  Diagnosis Date   Arthritis    knee   Bronchitis    Diabetes mellitus without complication ([place]    Several years.   Hyperlipidemia 09/30/2015   Hypertension    Stomach pain  with diarrhea    Past Surgical History:  Procedure Laterality Date   COLONOSCOPY      Social History   Socioeconomic History   Marital status: Widowed    Spouse name: Widowed since 2010   Number of children: 9   Years of education: <8   Highest education level: Not on file  Occupational History   Occupation: Housewife  Tobacco Use   Smoking status: Never   Smokeless tobacco: Never   Tobacco comments:    Chews cola nut  Vaping Use   Vaping Use: Never used  Substance and  Sexual Activity   Alcohol use: No    Alcohol/week: 0.0 standard drinks of alcohol   Drug use: No   Sexual activity: Not Currently  Other Topics Concern   Not on file  Social History Narrative      Widow   Originally from [place] - Came to [place]. In 2016   Lives with son and his wife and their 3 children.        Exercise - no exercise      Seatbelt - 100%   Gun in home - no   Social Determinants of Health   Financial Resource Strain: Not on file  Food Insecurity: Not on file  Transportation Needs: Not on file  Physical Activity: Not on file  Stress: Not on file  Social Connections: Not on file  Intimate Partner Violence: Not on file    Family History  Problem Relation Age of Onset   Bronchitis Sister    Hypertension Sister    Stroke Sister    Hepatitis Daughter    Colon cancer Neg Hx    Esophageal cancer Neg Hx    Rectal cancer Neg Hx    Stomach cancer Neg Hx      Review of Systems  Constitutional: Negative.  Negative for chills and fever.  HENT: Negative.  Negative for congestion and sore throat.   Respiratory: Negative.  Negative for cough and shortness of breath.   Cardiovascular: Negative.  Negative for chest pain and palpitations.  Gastrointestinal:  Negative for abdominal pain, diarrhea, nausea and vomiting.  Genitourinary: Negative.   Skin: Negative.  Negative for rash.  Neurological:  Negative for dizziness and headaches.  Psychiatric/Behavioral:  The patient has insomnia (Occasional insomnia).   All other systems reviewed and are negative.   Today's Vitals   06/08/22 0947  BP: 106/66  Pulse: 73  Temp: 97.7 F (36.5 C)  TempSrc: Oral  SpO2: (!) 86%  Weight: 156 lb 8 oz (71 kg)  Height: _0  (1.727 m)   Body mass index is 23.8 kg/m.  Physical Exam Vitals reviewed.  Constitutional:      Appearance: Normal appearance.  HENT:     Head: Normocephalic.     Mouth/Throat:     Mouth: Mucous membranes are moist.     Pharynx: Oropharynx is clear.   Eyes:     Extraocular Movements: Extraocular movements intact.     Conjunctiva/sclera: Conjunctivae normal.     Pupils: Pupils are equal, round, and reactive to light.  Cardiovascular:     Rate and Rhythm: Normal rate and regular rhythm.     Pulses: Normal pulses.     Heart sounds: Normal heart sounds.  Pulmonary:     Effort: Pulmonary effort is normal.     Breath sounds: Normal breath sounds.  Abdominal:     Palpations: Abdomen is soft.     Tenderness: There is no abdominal tenderness.  Musculoskeletal:     Cervical back: No tenderness.     Comments: Left knee: No swelling or tenderness.  Full range of motion.  Unremarkable exam.  Lymphadenopathy:     Cervical: No cervical adenopathy.  Neurological:     General: No focal deficit present.     Mental Status: She is alert and oriented to person, place, and time.  Psychiatric:        Mood and Affect: Mood normal.        Behavior: Behavior normal.   Results for orders placed or performed in visit on 06/08/22 (from the past 24 hour(s))  POCT HgB A1C     Status: Abnormal   Collection Time: 06/08/22 10:08 AM  Result Value Ref Range   Hemoglobin A1C 7.0 (A) 4.0 - 5.6 %   HbA1c POC (<> result, manual entry)     HbA1c, POC (prediabetic range)     HbA1c, POC (controlled diabetic range)     DG Knee Complete 4 Views Left  Result Date: 06/08/2022 CLINICAL DATA:  Chronic left knee pain. EXAM: LEFT KNEE - COMPLETE 4+ VIEW COMPARISON:  None Available. FINDINGS: No evidence of fracture, dislocation, or joint effusion. Mild tricompartment narrowing is identified. Mild osteophytosis in the superior posterior aspect of the patella noted. Soft tissues are unremarkable. IMPRESSION: Mild degenerative joint changes of left knee. Electronically Signed   By: Abelardo Diesel M.D.   On: 06/08/2022 10:40    ASSESSMENT & PLAN: A total of 48 minutes was spent with the patient and counseling/coordination of care regarding preparing for this visit, review of  most recent office visit notes, review of most recent blood work results including today's hemoglobin A1c, review of all medications and changes made, review of multiple chronic medical problems and their management, education on nutrition, differential diagnosis of chronic left knee pain and need for orthopedic evaluation, review of x-rays, cardiovascular risks associated with diabetes and hypertension, prognosis, documentation and need for follow-up.  Problem List Items Addressed This Visit       Cardiovascular and Mediastinum   Hypertension associated with diabetes ([place]) - Primary    Well-controlled hypertension. Continue amlodipine 10 mg daily. Well-controlled diabetes with hemoglobin A1c of 7.0. Continue metformin 1000 mg daily, Amaryl 2 mg daily, and Januvia 50 mg daily. Cardiovascular risk associated with hypertension and diabetes discussed. Diet and nutrition discussed. Follow-up in 6 months.      Relevant Medications   amLODipine (NORVASC) 10 MG tablet   glimepiride (AMARYL) 2 MG tablet   metFORMIN (GLUCOPHAGE-XR) 500 MG 24 hr tablet   rosuvastatin (CRESTOR) 20 MG tablet   Other Relevant Orders   POCT HgB A1C (Completed)   Urine Microalbumin w/creat. ratio   Comprehensive metabolic panel     Respiratory   Bronchiectasis without complication (HCC)    Presently on no medication. Low O2 sat on examination. Asymptomatic now but has dyspnea on exertion occasionally Recommend to start either Trelegy or Breztri depending on coverage      Relevant Medications   Fluticasone-Umeclidin-Vilant (TRELEGY ELLIPTA) 100-62.5-25 MCG/ACT AEPB     Endocrine   Dyslipidemia associated with type 2 diabetes mellitus ([place])    Stable.  Diet and nutrition discussed. Continue rosuvastatin 20 mg. The 10-year ASCVD risk score (Arnett DK, et al., 2019) is: 11.1%   Values used to calculate the score:     Age: 57 years     Sex: Female     Is Non-Hispanic African American: No     Diabetic: Yes  Tobacco smoker: No     Systolic Blood Pressure: 734 mmHg     Is BP treated: No     HDL Cholesterol: 52.1 mg/dL     Total Cholesterol: 186 mg/dL       Relevant Medications   glimepiride (AMARYL) 2 MG tablet   metFORMIN (GLUCOPHAGE-XR) 500 MG 24 hr tablet   rosuvastatin (CRESTOR) 20 MG tablet   Other Relevant Orders   Lipid panel     Other   Chronic pain of left knee    X-ray done today. Unremarkable examination. Referral to sports medicine.      Relevant Medications   gabapentin (NEURONTIN) 300 MG capsule   Other Relevant Orders   DG Knee Complete 4 Views Left (Completed)   Ambulatory referral to Sports Medicine   Paresthesia of both lower extremities   Relevant Medications   gabapentin (NEURONTIN) 300 MG capsule   Patient Instructions  Diabetes Mellitus and Nutrition, Adult When you have diabetes, or diabetes mellitus, it is very important to have healthy eating habits because your blood sugar (glucose) levels are greatly affected by what you eat and drink. Eating healthy foods in the right amounts, at about the same times every day, can help you: Manage your blood glucose. Lower your risk of heart disease. Improve your blood pressure. Reach or maintain a healthy weight. What can affect my meal plan? Every person with diabetes is different, and each person has different needs for a meal plan. Your health care provider may recommend that you work with a dietitian to make a meal plan that is best for you. Your meal plan may vary depending on factors such as: The calories you need. The medicines you take. Your weight. Your blood glucose, blood pressure, and cholesterol levels. Your activity level. Other health conditions you have, such as heart or kidney disease. How do carbohydrates affect me? Carbohydrates, also called carbs, affect your blood glucose level more than any other type of food. Eating carbs raises the amount of glucose in your blood. It is important to  know how many carbs you can safely have in each meal. This is different for every person. Your dietitian can help you calculate how many carbs you should have at each meal and for each snack. How does alcohol affect me? Alcohol can cause a decrease in blood glucose (hypoglycemia), especially if you use insulin or take certain diabetes medicines by mouth. Hypoglycemia can be a life-threatening condition. Symptoms of hypoglycemia, such as sleepiness, dizziness, and confusion, are similar to symptoms of having too much alcohol. Do not drink alcohol if: Your health care provider tells you not to drink. You are pregnant, may be pregnant, or are planning to become pregnant. If you drink alcohol: Limit how much you have to: 0-1 drink a day for women. 0-2 drinks a day for men. Know how much alcohol is in your drink. In the [place] one drink equals one 12 oz bottle of beer (355 mL), one 5 oz glass of wine (148 mL), or one 1 oz glass of hard liquor (44 mL). Keep yourself hydrated with water, diet soda, or unsweetened iced tea. Keep in mind that regular soda, juice, and other mixers may contain a lot of sugar and must be counted as carbs. What are tips for following this plan?  Reading food labels Start by checking the serving size on the Nutrition Facts label of packaged foods and drinks. The number of calories and the amount of carbs, fats, and other nutrients  listed on the label are based on one serving of the item. Many items contain more than one serving per package. Check the total grams (g) of carbs in one serving. Check the number of grams of saturated fats and trans fats in one serving. Choose foods that have a low amount or none of these fats. Check the number of milligrams (mg) of salt (sodium) in one serving. Most people should limit total sodium intake to less than 2,300 mg per day. Always check the nutrition information of foods labeled as "low-fat" or "nonfat." These foods may be higher in  added sugar or refined carbs and should be avoided. Talk to your dietitian to identify your daily goals for nutrients listed on the label. Shopping Avoid buying canned, pre-made, or processed foods. These foods tend to be high in fat, sodium, and added sugar. Shop around the outside edge of the grocery store. This is where you will most often find fresh fruits and vegetables, bulk grains, fresh meats, and fresh dairy products. Cooking Use low-heat cooking methods, such as baking, instead of high-heat cooking methods, such as deep frying. Cook using healthy oils, such as olive, canola, or sunflower oil. Avoid cooking with butter, cream, or high-fat meats. Meal planning Eat meals and snacks regularly, preferably at the same times every day. Avoid going long periods of time without eating. Eat foods that are high in fiber, such as fresh fruits, vegetables, beans, and whole grains. Eat 4-6 oz (112-168 g) of lean protein each day, such as lean meat, chicken, fish, eggs, or tofu. One ounce (oz) (28 g) of lean protein is equal to: 1 oz (28 g) of meat, chicken, or fish. 1 egg.  cup (62 g) of tofu. Eat some foods each day that contain healthy fats, such as avocado, nuts, seeds, and fish. What foods should I eat? Fruits Berries. Apples. Oranges. Peaches. Apricots. Plums. Grapes. Mangoes. Papayas. Pomegranates. Kiwi. Cherries. Vegetables Leafy greens, including lettuce, spinach, kale, chard, collard greens, mustard greens, and cabbage. Beets. Cauliflower. Broccoli. Carrots. Green beans. Tomatoes. Peppers. Onions. Cucumbers. Brussels sprouts. Grains Whole grains, such as whole-wheat or whole-grain bread, crackers, tortillas, cereal, and pasta. Unsweetened oatmeal. Quinoa. Brown or wild rice. Meats and other proteins Seafood. Poultry without skin. Lean cuts of poultry and beef. Tofu. Nuts. Seeds. Dairy Low-fat or fat-free dairy products such as milk, yogurt, and cheese. The items listed above may not  be a complete list of foods and beverages you can eat and drink. Contact a dietitian for more information. What foods should I avoid? Fruits Fruits canned with syrup. Vegetables Canned vegetables. Frozen vegetables with butter or cream sauce. Grains Refined white flour and flour products such as bread, pasta, snack foods, and cereals. Avoid all processed foods. Meats and other proteins Fatty cuts of meat. Poultry with skin. Breaded or fried meats. Processed meat. Avoid saturated fats. Dairy Full-fat yogurt, cheese, or milk. Beverages Sweetened drinks, such as soda or iced tea. The items listed above may not be a complete list of foods and beverages you should avoid. Contact a dietitian for more information. Questions to ask a health care provider Do I need to meet with a certified diabetes care and education specialist? Do I need to meet with a dietitian? What number can I call if I have questions? When are the best times to check my blood glucose? Where to find more information: American Diabetes Association: diabetes.org Academy of Nutrition and Dietetics: eatright.[employer] of Diabetes and Digestive and Kidney Diseases:  Amen[employer].is Association of Diabetes Care & Education Specialists: diabeteseducator.org Summary It is important to have healthy eating habits because your blood sugar (glucose) levels are greatly affected by what you eat and drink. It is important to use alcohol carefully. A healthy meal plan will help you manage your blood glucose and lower your risk of heart disease. Your health care provider may recommend that you work with a dietitian to make a meal plan that is best for you. This information is not intended to replace advice given to you by your health care provider. Make sure you discuss any questions you have with your health care provider. Document Revised: 05/22/2020 Document Reviewed: 05/22/2020 Elsevier Patient Education  [place], MD [facility]

## 2022-06-08 NOTE — Assessment & Plan Note (Signed)
Presently on no medication. Low O2 sat on examination. Asymptomatic now but has dyspnea on exertion occasionally Recommend to start either Trelegy or Breztri depending on coverage

## 2022-06-08 NOTE — Assessment & Plan Note (Signed)
Well-controlled hypertension. Continue amlodipine 10 mg daily. Well-controlled diabetes with hemoglobin A1c of 7.0. Continue metformin 1000 mg daily, Amaryl 2 mg daily, and Januvia 50 mg daily. Cardiovascular risk associated with hypertension and diabetes discussed. Diet and nutrition discussed. Follow-up in 6 months.

## 2022-06-08 NOTE — Assessment & Plan Note (Signed)
Stable.  Diet and nutrition discussed. Continue rosuvastatin 20 mg. The 10-year ASCVD risk score (Arnett DK, et al., 2019) is: 11.1%   Values used to calculate the score:     Age: 71 years     Sex: Female     Is Non-Hispanic African American: No     Diabetic: Yes     Tobacco smoker: No     Systolic Blood Pressure: 254 mmHg     Is BP treated: No     HDL Cholesterol: 52.1 mg/dL     Total Cholesterol: 186 mg/dL

## 2022-06-08 NOTE — Assessment & Plan Note (Signed)
X-ray done today. Unremarkable examination. Referral to sports medicine.

## 2022-06-08 NOTE — Patient Instructions (Signed)

## 2022-06-22 ENCOUNTER — Telehealth: Payer: Self-pay

## 2022-06-22 NOTE — Telephone Encounter (Signed)
Pt son is calling stating that she needs a PA for Fluticasone-Umeclidin-Vilant (TRELEGY ELLIPTA) 100-62.5-25 MCG/ACT AEPB.  Pt son is requesting another medication that would be less cost for them to purchase.  Pt is requesting OTC sleep aid options as well.  Please advise

## 2022-06-24 NOTE — Telephone Encounter (Signed)
Called patient son in reference to PA, will start PA for Trelegy

## 2022-06-24 NOTE — Telephone Encounter (Signed)
PA for Trelegy submitted, awaiting response Key:  [id number]

## 2022-06-25 ENCOUNTER — Other Ambulatory Visit: Payer: Self-pay | Admitting: Emergency Medicine

## 2022-06-25 MED ORDER — BUDESONIDE-FORMOTEROL FUMARATE 80-4.5 MCG/ACT IN AERO: 2.0000 | INHALATION_SPRAY | Freq: Two times a day (BID) | RESPIRATORY_TRACT | 3 refills | Status: DC

## 2022-06-25 NOTE — Telephone Encounter (Signed)
Called and left message for patient son in reference to new rx being sent to pharmacy

## 2022-06-25 NOTE — Telephone Encounter (Signed)
New prescription for Symbicort sent to pharmacy of record.  Thanks.

## 2022-06-25 NOTE — Telephone Encounter (Signed)
PA denied for Trelegy,  Alternative meds that are covered:   Advair Diskus, Advair HFA inhaler, Duler Inhaler Symbicort inhaler. Please advise

## 2022-07-01 NOTE — Progress Notes (Unsigned)
Cheryl Hopkins D.[place] Phone: [phone]   Assessment and Plan:     There are no diagnoses linked to this encounter.  ***   Pertinent previous records reviewed include ***   Follow Up: ***     Subjective:   I, Cheryl Hopkins, am serving as a Education administrator for Doctor Cheryl Hopkins  Chief Complaint: left knee pain   HPI:   07/02/2022 Patient is a 71 year old female complaining of left knee pain. Patient states  Relevant Historical Information: ***  Additional pertinent review of systems negative.   Current Outpatient Medications:    ACCU-CHEK GUIDE test strip, USE 1 STRIP ONCE DAILY, Disp: 100 each, Rfl: 7   Accu-Chek Softclix Lancets lancets, SMARTSIG:1 Topical Daily, Disp: 100 each, Rfl: 7   amLODipine (NORVASC) 10 MG tablet, Take 1 tablet (10 mg total) by mouth daily., Disp: 180 tablet, Rfl: 1   Blood Glucose Monitoring Suppl w/Device KIT, 1 Device by Does not apply route daily., Disp: 1 kit, Rfl: 0   budesonide-formoterol (SYMBICORT) 80-4.5 MCG/ACT inhaler, Inhale 2 puffs into the lungs 2 (two) times daily., Disp: 1 each, Rfl: 3   gabapentin (NEURONTIN) 300 MG capsule, Take 1 capsule (300 mg total) by mouth at bedtime., Disp: 90 capsule, Rfl: 1   glimepiride (AMARYL) 2 MG tablet, Take 1 tablet (2 mg total) by mouth daily with breakfast. TAKE ONE TABLET BY MOUTH WITH BREAKFAST, Disp: 180 tablet, Rfl: 1   metFORMIN (GLUCOPHAGE-XR) 500 MG 24 hr tablet, Take 1 tablet (500 mg total) by mouth 2 (two) times daily with a meal., Disp: 360 tablet, Rfl: 1   Multiple Vitamin (MULTIVITAMIN) tablet, Take 1 tablet by mouth daily., Disp: , Rfl:    rosuvastatin (CRESTOR) 20 MG tablet, Take 1 tablet (20 mg total) by mouth daily., Disp: 180 tablet, Rfl: 1   sitaGLIPtin (JANUVIA) 50 MG tablet, Take 1 tablet (50 mg total) by mouth daily., Disp: 180 tablet, Rfl: 1   Spacer/Aero-Holding Chambers (AEROCHAMBER PLUS)  inhaler, Use as instructed (Patient not taking: Reported on 08/13/2021), Disp: 1 each, Rfl: 2  Current Facility-Administered Medications:    0.9 %  sodium chloride infusion, 500 mL, Intravenous, Once, Danis, Kirke Corin, MD   Objective:     There were no vitals filed for this visit.    There is no height or weight on file to calculate BMI.    Physical Exam:    ***   Electronically signed by:  Cheryl Hopkins D.[facility] 7:49 AM 07/01/22

## 2022-07-02 ENCOUNTER — Ambulatory Visit (INDEPENDENT_AMBULATORY_CARE_PROVIDER_SITE_OTHER): Payer: Medicaid Other | Admitting: Sports Medicine

## 2022-07-02 VITALS — BP 114/60 | HR 78 | Ht 68.0 in | Wt 160.0 lb

## 2022-07-02 DIAGNOSIS — M1712 Unilateral primary osteoarthritis, left knee: Secondary | ICD-10-CM | POA: Diagnosis not present

## 2022-07-02 DIAGNOSIS — M25562 Pain in left knee: Secondary | ICD-10-CM | POA: Diagnosis not present

## 2022-07-02 DIAGNOSIS — G8929 Other chronic pain: Secondary | ICD-10-CM

## 2022-07-02 NOTE — Patient Instructions (Signed)
Do prescribed exercises at least 3x a week As needed follow up

## 2022-08-20 ENCOUNTER — Telehealth: Payer: Self-pay

## 2022-08-20 NOTE — Telephone Encounter (Signed)
Patient's son wanted to let Dr.Sagardia know that his mom is currently admitted to the hospital in [place] due to falling and breaking both hips. Son is going to try to have the records sent to [place] for review.

## 2022-08-20 NOTE — Telephone Encounter (Signed)
Thanks

## 2022-10-05 NOTE — Progress Notes (Unsigned)
Cheryl Hopkins Cheryl Hopkins Phone: [phone]   Assessment and Plan:     There are no diagnoses linked to this encounter.  ***   Pertinent previous records reviewed include ***   Follow Up: ***     Subjective:   I, Katerine Morua, am serving as a Education administrator for Doctor Glennon Mac  Chief Complaint: left hip pain   HPI:   10/06/2022 Patient is a 71 year old female complaining of left hip pain. Patient states  Relevant Historical Information: ***  Additional pertinent review of systems negative.   Current Outpatient Medications:    ACCU-CHEK GUIDE test strip, USE 1 STRIP ONCE DAILY, Disp: 100 each, Rfl: 7   Accu-Chek Softclix Lancets lancets, SMARTSIG:1 Topical Daily, Disp: 100 each, Rfl: 7   amLODipine (NORVASC) 10 MG tablet, Take 1 tablet (10 mg total) by mouth daily., Disp: 180 tablet, Rfl: 1   Blood Glucose Monitoring Suppl w/Device KIT, 1 Device by Does not apply route daily., Disp: 1 kit, Rfl: 0   budesonide-formoterol (SYMBICORT) 80-4.5 MCG/ACT inhaler, Inhale 2 puffs into the lungs 2 (two) times daily., Disp: 1 each, Rfl: 3   gabapentin (NEURONTIN) 300 MG capsule, Take 1 capsule (300 mg total) by mouth at bedtime., Disp: 90 capsule, Rfl: 1   glimepiride (AMARYL) 2 MG tablet, Take 1 tablet (2 mg total) by mouth daily with breakfast. TAKE ONE TABLET BY MOUTH WITH BREAKFAST, Disp: 180 tablet, Rfl: 1   metFORMIN (GLUCOPHAGE-XR) 500 MG 24 hr tablet, Take 1 tablet (500 mg total) by mouth 2 (two) times daily with a meal., Disp: 360 tablet, Rfl: 1   Multiple Vitamin (MULTIVITAMIN) tablet, Take 1 tablet by mouth daily., Disp: , Rfl:    rosuvastatin (CRESTOR) 20 MG tablet, Take 1 tablet (20 mg total) by mouth daily., Disp: 180 tablet, Rfl: 1   sitaGLIPtin (JANUVIA) 50 MG tablet, Take 1 tablet (50 mg total) by mouth daily., Disp: 180 tablet, Rfl: 1   Spacer/Aero-Holding Chambers (AEROCHAMBER PLUS)  inhaler, Use as instructed (Patient not taking: Reported on 08/13/2021), Disp: 1 each, Rfl: 2  Current Facility-Administered Medications:    0.9 %  sodium chloride infusion, 500 mL, Intravenous, Once, Danis, Kirke Corin, MD   Objective:     There were no vitals filed for this visit.    There is no height or weight on file to calculate BMI.    Physical Exam:    ***   Electronically signed by:  Cheryl Hopkins D.[facility] 12:15 PM 10/05/22

## 2022-10-06 ENCOUNTER — Ambulatory Visit (INDEPENDENT_AMBULATORY_CARE_PROVIDER_SITE_OTHER): Payer: Medicaid Other | Admitting: Sports Medicine

## 2022-10-06 ENCOUNTER — Ambulatory Visit (INDEPENDENT_AMBULATORY_CARE_PROVIDER_SITE_OTHER): Payer: Medicaid Other

## 2022-10-06 VITALS — BP 110/80 | HR 84 | Ht 68.0 in | Wt 160.0 lb

## 2022-10-06 DIAGNOSIS — S32592A Other specified fracture of left pubis, initial encounter for closed fracture: Secondary | ICD-10-CM | POA: Diagnosis not present

## 2022-10-06 DIAGNOSIS — M25552 Pain in left hip: Secondary | ICD-10-CM

## 2022-10-06 NOTE — Patient Instructions (Addendum)
Good to see you  Pt referral  Tylenol 500 mg as needed for pain relief  As needed follow up

## 2022-10-12 ENCOUNTER — Telehealth: Payer: Self-pay | Admitting: Sports Medicine

## 2022-10-12 NOTE — Telephone Encounter (Signed)
Patient's son came by the office asking if Dr Glennon Mac would be able to complete a handicap placard form for her?  Please advise. Would like mailed to the patient's address.

## 2022-10-21 NOTE — Therapy (Signed)
OUTPATIENT PHYSICAL THERAPY LOWER EXTREMITY EVALUATION   Patient Name: Cheryl Hopkins MRN: [id number] DOB:11/11/1950, 71 y.o., female Today's Date: 10/22/2022  END OF SESSION:  PT End of Session - 10/22/22 1810     Visit Number 1    Number of Visits 7    Date for PT Re-Evaluation 12/11/22    Authorization Type [place] MEDICAID [employer]    PT Start Time 1150    PT Stop Time 1235    PT Time Calculation (min) 45 min    Activity Tolerance Patient tolerated treatment well    Behavior During Therapy WFL for tasks assessed/performed             Past Medical History:  Diagnosis Date   Arthritis    knee   Bronchitis    Diabetes mellitus without complication ([place])    Several years.   Hyperlipidemia 09/30/2015   Hypertension    Stomach pain    with diarrhea   Past Surgical History:  Procedure Laterality Date   COLONOSCOPY     Patient Active Problem List   Diagnosis Date Noted   Bilateral leg pain 05/20/2021   Paresthesia of both lower extremities 05/20/2021   History of simple renal cyst 04/23/2021   Bronchiectasis without complication ([place]) 35/70/1779   Language barrier 08/14/2020   Mixed conductive and sensorineural hearing loss of left ear with restricted hearing of right ear 06/19/2020   Solitary pulmonary nodule 03/05/2020   Chronic pain of left knee 03/29/2019   Chronic lung disease 10/31/2015   Hypertension associated with diabetes ([place]) 10/31/2015   Dyslipidemia associated with type 2 diabetes mellitus ([place]) 09/30/2015   Hyperlipidemia 09/30/2015    PCP: Horald Pollen, MD  REFERRING PROVIDER: Glennon Mac, DO  REFERRING DIAG: [phone] (ICD-10-CM) - Left hip pain   THERAPY DIAG:  Pain in left hip  Muscle weakness (generalized)  Difficulty in walking, not elsewhere classified  Rationale for Evaluation and Treatment: Rehabilitation  ONSET DATE: 3 months ago  SUBJECTIVE:   SUBJECTIVE STATEMENT: Pt reports she fell and fractured her pelvic  when stepped on a toy causing her to fall. Pt notes that the pain is getting better. Pt reports she is using a SPC for support when walking outside her home, but does not in her home. Pt lives with her son's family.  PERTINENT HISTORY: DM s complications PAIN:  Are you having pain? Yes: NPRS scale: 0/10 Pain location: gron and buttock Pain description: ache, sharp Aggravating factors: Extended walking, certain movements Relieving factors: rest Pain scale on eval 0-3  PRECAUTIONS: None  WEIGHT BEARING RESTRICTIONS: No  FALLS:  Has patient fallen in last 6 months? Yes. Number of falls 1 Fell when she stepped on a toy  LIVING ENVIRONMENT: Lives with: lives with their family Lives in: House/apartment Stairs: Yes: External: 3 steps; none Has following equipment at home: Single point cane  OCCUPATION: Retired  PLOF: Independent  PATIENT GOALS: Walk and get around better, to not use the [facility]  NEXT MD VISIT:   OBJECTIVE:   DIAGNOSTIC FINDINGS:  L hip Xray: 10/07/22 IMPRESSION: Partial healing of mildly displaced fractures of the left superior and inferior pubic rami.  PATIENT SURVEYS:  NA  COGNITION: Overall cognitive status: Within functional limits for tasks assessed     SENSATION: WFL  EDEMA:  NT  MUSCLE LENGTH: Hamstrings: Right NT deg; Left NT deg Marcello Moores test: Right NT deg; Left NT deg  PALPATION: TTP ant hip and gluteal area  LOWER EXTREMITY ROM: L LE  AROM is WNLs and equal to the R Active ROM Right eval Left eval  Hip flexion    Hip extension    Hip abduction    Hip adduction    Hip internal rotation    Hip external rotation    Knee flexion    Knee extension    Ankle dorsiflexion    Ankle plantarflexion    Ankle inversion    Ankle eversion     (Blank rows = not tested)  LOWER EXTREMITY MMT:  MMT Right eval Left eval  Hip flexion 4+ 4  Hip extension 4 4  Hip abduction 4+ 4  Hip adduction 4+ 4  Hip internal rotation 4+ 4  Hip  external rotation 4+ 4  Knee flexion 5 4+  Knee extension 5 4+  Ankle dorsiflexion    Ankle plantarflexion    Ankle inversion    Ankle eversion     (Blank rows = not tested)  LOWER EXTREMITY SPECIAL TESTS:  NT  FUNCTIONAL TESTS:  5 times sit to stand: 20.6 c use of armrests 2 minute walk test: 370 s AD  GAIT: Distance walked: 393f Assistive device utilized: Single point cane Level of assistance: Modified independence Comments: decreased pace and step length   TODAY'S TREATMENT:                                                                                                                               [facility] Adult PT Treatment:                                                DATE: 10/22/22 Therapeutic Exercise: Developed, instructed in, and pt completed therex as noted in HEP  PATIENT EDUCATION:  Education details: Eval findings, POC, HEP Person educated: Patient and DIL Education method: Explanation, Demonstration, Tactile cues, Verbal cues, and Handouts Education comprehension: verbalized understanding, returned demonstration, verbal cues required, and tactile cues required  HOME EXERCISE PROGRAM: Access Code: [email or web address] URL: [email or web address] Date: 10/22/2022 Prepared by: AGar Ponto Exercises - Supine Cervical Retraction with Towel  - 1 x daily - 7 x weekly - 1 sets - 5 reps - 5 hold - Supine DNF Liftoffs  - 1 x daily - 7 x weekly - 1 sets - 5 reps - 5 hold - Seated Passive Cervical Retraction  - 6 x daily - 7 x weekly - 1 sets - 3 reps - 5 hold - Seated Upper Trapezius Stretch  - 6 x daily - 7 x weekly - 1 sets - 3 reps - 15 hold - Standing Cervical Rotation AROM with Overpressure  - 6 x daily - 7 x weekly - 1 sets - 3 reps - 15 hold - Shoulder External Rotation and Scapular Retraction with Resistance  - 1 x daily - 7 x weekly -  2 sets - 10 reps - 3 hold  ASSESSMENT:  CLINICAL IMPRESSION: Patient is a 71 y.o. female who was seen today for  physical therapy evaluation and treatment for M25.552 (ICD-10-CM) - Left hip pain . Pt presents with L hip pain which is improving, decreased L hip and knee strength, and decreased functional mobility.   OBJECTIVE IMPAIRMENTS: decreased activity tolerance, decreased balance, difficulty walking, decreased strength, and pain.   ACTIVITY LIMITATIONS: carrying, lifting, bending, squatting, stairs, and locomotion level  PARTICIPATION LIMITATIONS: meal prep, cleaning, laundry, and community activity  PERSONAL FACTORS: Fitness, Past/current experiences, and Time since onset of injury/illness/exacerbation are also affecting patient's functional outcome.   REHAB POTENTIAL: Excellent  CLINICAL DECISION MAKING: Stable/uncomplicated  EVALUATION COMPLEXITY: Low   GOALS:  SHORT TERM GOALS: Target date: 11/12/21 Pt will be Ind in an initial HEP Baseline:initiated Goal status: INITIAL  LONG TERM GOALS: Target date: 11/10/22  Pt will be Ind in a final HEP to maintain achieved LOF Baseline: initiated Goal status: INITIAL  2.  Increase L hip strength to 4+ or greater and L knee strength to 5/5 for improved functional mobility Baseline: see flow sheets Goal status: INITIAL  3.  Pt will progress in [facility] for [facility] mobility Baseline: NT Goal status: INITIAL  4.  Pt will be ind c stair negotiation of 12 steps c handrail for community mobility Baseline: NT Goal status: INITIAL  5.  Improve 5xSTS by MCID of 5" and 2MWT by MCID of 579fas indication of improved functional mobility  Baseline: see flow sheets Goal status: INITIAL  PLAN:  PT FREQUENCY: 1x/week  PT DURATION: 6 weeks  PLANNED INTERVENTIONS: Therapeutic exercises, Therapeutic activity, Balance training, Gait training, Patient/Family education, Self Care, Dry Needling, Electrical stimulation, Cryotherapy, Moist heat, Taping, Ultrasound, Ionotophoresis '4mg'$ /ml Dexamethasone, Manual therapy, and  Re-evaluation  PLAN FOR NEXT SESSION: Assess response to HEP; progress therex as indicated; use of modalities, manual therapy; and TPDN as indicated.    Lucyann Romano MS, PT 10/22/22 7:01 PM  [employer] Authorization   Choose one: Rehabilitative  Standardized Assessment or Functional Outcome Tool: See Pain Assessment, 5x sit to stand, and Other 2 min walking test  Score or Percent Disability: 5xSTS score of 20.6 sec is 9.2 sec higher than the age standard of 11.4 sec, and the 2 minute walking test score of 3709fs [id number] than the age standard of 509ft29fody Parts Treated (Select each separately):  Other L Pelvis and hip . Overall deficits/functional limitations for body part selected: moderate  If treatment provided at initial evaluation, no treatment charged due to lack of authorization.

## 2022-10-22 ENCOUNTER — Ambulatory Visit: Payer: Medicaid Other | Attending: Sports Medicine

## 2022-10-22 ENCOUNTER — Other Ambulatory Visit: Payer: Self-pay

## 2022-10-22 DIAGNOSIS — M6281 Muscle weakness (generalized): Secondary | ICD-10-CM | POA: Diagnosis present

## 2022-10-22 DIAGNOSIS — R262 Difficulty in walking, not elsewhere classified: Secondary | ICD-10-CM | POA: Diagnosis present

## 2022-10-22 DIAGNOSIS — M25552 Pain in left hip: Secondary | ICD-10-CM | POA: Diagnosis present

## 2022-11-03 ENCOUNTER — Ambulatory Visit: Payer: Medicaid Other

## 2022-11-05 ENCOUNTER — Encounter: Payer: Self-pay | Admitting: Physical Therapy

## 2022-11-05 ENCOUNTER — Ambulatory Visit: Payer: Medicaid Other | Attending: Sports Medicine | Admitting: Physical Therapy

## 2022-11-05 DIAGNOSIS — M6281 Muscle weakness (generalized): Secondary | ICD-10-CM | POA: Diagnosis present

## 2022-11-05 DIAGNOSIS — R262 Difficulty in walking, not elsewhere classified: Secondary | ICD-10-CM | POA: Insufficient documentation

## 2022-11-05 DIAGNOSIS — M25552 Pain in left hip: Secondary | ICD-10-CM | POA: Diagnosis present

## 2022-11-05 NOTE — Therapy (Signed)
OUTPATIENT PHYSICAL THERAPY TREATMENT NOTE   Patient Name: Cheryl Hopkins MRN: [id number] DOB:04-09-1951, 72 y.o., female Today's Date: 11/05/2022  PCP: Horald Pollen, MD   REFERRING PROVIDER: Glennon Mac, DO   REFERRING DIAG: [phone] (ICD-10-CM) - Left hip pain   END OF SESSION:   PT End of Session - 11/05/22 1145     Visit Number 2    Number of Visits 7    Date for PT Re-Evaluation 12/11/22    Authorization Type [place] MEDICAID [facility]    Authorization Time Period 12/22-2/20/24    Authorization - Visit Number 1    Authorization - Number of Visits 10    PT Start Time 1150    PT Stop Time 1230    PT Time Calculation (min) 40 min             Past Medical History:  Diagnosis Date   Arthritis    knee   Bronchitis    Diabetes mellitus without complication ([place])    Several years.   Hyperlipidemia 09/30/2015   Hypertension    Stomach pain    with diarrhea   Past Surgical History:  Procedure Laterality Date   COLONOSCOPY     Patient Active Problem List   Diagnosis Date Noted   Bilateral leg pain 05/20/2021   Paresthesia of both lower extremities 05/20/2021   History of simple renal cyst 04/23/2021   Bronchiectasis without complication ([place]) 90/24/0973   Language barrier 08/14/2020   Mixed conductive and sensorineural hearing loss of left ear with restricted hearing of right ear 06/19/2020   Solitary pulmonary nodule 03/05/2020   Chronic pain of left knee 03/29/2019   Chronic lung disease 10/31/2015   Hypertension associated with diabetes ([place]) 10/31/2015   Dyslipidemia associated with type 2 diabetes mellitus ([place]) 09/30/2015   Hyperlipidemia 09/30/2015      THERAPY DIAG:  Pain in left hip  Muscle weakness (generalized)  Difficulty in walking, not elsewhere classified  Rationale for Evaluation and Treatment Rehabilitation  PERTINENT HISTORY: DM s complications   PAIN:  Are you having pain? Yes: NPRS scale: 0/10 Pain location: gron and  buttock Pain description: ache, sharp Aggravating factors: Extended walking, certain movements Relieving factors: rest Pain scale on eval 0-3   PRECAUTIONS: None  SUBJECTIVE:                                                                                                                                                                                      SUBJECTIVE STATEMENT:  No pain right now. I am doing the exercises. Any time I exercise I feel bettr   OBJECTIVE: (objective measures completed at initial evaluation unless  otherwise dated)   DIAGNOSTIC FINDINGS:  L hip Xray: 10/07/22 IMPRESSION: Partial healing of mildly displaced fractures of the left superior and inferior pubic rami.   PATIENT SURVEYS:  NA   COGNITION: Overall cognitive status: Within functional limits for tasks assessed                         SENSATION: WFL   EDEMA:  NT   MUSCLE LENGTH: Hamstrings: Right NT deg; Left NT deg Marcello Moores test: Right NT deg; Left NT deg   PALPATION: TTP ant hip and gluteal area   LOWER EXTREMITY ROM: L LE AROM is WNLs and equal to the R Active ROM Right eval Left eval  Hip flexion      Hip extension      Hip abduction      Hip adduction      Hip internal rotation      Hip external rotation      Knee flexion      Knee extension      Ankle dorsiflexion      Ankle plantarflexion      Ankle inversion      Ankle eversion       (Blank rows = not tested)   LOWER EXTREMITY MMT:   MMT Right eval Left eval  Hip flexion 4+ 4  Hip extension 4 4  Hip abduction 4+ 4  Hip adduction 4+ 4  Hip internal rotation 4+ 4  Hip external rotation 4+ 4  Knee flexion 5 4+  Knee extension 5 4+  Ankle dorsiflexion      Ankle plantarflexion      Ankle inversion      Ankle eversion       (Blank rows = not tested)   LOWER EXTREMITY SPECIAL TESTS:  NT   FUNCTIONAL TESTS:  5 times sit to stand: 20.6 c use of armrests 2 minute walk test: 370 s AD   GAIT: Distance  walked: 321f Assistive device utilized: Single point cane Level of assistance: Modified independence Comments: decreased pace and step length     TODAY'S TREATMENT:  [facility] Adult PT Treatment:                                                DATE: 11/05/22 Therapeutic Exercise: Bridge x 15 SLR 10 x 3 each  Ball squeeze Supine clam STS x 10 Partial lunge using counter support x5 each with cues for step length and alignment.   Therapeutic Activity: Kneeling to floor for prayer and then rise using chair support x 2 , airex pad under knees                                                                                                                              [facility]ult [facility] Treatment:                                                DATE: 10/22/22 Therapeutic Exercise: Developed, instructed in, and pt completed therex as noted in HEP   PATIENT EDUCATION:  Education details: Eval findings, POC, HEP Person educated: Patient and DIL Education method: Explanation, Demonstration, Tactile cues, Verbal cues, and Handouts Education comprehension: verbalized understanding, returned demonstration, verbal cues required, and tactile cues required   HOME EXERCISE PROGRAM:   Access Code: [email or web address] URL: [email or web address] Date: 11/05/2022 Prepared by: Hessie Diener  Exercises - Supine Bridge  - 1-2 x daily - 7 x weekly - 1 sets - 10-15 reps - 5 hold - Supine Hip Adduction Isometric with Ball  - 1-2 x daily - 7 x weekly - 1 sets - 10-15 reps - 5 hold - Hooklying Clamshell with Resistance  - 1-2 x daily - 7 x weekly - 1 sets - 10-15 reps - 3 hold - Supine Straight Leg Raises  - 1-2 x daily - 7 x weekly - 3 sets - 10-15 reps - 3 hold - Sit to Stand with Counter Support  - 1-2 x daily - 7 x weekly - 3 sets - 10-15 reps Added 11/05/22 Partial lunge with chair support 2 sets of 5 reps   ASSESSMENT:   CLINICAL IMPRESSION: Patient is a 72 y.o. female who was seen today for physical  therapy treatment for M25.552 (ICD-10-CM) - Left hip pain . Pt presents without pain and reports compliance with HEP. She is accompanied by daughter-in-law who assists her with her exercises. She complains of difficulty getting down to the floor for prayer and has been staying in her chair to perform prayer pose. Demonstrated options for kneeling to floor and rising back up with use of chair assist and a foam pad. Pt was able to kneel to floor without hands and rise with use of UE. She reported improvement with this option. She was instructed in sit-stands and partial lunges to promote and retain strength for kneeling to pray. Reviewed HEP with pt reporting no pain and only fatigue with Left SLR. At this time she has no pain with stairs and only has 2 to enter home. Will continue to assess functional and subjective deficits to achieve patient stated goals.    OBJECTIVE IMPAIRMENTS: decreased activity tolerance, decreased balance, difficulty walking, decreased strength, and pain.    ACTIVITY LIMITATIONS: carrying, lifting, bending, squatting, stairs, and locomotion level   PARTICIPATION LIMITATIONS: meal prep, cleaning, laundry, and community activity   PERSONAL FACTORS: Fitness, Past/current experiences, and Time since onset of injury/illness/exacerbation are also affecting patient's functional outcome.    REHAB POTENTIAL: Excellent   CLINICAL DECISION MAKING: Stable/uncomplicated   EVALUATION COMPLEXITY: Low     GOALS:   SHORT TERM GOALS: Target date: 11/12/21 Pt will be Ind in an initial HEP Baseline:initiated Goal status: MET    LONG TERM GOALS: Target date: 11/10/22   Pt will be Ind in a final HEP to maintain achieved LOF Baseline: initiated Goal status: INITIAL   2.  Increase L hip strength to 4+ or greater and L knee strength to 5/5 for improved functional mobility Baseline: see flow sheets Goal status: INITIAL   3.  Pt [facility]  [facility] Baseline: NT Goal  status: INITIAL   4.  Pt will be ind c stair negotiation of 12 steps c handrail for community mobility Baseline: NT Goal status: INITIAL   5.  Improve 5xSTS by MCID of 5" and 2MWT by MCID of 31f as indication of improved functional mobility  Baseline: see flow sheets Goal status: INITIAL   PLAN:   PT FREQUENCY: 1x/week   PT DURATION: 6 weeks   PLANNED INTERVENTIONS: Therapeutic exercises, Therapeutic activity, Balance training, Gait training, Patient/Family education, Self Care, Dry Needling, Electrical stimulation, Cryotherapy, Moist heat, Taping, Ultrasound, Ionotophoresis 471mml Dexamethasone, Manual therapy, and Re-evaluation   PLAN FOR NEXT SESSION: Assess response to HEP; progress therex as indicated; use of modalities, manual therapy; and TPDN as indicated.      JeHessie DienerPTA 11/05/22 12:57 PM Phone: [phone]: [phone]

## 2022-11-11 ENCOUNTER — Ambulatory Visit: Payer: Medicaid Other

## 2022-11-11 DIAGNOSIS — M25552 Pain in left hip: Secondary | ICD-10-CM

## 2022-11-11 DIAGNOSIS — M6281 Muscle weakness (generalized): Secondary | ICD-10-CM

## 2022-11-11 DIAGNOSIS — R262 Difficulty in walking, not elsewhere classified: Secondary | ICD-10-CM

## 2022-11-11 NOTE — Therapy (Signed)
OUTPATIENT PHYSICAL THERAPY TREATMENT NOTE   Patient Name: Cheryl Hopkins MRN: [id number] DOB:10-27-1951, 72 y.o., female Today's Date: 11/11/2022  PCP: Horald Pollen, MD   REFERRING PROVIDER: Glennon Mac, DO   REFERRING DIAG: [phone] (ICD-10-CM) - Left hip pain   END OF SESSION:   PT End of Session - 11/11/22 1151     Visit Number 3    Number of Visits 7    Date for PT Re-Evaluation 12/11/22    Authorization Type [place] MEDICAID [facility]    Authorization Time Period 12/22-2/20/24    Authorization - Visit Number 2    Authorization - Number of Visits 10    PT Start Time 1147    PT Stop Time 1230    PT Time Calculation (min) 43 min    Activity Tolerance Patient tolerated treatment well    Behavior During Therapy WFL for tasks assessed/performed             Past Medical History:  Diagnosis Date   Arthritis    knee   Bronchitis    Diabetes mellitus without complication ([place])    Several years.   Hyperlipidemia 09/30/2015   Hypertension    Stomach pain    with diarrhea   Past Surgical History:  Procedure Laterality Date   COLONOSCOPY     Patient Active Problem List   Diagnosis Date Noted   Bilateral leg pain 05/20/2021   Paresthesia of both lower extremities 05/20/2021   History of simple renal cyst 04/23/2021   Bronchiectasis without complication ([place]) 03/00/9233   Language barrier 08/14/2020   Mixed conductive and sensorineural hearing loss of left ear with restricted hearing of right ear 06/19/2020   Solitary pulmonary nodule 03/05/2020   Chronic pain of left knee 03/29/2019   Chronic lung disease 10/31/2015   Hypertension associated with diabetes ([place]) 10/31/2015   Dyslipidemia associated with type 2 diabetes mellitus ([place]) 09/30/2015   Hyperlipidemia 09/30/2015      THERAPY DIAG:  Pain in left hip  Muscle weakness (generalized)  Difficulty in walking, not elsewhere classified  Rationale for Evaluation and Treatment  Rehabilitation  PERTINENT HISTORY: DM s complications   PAIN:  Are you having pain? Yes: NPRS scale: 0/10 Pain location: gron and buttock Pain description: ache, sharp Aggravating factors: Extended walking, certain movements Relieving factors: rest Pain scale on eval 0-3   PRECAUTIONS: None  SUBJECTIVE:                                                                                                                                                                                      SUBJECTIVE STATEMENT:  Pt reports she is doing well. No  hip pain and occasional lower level bilat knee and low back pain. Daughter reports    OBJECTIVE: (objective measures completed at initial evaluation unless otherwise dated)   DIAGNOSTIC FINDINGS:  L hip Xray: 10/07/22 IMPRESSION: Partial healing of mildly displaced fractures of the left superior and inferior pubic rami.   PATIENT SURVEYS:  NA   COGNITION: Overall cognitive status: Within functional limits for tasks assessed                         SENSATION: WFL   EDEMA:  NT   MUSCLE LENGTH: Hamstrings: Right NT deg; Left NT deg Marcello Moores test: Right NT deg; Left NT deg   PALPATION: TTP ant hip and gluteal area   LOWER EXTREMITY ROM: L LE AROM is WNLs and equal to the R Active ROM Right eval Left eval  Hip flexion      Hip extension      Hip abduction      Hip adduction      Hip internal rotation      Hip external rotation      Knee flexion      Knee extension      Ankle dorsiflexion      Ankle plantarflexion      Ankle inversion      Ankle eversion       (Blank rows = not tested)   LOWER EXTREMITY MMT:   MMT Right eval Left eval  Hip flexion 4+ 4  Hip extension 4 4  Hip abduction 4+ 4  Hip adduction 4+ 4  Hip internal rotation 4+ 4  Hip external rotation 4+ 4  Knee flexion 5 4+  Knee extension 5 4+  Ankle dorsiflexion      Ankle plantarflexion      Ankle inversion      Ankle eversion       (Blank rows =  not tested)   LOWER EXTREMITY SPECIAL TESTS:  NT   FUNCTIONAL TESTS:  5 times sit to stand: 20.6 c use of armrests 2 minute walk test: 370 s AD   GAIT: Distance walked: 310f Assistive device utilized: Single point cane Level of assistance: Modified independence Comments: decreased pace and step length     TODAY'S TREATMENT:  [facility] Adult PT Treatment:                                                DATE: 11/11/22 Therapeutic Exercise: Bridge x 15 SLR 10 x 2 each 3# Ball squeeze x15 5" Supine clam 2x15 3" GTB STS 2x10, cueing for proper technique Partial lunge using counter support x5 each with cues for step length and alignment.  SLS x5 each , best L=8", R=4" Updated HEP  [facility] Adult PT Treatment:                                                DATE: 11/05/22 Therapeutic Exercise: Bridge x 15 SLR 10 x 3 each  Ball squeeze Supine clam STS x 10 Partial lunge using counter support x5 each with cues for step length and alignment.   Therapeutic Activity: Kneeling to floor for prayer and then rise using chair support x 2 , airex  pad under knees                                                                                                                              [facility] Adult PT Treatment:                                                DATE: 10/22/22 Therapeutic Exercise: Developed, instructed in, and pt completed therex as noted in HEP   PATIENT EDUCATION:  Education details: Eval findings, POC, HEP Person educated: Patient and DIL Education method: Explanation, Demonstration, Tactile cues, Verbal cues, and Handouts Education comprehension: verbalized understanding, returned demonstration, verbal cues required, and tactile cues required   HOME EXERCISE PROGRAM: Access Code: [email or web address] URL: [email or web address] Date: 11/11/2022 Prepared by: Gar Ponto  Exercises - Supine Bridge  - 1-2 x daily - 7 x weekly - 1 sets - 10-15 reps - 5 hold - Supine Hip  Adduction Isometric with Ball  - 1-2 x daily - 7 x weekly - 1 sets - 10-15 reps - 5 hold - Hooklying Clamshell with Resistance  - 1-2 x daily - 7 x weekly - 1 sets - 10-15 reps - 3 hold - Supine Straight Leg Raises  - 1-2 x daily - 7 x weekly - 3 sets - 10-15 reps - 3 hold - Sit to Stand with Counter Support  - 1-2 x daily - 7 x weekly - 3 sets - 10-15 reps - Standing Partial Lunge  - 1 x daily - 7 x weekly - 1 sets - 5-10 reps - Standing Single Leg Stance with Counter Support  - 1 x daily - 7 x weekly - 1 sets - 5 reps - 15 hold   ASSESSMENT:   CLINICAL IMPRESSION: PT was completed for LE strengthening and balance. With bridging, sit to stand, and partail lunging decreased knee and hip ext strength is apparent. Pt reports she has not tried getting into kneeling for the prayer position. Pt tolerated PT today without adverse effects. Pt will continue to benefit from skilled PT to address impairments for improved functional mobility. Will reassess LE strength and sit to stand with use of armrest the next PT session.  OBJECTIVE IMPAIRMENTS: decreased activity tolerance, decreased balance, difficulty walking, decreased strength, and pain.    ACTIVITY LIMITATIONS: carrying, lifting, bending, squatting, stairs, and locomotion level   PARTICIPATION LIMITATIONS: meal prep, cleaning, laundry, and community activity   PERSONAL FACTORS: Fitness, Past/current experiences, and Time since onset of injury/illness/exacerbation are also affecting patient's functional outcome.    REHAB POTENTIAL: Excellent   CLINICAL DECISION MAKING: Stable/uncomplicated   EVALUATION COMPLEXITY: Low     GOALS:   SHORT TERM GOALS: Target date: 11/12/21 Pt will be Ind in an initial HEP Baseline:initiated Goal status: MET    LONG TERM GOALS: Target date:  11/10/22   Pt will be Ind in a final HEP to maintain achieved LOF Baseline: initiated Goal status: INITIAL   2.  Increase L hip strength to 4+ or greater and L knee  strength to 5/5 for improved functional mobility Baseline: see flow sheets Goal status: INITIAL   3.  Pt will [facility] in [facility]ulation [facility]f [facility] mobility Baseline: NT Goal status: INITIAL   4.  Pt will be ind c stair negotiation of 12 steps c handrail for community mobility Baseline: NT Goal status: INITIAL   5.  Improve 5xSTS by MCID of 5" and 2MWT by MCID of 510fas indication of improved functional mobility  Baseline: see flow sheets Goal status: INITIAL   PLAN:   PT FREQUENCY: 1x/week   PT DURATION: 6 weeks   PLANNED INTERVENTIONS: Therapeutic exercises, Therapeutic activity, Balance training, Gait training, Patient/Family education, Self Care, Dry Needling, Electrical stimulation, Cryotherapy, Moist heat, Taping, Ultrasound, Ionotophoresis '4mg'$ /ml Dexamethasone, Manual therapy, and Re-evaluation   PLAN FOR NEXT SESSION: Assess response to HEP; progress therex as indicated; use of modalities, manual therapy; and TPDN as indicated. Will reassess LE strength and sit to stand with use of armrest the next PT session.    Amarius Toto MS, PT 11/11/22 12:57 PM

## 2022-11-18 NOTE — Therapy (Signed)
OUTPATIENT PHYSICAL THERAPY TREATMENT NOTE   Patient Name: Cheryl Hopkins MRN: [id number] DOB:10-08-1951, 72 y.o., female Today's Date: 11/19/2022  PCP: Horald Pollen, MD   REFERRING PROVIDER: Glennon Mac, DO   REFERRING DIAG: [phone] (ICD-10-CM) - Left hip pain   END OF SESSION:   PT End of Session - 11/19/22 1201     Visit Number 4    Number of Visits 7    Date for PT Re-Evaluation 12/11/22    Authorization Type [place] MEDICAID [facility]    Authorization Time Period 12/22-2/20/24    Authorization - Visit Number 10    Authorization - Number of Visits 3    PT Start Time 1150    PT Stop Time 6387    PT Time Calculation (min) 45 min    Activity Tolerance Patient tolerated treatment well    Behavior During Therapy WFL for tasks assessed/performed             Past Medical History:  Diagnosis Date   Arthritis    knee   Bronchitis    Diabetes mellitus without complication ([place])    Several years.   Hyperlipidemia 09/30/2015   Hypertension    Stomach pain    with diarrhea   Past Surgical History:  Procedure Laterality Date   COLONOSCOPY     Patient Active Problem List   Diagnosis Date Noted   Bilateral leg pain 05/20/2021   Paresthesia of both lower extremities 05/20/2021   History of simple renal cyst 04/23/2021   Bronchiectasis without complication ([place]) 56/43/3295   Language barrier 08/14/2020   Mixed conductive and sensorineural hearing loss of left ear with restricted hearing of right ear 06/19/2020   Solitary pulmonary nodule 03/05/2020   Chronic pain of left knee 03/29/2019   Chronic lung disease 10/31/2015   Hypertension associated with diabetes ([place]) 10/31/2015   Dyslipidemia associated with type 2 diabetes mellitus ([place]) 09/30/2015   Hyperlipidemia 09/30/2015      THERAPY DIAG:  Pain in left hip  Muscle weakness (generalized)  Difficulty in walking, not elsewhere classified  Rationale for Evaluation and Treatment  Rehabilitation  PERTINENT HISTORY: DM s complications   PAIN:  Are you having pain? Yes: NPRS scale: 0/10 Pain location: gron and buttock Pain description: ache, sharp Aggravating factors: Extended walking, certain movements Relieving factors: rest Pain scale on eval 0-3   PRECAUTIONS: None  SUBJECTIVE:                                                                                                                                                                                      SUBJECTIVE STATEMENT:  Pt reports she is pleased with her  progress.   OBJECTIVE: (objective measures completed at initial evaluation unless otherwise dated)   DIAGNOSTIC FINDINGS:  L hip Xray: 10/07/22 IMPRESSION: Partial healing of mildly displaced fractures of the left superior and inferior pubic rami.   PATIENT SURVEYS:  NA   COGNITION: Overall cognitive status: Within functional limits for tasks assessed                         SENSATION: WFL   EDEMA:  NT   MUSCLE LENGTH: Hamstrings: Right NT deg; Left NT deg Marcello Moores test: Right NT deg; Left NT deg   PALPATION: TTP ant hip and gluteal area   LOWER EXTREMITY ROM: L LE AROM is WNLs and equal to the R Active ROM Right eval Left eval  Hip flexion      Hip extension      Hip abduction      Hip adduction      Hip internal rotation      Hip external rotation      Knee flexion      Knee extension      Ankle dorsiflexion      Ankle plantarflexion      Ankle inversion      Ankle eversion       (Blank rows = not tested)   LOWER EXTREMITY MMT:   MMT Right eval Left eval  Hip flexion 4+ 4  Hip extension 4 4  Hip abduction 4+ 4  Hip adduction 4+ 4  Hip internal rotation 4+ 4  Hip external rotation 4+ 4  Knee flexion 5 4+  Knee extension 5 4+  Ankle dorsiflexion      Ankle plantarflexion      Ankle inversion      Ankle eversion       (Blank rows = not tested)   LOWER EXTREMITY SPECIAL TESTS:  NT   FUNCTIONAL  TESTS:  5 times sit to stand: 20.6 c use of armrests 2 minute walk test: 370 s AD; 483 s AD   GAIT: Distance walked: 357f Assistive device utilized: Single point cane Level of assistance: Modified independence Comments: decreased pace and step length    TODAY'S TREATMENT:  [facility] Adult PT Treatment:                                                DATE: 11/19/22 Therapeutic Exercise: NuStep 5 mins L5 UE/LE Standing hip abd 2x10 4# each Standing hip ext 2x10 4# each Omega knee ext 2x10 15# each Omega knee flex 2x10 15# each STS 2x10 Bari-mat table. Decreased descent control near end of sitting Therapeutic Activity: Asc/dsc 12 steps c alternating pattern c 1 HR assist 2MWT  [facility] Adult PT Treatment:                                                DATE: 11/11/22 Therapeutic Exercise: Bridge x 15 SLR 10 x 2 each 3# Ball squeeze x15 5" Supine clam 2x15 3" GTB STS 2x10, cueing for proper technique Partial lunge using counter support x5 each with cues for step length and alignment.  SLS x5 each , best L=8", R=4" Updated HEP  [facility] Adult PT Treatment:  DATE: 11/05/22 Therapeutic Exercise: Bridge x 15 SLR 10 x 3 each  Ball squeeze Supine clam STS x 10 Partial lunge using counter support x5 each with cues for step length and alignment.   Therapeutic Activity: Kneeling to floor for prayer and then rise using chair support x 2 , airex pad under knees   PATIENT EDUCATION:  Education details: Eval findings, POC, HEP Person educated: Patient and DIL Education method: Explanation, Demonstration, Tactile cues, Verbal cues, and Handouts Education comprehension: verbalized understanding, returned demonstration, verbal cues required, and tactile cues required   HOME EXERCISE PROGRAM: Access Code: [email or web address] URL: [email or web address] Date: 11/11/2022 Prepared by: Gar Ponto  Exercises - Supine Bridge  - 1-2 x daily - 7 x weekly -  1 sets - 10-15 reps - 5 hold - Supine Hip Adduction Isometric with Ball  - 1-2 x daily - 7 x weekly - 1 sets - 10-15 reps - 5 hold - Hooklying Clamshell with Resistance  - 1-2 x daily - 7 x weekly - 1 sets - 10-15 reps - 3 hold - Supine Straight Leg Raises  - 1-2 x daily - 7 x weekly - 3 sets - 10-15 reps - 3 hold - Sit to Stand with Counter Support  - 1-2 x daily - 7 x weekly - 3 sets - 10-15 reps - Standing Partial Lunge  - 1 x daily - 7 x weekly - 1 sets - 5-10 reps - Standing Single Leg Stance with Counter Support  - 1 x daily - 7 x weekly - 1 sets - 5 reps - 15 hold   ASSESSMENT:   CLINICAL IMPRESSION: PT was completed for LE strengthening, step negotiation, reassessment of her 2MWT distance. With steps, pt use 1 handrail for support. Effort on steps was somewhat labored which is consistent with decreased descent control near the end sitting. With reassessment of the 2MWT, pt made very good progress. Pt is making appropriate progress, but benefit from continued LE strengthening.   OBJECTIVE IMPAIRMENTS: decreased activity tolerance, decreased balance, difficulty walking, decreased strength, and pain.    ACTIVITY LIMITATIONS: carrying, lifting, bending, squatting, stairs, and locomotion level   PARTICIPATION LIMITATIONS: meal prep, cleaning, laundry, and community activity   PERSONAL FACTORS: Fitness, Past/current experiences, and Time since onset of injury/illness/exacerbation are also affecting patient's functional outcome.    REHAB POTENTIAL: Excellent   CLINICAL DECISION MAKING: Stable/uncomplicated   EVALUATION COMPLEXITY: Low     GOALS:   SHORT TERM GOALS: Target date: 11/12/21 Pt will be Ind in an initial HEP Baseline:initiated Goal status: MET    LONG TERM GOALS: Target date: 11/10/22   Pt will be Ind in a final HEP to maintain achieved LOF Baseline: initiated Goal status: INITIAL   2.  Increase L hip strength to 4+ or greater and L knee strength to 5/5 for improved  functional mobility Baseline: see flow sheets Goal status: INITIAL   3.  Pt will progress in ambulation to 572f s [facility] Baseline: NT Goal status: INITIAL   4.  Pt will be ind c stair negotiation of 12 steps c handrail for community mobility Baseline: NT Status: Completed properly Goal status: MET   5.  Improve 5xSTS by MCID of 5" and 2MWT by MCID of 573fas indication of improved functional mobility  Baseline: see flow sheets Status: 2MWT 483' Goal status: MET for 2 MWT   PLAN:   PT FREQUENCY: 1x/week   PT DURATION: 6 weeks   PLANNED INTERVENTIONS:  Therapeutic exercises, Therapeutic activity, Balance training, Gait training, Patient/Family education, Self Care, Dry Needling, Electrical stimulation, Cryotherapy, Moist heat, Taping, Ultrasound, Ionotophoresis '4mg'$ /ml Dexamethasone, Manual therapy, and Re-evaluation   PLAN FOR NEXT SESSION: Assess response to HEP; progress therex as indicated; use of modalities, manual therapy; and TPDN as indicated. Will reassess LE strength and sit to stand with use of armrest the next PT session.    Maritza Goldsborough MS, PT 11/19/22 3:58 PM

## 2022-11-19 ENCOUNTER — Ambulatory Visit: Payer: Medicaid Other

## 2022-11-19 DIAGNOSIS — M25552 Pain in left hip: Secondary | ICD-10-CM | POA: Diagnosis not present

## 2022-11-19 DIAGNOSIS — M6281 Muscle weakness (generalized): Secondary | ICD-10-CM

## 2022-11-19 DIAGNOSIS — R262 Difficulty in walking, not elsewhere classified: Secondary | ICD-10-CM

## 2022-11-27 ENCOUNTER — Ambulatory Visit: Payer: Medicaid Other

## 2022-11-27 DIAGNOSIS — R262 Difficulty in walking, not elsewhere classified: Secondary | ICD-10-CM

## 2022-11-27 DIAGNOSIS — M25552 Pain in left hip: Secondary | ICD-10-CM | POA: Diagnosis not present

## 2022-11-27 DIAGNOSIS — M6281 Muscle weakness (generalized): Secondary | ICD-10-CM

## 2022-11-27 NOTE — Therapy (Signed)
OUTPATIENT PHYSICAL THERAPY TREATMENT NOTE   Patient Name: Cheryl Hopkins MRN: [id number] DOB:February 22, 1951, 72 y.o., female Today's Date: 11/27/2022  PCP: Horald Pollen, MD   REFERRING PROVIDER: Glennon Mac, DO   REFERRING DIAG: [phone] (ICD-10-CM) - Left hip pain   END OF SESSION:   PT End of Session - 11/27/22 1322     Visit Number 5    Number of Visits 7    Date for PT Re-Evaluation 12/11/22    Authorization Time Period 12/22-2/20/24    Authorization - Visit Number 10    Authorization - Number of Visits 4    Progress Note Due on Visit 10    PT Start Time 1318    PT Stop Time 1400    PT Time Calculation (min) 42 min    Activity Tolerance Patient tolerated treatment well    Behavior During Therapy WFL for tasks assessed/performed             Past Medical History:  Diagnosis Date   Arthritis    knee   Bronchitis    Diabetes mellitus without complication ([place])    Several years.   Hyperlipidemia 09/30/2015   Hypertension    Stomach pain    with diarrhea   Past Surgical History:  Procedure Laterality Date   COLONOSCOPY     Patient Active Problem List   Diagnosis Date Noted   Bilateral leg pain 05/20/2021   Paresthesia of both lower extremities 05/20/2021   History of simple renal cyst 04/23/2021   Bronchiectasis without complication ([place]) 42/59/5638   Language barrier 08/14/2020   Mixed conductive and sensorineural hearing loss of left ear with restricted hearing of right ear 06/19/2020   Solitary pulmonary nodule 03/05/2020   Chronic pain of left knee 03/29/2019   Chronic lung disease 10/31/2015   Hypertension associated with diabetes ([place]) 10/31/2015   Dyslipidemia associated with type 2 diabetes mellitus ([place]) 09/30/2015   Hyperlipidemia 09/30/2015      THERAPY DIAG:  Pain in left hip  Muscle weakness (generalized)  Difficulty in walking, not elsewhere classified  Rationale for Evaluation and Treatment Rehabilitation  PERTINENT  HISTORY: DM s complications  PAIN:  Are you having pain? Yes: NPRS scale: 0/10 Pain location: gron and buttock Pain description: ache, sharp Aggravating factors: Extended walking, certain movements Relieving factors: rest Pain scale on eval 0-3   PRECAUTIONS: None  SUBJECTIVE:                                                                                                                                                                                      SUBJECTIVE STATEMENT:  Pt reports she is doing well. DIL  reports no issues with LOB at home. Pt reports no L hip pain.   OBJECTIVE: (objective measures completed at initial evaluation unless otherwise dated)   DIAGNOSTIC FINDINGS:  L hip Xray: 10/07/22 IMPRESSION: Partial healing of mildly displaced fractures of the left superior and inferior pubic rami.   PATIENT SURVEYS:  NA   COGNITION: Overall cognitive status: Within functional limits for tasks assessed                         SENSATION: WFL   EDEMA:  NT   MUSCLE LENGTH: Hamstrings: Right NT deg; Left NT deg Marcello Moores test: Right NT deg; Left NT deg   PALPATION: TTP ant hip and gluteal area   LOWER EXTREMITY ROM: L LE AROM is WNLs and equal to the R Active ROM Right eval Left eval  Hip flexion      Hip extension      Hip abduction      Hip adduction      Hip internal rotation      Hip external rotation      Knee flexion      Knee extension      Ankle dorsiflexion      Ankle plantarflexion      Ankle inversion      Ankle eversion       (Blank rows = not tested)   LOWER EXTREMITY MMT:   MMT Right eval Left eval LT 11/27/22  Hip flexion 4+ 4 4+  Hip extension 4 4 4+  Hip abduction 4+ 4 4+  Hip adduction 4+ 4   Hip internal rotation 4+ 4   Hip external rotation 4+ 4   Knee flexion 5 4+   Knee extension 5 4+   Ankle dorsiflexion       Ankle plantarflexion       Ankle inversion       Ankle eversion        (Blank rows = not tested)   LOWER  EXTREMITY SPECIAL TESTS:  NT   FUNCTIONAL TESTS:  5 times sit to stand: 20.6 c use of armrests. 11/27/22=14.7 2 minute walk test: 370 s AD; 483 s AD   GAIT: Distance walked: 375f Assistive device utilized: Single point cane Level of assistance: Modified independence Comments: decreased pace and step length    TODAY'S TREATMENT: [facility] Adult PT Treatment:                                                DATE: 11/27/22 Therapeutic Exercise: NuStep 5 mins L6 UE/LE STS 2x5 Cybex leg press 2x10 40# SLS each  Cybex hip abd 2x10 12.5#   [facility] Adult PT Treatment:                                                DATE: 11/19/22 Therapeutic Exercise: NuStep 5 mins L5 UE/LE Standing hip abd 2x10 4# each Standing hip ext 2x10 4# each Omega knee ext 2x10 15# each Omega knee flex 2x10 15# each STS 2x10 Bari-mat table. Decreased descent control near end of sitting Therapeutic Activity: Asc/dsc 12 steps c alternating pattern c 1 HR assist 2MWT   PATIENT EDUCATION:  Education details: Eval findings,  POC, HEP Person educated: Patient and DIL Education method: Explanation, Demonstration, Tactile cues, Verbal cues, and Handouts Education comprehension: verbalized understanding, returned demonstration, verbal cues required, and tactile cues required   HOME EXERCISE PROGRAM: Access Code: [email or web address] URL: [email or web address] Date: 11/11/2022 Prepared by: Gar Ponto  Exercises - Supine Bridge  - 1-2 x daily - 7 x weekly - 1 sets - 10-15 reps - 5 hold - Supine Hip Adduction Isometric with Ball  - 1-2 x daily - 7 x weekly - 1 sets - 10-15 reps - 5 hold - Hooklying Clamshell with Resistance  - 1-2 x daily - 7 x weekly - 1 sets - 10-15 reps - 3 hold - Supine Straight Leg Raises  - 1-2 x daily - 7 x weekly - 3 sets - 10-15 reps - 3 hold - Sit to Stand with Counter Support  - 1-2 x daily - 7 x weekly - 3 sets - 10-15 reps - Standing Partial Lunge  - 1 x daily - 7 x weekly - 1 sets - 5-10  reps - Standing Single Leg Stance with Counter Support  - 1 x daily - 7 x weekly - 1 sets - 5 reps - 15 hold   ASSESSMENT:   CLINICAL IMPRESSION: PT was continued progressive strengthening of the L hip. 5xSTS and L hip strength were reassessed with both being found improved and the set goals met (MMT for hip strength). Pt is making appropriate progress with PT. Family reports no issues with balance and mobility at home. Pt will benefit from PT through her POC to continue to address strength and to establish a final HEP for rehab at home to maintain her achieved LOF.  OBJECTIVE IMPAIRMENTS: decreased activity tolerance, decreased balance, difficulty walking, decreased strength, and pain.    ACTIVITY LIMITATIONS: carrying, lifting, bending, squatting, stairs, and locomotion level   PARTICIPATION LIMITATIONS: meal prep, cleaning, laundry, and community activity   PERSONAL FACTORS: Fitness, Past/current experiences, and Time since onset of injury/illness/exacerbation are also affecting patient's functional outcome.    REHAB POTENTIAL: Excellent   CLINICAL DECISION MAKING: Stable/uncomplicated   EVALUATION COMPLEXITY: Low     GOALS:   SHORT TERM GOALS: Target date: 11/12/21 Pt will be Ind in an initial HEP Baseline:initiated Goal status: MET    LONG TERM GOALS: Target date: 11/10/22   Pt will be Ind in a final HEP to maintain achieved LOF Baseline: initiated Goal status: INITIAL   2.  Increase L hip strength to 4+ or greater and L knee strength to 5/5 for improved functional mobility Baseline: see flow sheets Status: 11/27/22=see flow sheets Goal status: MET for L hip   3.  Pt will progress in ambulation to 534f s [facility] for community mobility Baseline: NT Goal status: INITIAL   4.  Pt will be ind c stair negotiation of 12 steps c handrail for community mobility Baseline: NT Status: Completed properly Goal status: MET   5.  Improve 5xSTS by MCID of 5" and 2MWT by MCID of  548fas indication of improved functional mobility  Baseline: see flow sheets Status: 2MWT 483', 11/27/22=14.7" Goal status: MET for 2 MWT   PLAN:   PT FREQUENCY: 1x/week   PT DURATION: 6 weeks   PLANNED INTERVENTIONS: Therapeutic exercises, Therapeutic activity, Balance training, Gait training, Patient/Family education, Self Care, Dry Needling, Electrical stimulation, Cryotherapy, Moist heat, Taping, Ultrasound, Ionotophoresis '4mg'$ /ml Dexamethasone, Manual therapy, and Re-evaluation   PLAN FOR NEXT SESSION: Assess response to HEP; progress therex as indicated;  use of modalities, manual therapy; and TPDN as indicated. Will reassess LE strength and sit to stand with use of armrest the next PT session.    Daekwon Beswick MS, PT 11/27/22 2:25 PM

## 2022-11-28 ENCOUNTER — Other Ambulatory Visit: Payer: Self-pay | Admitting: Emergency Medicine

## 2022-11-28 DIAGNOSIS — R202 Paresthesia of skin: Secondary | ICD-10-CM

## 2022-12-02 ENCOUNTER — Ambulatory Visit: Payer: Medicaid Other | Admitting: Physical Therapy

## 2022-12-02 ENCOUNTER — Encounter: Payer: Self-pay | Admitting: Physical Therapy

## 2022-12-02 DIAGNOSIS — M6281 Muscle weakness (generalized): Secondary | ICD-10-CM

## 2022-12-02 DIAGNOSIS — M25552 Pain in left hip: Secondary | ICD-10-CM

## 2022-12-02 DIAGNOSIS — R262 Difficulty in walking, not elsewhere classified: Secondary | ICD-10-CM

## 2022-12-02 NOTE — Therapy (Signed)
OUTPATIENT PHYSICAL THERAPY TREATMENT NOTE   Patient Name: Cheryl Hopkins MRN: [id number] DOB:11-17-1950, 72 y.o., female Today's Date: 12/02/2022  PCP: Horald Pollen, MD   REFERRING PROVIDER: Glennon Mac, DO   REFERRING DIAG: [phone] (ICD-10-CM) - Left hip pain   END OF SESSION:   PT End of Session - 12/02/22 1234     Visit Number 6    Number of Visits 7    Date for PT Re-Evaluation 12/11/22    Authorization Type [place] MEDICAID [facility]    Authorization Time Period 12/22-2/20/24    Authorization - Number of Visits 5    Progress Note Due on Visit 10    PT Start Time 5035    PT Stop Time 1315    PT Time Calculation (min) 45 min             Past Medical History:  Diagnosis Date   Arthritis    knee   Bronchitis    Diabetes mellitus without complication ([place])    Several years.   Hyperlipidemia 09/30/2015   Hypertension    Stomach pain    with diarrhea   Past Surgical History:  Procedure Laterality Date   COLONOSCOPY     Patient Active Problem List   Diagnosis Date Noted   Bilateral leg pain 05/20/2021   Paresthesia of both lower extremities 05/20/2021   History of simple renal cyst 04/23/2021   Bronchiectasis without complication ([place]) 46/56/8127   Language barrier 08/14/2020   Mixed conductive and sensorineural hearing loss of left ear with restricted hearing of right ear 06/19/2020   Solitary pulmonary nodule 03/05/2020   Chronic pain of left knee 03/29/2019   Chronic lung disease 10/31/2015   Hypertension associated with diabetes ([place]) 10/31/2015   Dyslipidemia associated with type 2 diabetes mellitus ([place]) 09/30/2015   Hyperlipidemia 09/30/2015      THERAPY DIAG:  Pain in left hip  Muscle weakness (generalized)  Difficulty in walking, not elsewhere classified  Rationale for Evaluation and Treatment Rehabilitation  PERTINENT HISTORY: DM s complications  PAIN:  Are you having pain? Yes: NPRS scale: 0/10 Pain location: gron and  buttock Pain description: ache, sharp Aggravating factors: Extended walking, certain movements Relieving factors: rest Pain scale on eval 0-3   PRECAUTIONS: None  SUBJECTIVE:                                                                                                                                                                                      SUBJECTIVE STATEMENT:  Pt reports she is doing well. DIL reports no issues with LOB at home. Pt reports no L hip pain.   OBJECTIVE: (objective measures completed  at initial evaluation unless otherwise dated)   DIAGNOSTIC FINDINGS:  L hip Xray: 10/07/22 IMPRESSION: Partial healing of mildly displaced fractures of the left superior and inferior pubic rami.   PATIENT SURVEYS:  NA   COGNITION: Overall cognitive status: Within functional limits for tasks assessed                         SENSATION: WFL   EDEMA:  NT   MUSCLE LENGTH: Hamstrings: Right NT deg; Left NT deg Marcello Moores test: Right NT deg; Left NT deg   PALPATION: TTP ant hip and gluteal area   LOWER EXTREMITY ROM: L LE AROM is WNLs and equal to the R Active ROM Right eval Left eval  Hip flexion      Hip extension      Hip abduction      Hip adduction      Hip internal rotation      Hip external rotation      Knee flexion      Knee extension      Ankle dorsiflexion      Ankle plantarflexion      Ankle inversion      Ankle eversion       (Blank rows = not tested)   LOWER EXTREMITY MMT:   MMT Right eval Left eval LT 11/27/22  Hip flexion 4+ 4 4+  Hip extension 4 4 4+  Hip abduction 4+ 4 4+  Hip adduction 4+ 4   Hip internal rotation 4+ 4   Hip external rotation 4+ 4   Knee flexion 5 4+   Knee extension 5 4+   Ankle dorsiflexion       Ankle plantarflexion       Ankle inversion       Ankle eversion        (Blank rows = not tested)   LOWER EXTREMITY SPECIAL TESTS:  NT   FUNCTIONAL TESTS:  5 times sit to stand: 20.6 c use of armrests.  11/27/22=14.7 2 minute walk test: 370 s AD; 483 s AD   GAIT: Distance walked: 379f Assistive device utilized: Single point cane Level of assistance: Modified independence Comments: decreased pace and step length    TODAY'S TREATMENT: [facility] Adult PT Treatment:                                                DATE: 12/02/22 Therapeutic Exercise: Nustep L6 UE/LE x 5 minutes  4 #  3 way ankle , 10 x 2 each 10# bil OMEGA knee ext 10 x 2  25 # bil OMEGA knee flex  10 x 2  Leg  press 40# 10 x 2  SLR x 10 each Bridge x10  Blue band clam 10 x 2    [facility] Adult PT Treatment:                                                DATE: 11/27/22 Therapeutic Exercise: NuStep 5 mins L6 UE/LE STS 2x5 Cybex leg press 2x10 40# SLS each  Cybex hip abd 2x10 12.5#   [facility] Adult PT Treatment:  DATE: 11/19/22 Therapeutic Exercise: NuStep 5 mins L5 UE/LE Standing hip abd 2x10 4# each Standing hip ext 2x10 4# each Omega knee ext 2x10 15# each Omega knee flex 2x10 15# each STS 2x10 Bari-mat table. Decreased descent control near end of sitting Therapeutic Activity: Asc/dsc 12 steps c alternating pattern c 1 HR assist 2MWT   PATIENT EDUCATION:  Education details: Eval findings, POC, HEP Person educated: Patient and DIL Education method: Explanation, Demonstration, Tactile cues, Verbal cues, and Handouts Education comprehension: verbalized understanding, returned demonstration, verbal cues required, and tactile cues required   HOME EXERCISE PROGRAM: Access Code: [email or web address] URL: [email or web address] Date: 11/11/2022 Prepared by: Gar Ponto  Exercises - Supine Bridge  - 1-2 x daily - 7 x weekly - 1 sets - 10-15 reps - 5 hold - Supine Hip Adduction Isometric with Ball  - 1-2 x daily - 7 x weekly - 1 sets - 10-15 reps - 5 hold - Hooklying Clamshell with Resistance  - 1-2 x daily - 7 x weekly - 1 sets - 10-15 reps - 3 hold - Supine Straight Leg  Raises  - 1-2 x daily - 7 x weekly - 3 sets - 10-15 reps - 3 hold - Sit to Stand with Counter Support  - 1-2 x daily - 7 x weekly - 3 sets - 10-15 reps - Standing Partial Lunge  - 1 x daily - 7 x weekly - 1 sets - 5-10 reps - Standing Single Leg Stance with Counter Support  - 1 x daily - 7 x weekly - 1 sets - 5 reps - 15 hold   ASSESSMENT:   CLINICAL IMPRESSION: PT was continued progressive strengthening of the L hip. Based on pt and DIL subjective and previous objective measures, discharge was offered today. DIL requested to see primary PT for DC visit. Continued with previous therex. Pt reports min HEP at home because she does not have strengthening machines at home. She is not performing prescribed HEP. DIL reports she is walking a lot more and doing well getting up and down to pray.  Pt will benefit from PT through her POC to continue to address strength and to establish a final HEP for rehab at home to maintain her achieved LOF.  OBJECTIVE IMPAIRMENTS: decreased activity tolerance, decreased balance, difficulty walking, decreased strength, and pain.    ACTIVITY LIMITATIONS: carrying, lifting, bending, squatting, stairs, and locomotion level   PARTICIPATION LIMITATIONS: meal prep, cleaning, laundry, and community activity   PERSONAL FACTORS: Fitness, Past/current experiences, and Time since onset of injury/illness/exacerbation are also affecting patient's functional outcome.    REHAB POTENTIAL: Excellent   CLINICAL DECISION MAKING: Stable/uncomplicated   EVALUATION COMPLEXITY: Low     GOALS:   SHORT TERM GOALS: Target date: 11/12/21 Pt will be Ind in an initial HEP Baseline:initiated Goal status: MET    LONG TERM GOALS: Target date: 11/10/22   Pt will be Ind in a final HEP to maintain achieved LOF Baseline: initiated Goal status: INITIAL   2.  Increase L hip strength to 4+ or greater and L knee strength to 5/5 for improved functional mobility Baseline: see flow sheets Status:  11/27/22=see flow sheets Goal status: MET for L hip   3.  Pt will progress in ambulation to 568f s [facility] for [facility] mobility Baseline: NT Goal status: INITIAL   4.  Pt will be ind c stair negotiation of 12 steps c handrail for community mobility Baseline: NT Status: Completed properly Goal status: MET   5.  Improve 5xSTS by MCID of 5" and 2MWT by MCID of 21f as indication of improved functional mobility  Baseline: see flow sheets Status: 2MWT 483', 11/27/22=14.7" Goal status: MET for 2 MWT   PLAN:   PT FREQUENCY: 1x/week   PT DURATION: 6 weeks   PLANNED INTERVENTIONS: Therapeutic exercises, Therapeutic activity, Balance training, Gait training, Patient/Family education, Self Care, Dry Needling, Electrical stimulation, Cryotherapy, Moist heat, Taping, Ultrasound, Ionotophoresis '4mg'$ /ml Dexamethasone, Manual therapy, and Re-evaluation   PLAN FOR NEXT SESSION: Assess response to HEP; progress therex as indicated; use of modalities, manual therapy; and TPDN as indicated. Will reassess LE strength and sit to stand with use of armrest the next PT session.    JHessie Diener PTA 12/02/22 1:35 PM Phone: [phone]: [phone]

## 2022-12-09 ENCOUNTER — Ambulatory Visit: Payer: Medicaid Other | Attending: Sports Medicine

## 2022-12-09 DIAGNOSIS — M6281 Muscle weakness (generalized): Secondary | ICD-10-CM | POA: Diagnosis present

## 2022-12-09 DIAGNOSIS — R262 Difficulty in walking, not elsewhere classified: Secondary | ICD-10-CM | POA: Diagnosis present

## 2022-12-09 DIAGNOSIS — M25552 Pain in left hip: Secondary | ICD-10-CM | POA: Insufficient documentation

## 2022-12-09 NOTE — Therapy (Signed)
OUTPATIENT PHYSICAL THERAPY TREATMENT NOTE/Discharge   Patient Name: Cheryl Hopkins MRN: [id number] DOB:24-Feb-1951, 72 y.o., female Today's Date: 12/09/2022  PCP: Horald Pollen, MD   REFERRING PROVIDER: Glennon Mac, DO   REFERRING DIAG: [phone] (ICD-10-CM) - Left hip pain   END OF SESSION:   PT End of Session - 12/09/22 1110     Visit Number 7    Number of Visits 7    Date for PT Re-Evaluation 12/11/22    Authorization Type [place] MEDICAID [facility]    Authorization Time Period 12/22-2/20/24    Authorization - Visit Number 10    Authorization - Number of Visits 6    Progress Note Due on Visit 10    PT Start Time 1100    PT Stop Time 1145    PT Time Calculation (min) 45 min    Activity Tolerance Patient tolerated treatment well    Behavior During Therapy WFL for tasks assessed/performed              Past Medical History:  Diagnosis Date   Arthritis    knee   Bronchitis    Diabetes mellitus without complication ([place])    Several years.   Hyperlipidemia 09/30/2015   Hypertension    Stomach pain    with diarrhea   Past Surgical History:  Procedure Laterality Date   COLONOSCOPY     Patient Active Problem List   Diagnosis Date Noted   Bilateral leg pain 05/20/2021   Paresthesia of both lower extremities 05/20/2021   History of simple renal cyst 04/23/2021   Bronchiectasis without complication ([place]) 22/97/9892   Language barrier 08/14/2020   Mixed conductive and sensorineural hearing loss of left ear with restricted hearing of right ear 06/19/2020   Solitary pulmonary nodule 03/05/2020   Chronic pain of left knee 03/29/2019   Chronic lung disease 10/31/2015   Hypertension associated with diabetes ([place]) 10/31/2015   Dyslipidemia associated with type 2 diabetes mellitus ([place]) 09/30/2015   Hyperlipidemia 09/30/2015      THERAPY DIAG:  Pain in left hip  Muscle weakness (generalized)  Difficulty in walking, not elsewhere classified  Rationale for  Evaluation and Treatment Rehabilitation  PERTINENT HISTORY: DM s complications  PAIN:  Are you having pain? Yes: NPRS scale: 0/10 Pain location: gron and buttock Pain description: ache, sharp Aggravating factors: Extended walking, certain movements Relieving factors: rest Pain scale on eval 0-3   PRECAUTIONS: None  SUBJECTIVE:                                                                                                                                                                                      SUBJECTIVE  STATEMENT:  Pt reports no L hip pain and that she is walking outside regularly.   OBJECTIVE: (objective measures completed at initial evaluation unless otherwise dated)   DIAGNOSTIC FINDINGS:  L hip Xray: 10/07/22 IMPRESSION: Partial healing of mildly displaced fractures of the left superior and inferior pubic rami.   PATIENT SURVEYS:  NA   COGNITION: Overall cognitive status: Within functional limits for tasks assessed                         SENSATION: WFL   EDEMA:  NT   MUSCLE LENGTH: Hamstrings: Right NT deg; Left NT deg Marcello Moores test: Right NT deg; Left NT deg   PALPATION: TTP ant hip and gluteal area   LOWER EXTREMITY ROM: L LE AROM is WNLs and equal to the R Active ROM Right eval Left eval  Hip flexion      Hip extension      Hip abduction      Hip adduction      Hip internal rotation      Hip external rotation      Knee flexion      Knee extension      Ankle dorsiflexion      Ankle plantarflexion      Ankle inversion      Ankle eversion       (Blank rows = not tested)   LOWER EXTREMITY MMT:   MMT Right eval Left eval LT 11/27/22  Hip flexion 4+ 4 4+  Hip extension 4 4 4+  Hip abduction 4+ 4 4+  Hip adduction 4+ 4   Hip internal rotation 4+ 4   Hip external rotation 4+ 4   Knee flexion 5 4+   Knee extension 5 4+   Ankle dorsiflexion       Ankle plantarflexion       Ankle inversion       Ankle eversion        (Blank  rows = not tested)   LOWER EXTREMITY SPECIAL TESTS:  NT   FUNCTIONAL TESTS:  5 times sit to stand: 20.6 c use of armrests. 11/27/22=14.7 2 minute walk test: 370 s AD; 483 s AD   GAIT: Distance walked: 344f Assistive device utilized: Single point cane Level of assistance: Modified independence Comments: decreased pace and step length    TODAY'S TREATMENT: [facility] Adult PT Treatment:                                                DATE: 12/09/22 Therapeutic Exercise: Supine clams 2x10 BluTB SL hip abd 2x10 each Supine SLR 2x10 each Final HEP Therapeutic Activity: 5xSTS= 12.3" s use of hands STS 10x Walking tolerance 925' with pt able to continued walking, but stopped due to meeting goal SLS c hand assist as needed, >20" each Tandem standing c hand assist as needed >30" each  [facility]                                                [facility]: [facility] Therapeutic Exercise: Nustep L6 UE/LE x 5 minutes  4 #  3 way ankle , 10 x 2 each 10# bil OMEGA knee ext 10  x 2  25 # bil OMEGA knee flex  10 x 2  Leg  press 40# 10 x 2  SLR x 10 each Bridge x10  Blue band clam 10 x 2    [facility] Adult PT Treatment:                                                DATE: 11/27/22 Therapeutic Exercise: NuStep 5 mins L6 UE/LE STS 2x5 Cybex leg press 2x10 40# SLS each  Cybex hip abd 2x10 12.5#   [facility] Adult PT Treatment:                                                DATE: 11/19/22 Therapeutic Exercise: NuStep 5 mins L5 UE/LE Standing hip abd 2x10 4# each Standing hip ext 2x10 4# each Omega knee ext 2x10 15# each Omega knee flex 2x10 15# each STS 2x10 Bari-mat table. Decreased descent control near end of sitting Therapeutic Activity: Asc/dsc 12 steps c alternating pattern c 1 HR assist 2MWT   PATIENT EDUCATION:  Education details: Eval findings, POC, HEP Person educated: Patient and DIL Education method: Explanation, Demonstration, Tactile cues, Verbal cues, and Handouts Education  comprehension: verbalized understanding, returned demonstration, verbal cues required, and tactile cues required   HOME EXERCISE PROGRAM: Access Code: [email or web address] URL: [email or web address] Date: 12/09/2022 Prepared by: Gar Ponto  Exercises - Hooklying Clamshell with Resistance  - 1 x daily - 7 x weekly - 3 sets - 10-15 reps - 3 hold - Supine Straight Leg Raises  - 1 x daily - 7 x weekly - 3 sets - 10-15 reps - 3 hold - Sidelying Hip Abduction  - 1 x daily - 7 x weekly - 3 sets - 10 reps - 3 hold - Sit to Stand with Counter Support  - 1 x daily - 7 x weekly - 3 sets - 10 reps - Standing Single Leg Stance with Counter Support  - 1 x daily - 7 x weekly - 1 sets - 5 reps - 15 hold - Standing Tandem Balance with Counter Support  - 1 x daily - 7 x weekly - 1 sets - 5 reps - 15 hold   ASSESSMENT:   CLINICAL IMPRESSION: Pt completed her final PT session today. Pt has made good progress re: pain, strength, and function. With SLS and tandem standing the pt demonstrates appropriate balance, and is walking distances sufficient for community ambulation. Pt is Ind in a final HEP to maintain functional gains. Pt is in agreement with DC at this time.  OBJECTIVE IMPAIRMENTS: decreased activity tolerance, decreased balance, difficulty walking, decreased strength, and pain.    ACTIVITY LIMITATIONS: carrying, lifting, bending, squatting, stairs, and locomotion level   PARTICIPATION LIMITATIONS: meal prep, cleaning, laundry, and community activity   PERSONAL FACTORS: Fitness, Past/current experiences, and Time since onset of injury/illness/exacerbation are also affecting patient's functional outcome.    REHAB POTENTIAL: Excellent   CLINICAL DECISION MAKING: Stable/uncomplicated   EVALUATION COMPLEXITY: Low     GOALS:   SHORT TERM GOALS: Target date: 11/12/21 Pt will be Ind in an initial HEP Baseline:initiated Goal status: MET    LONG TERM GOALS: Target date: 11/10/22   Pt will be  Ind in  a final HEP to maintain achieved LOF Baseline: initiated Status: Pt completes properly, has not been consistent at home. Is  Goal status: MET   2.  Increase L hip strength to 4+ or greater and L knee strength to 5/5 for improved functional mobility Baseline: see flow sheets Status: 11/27/22=see flow sheets Goal status: MET for L hip   3.  Pt will progress in ambulation to 528f s [facility] community mobility Baseline: NT Status: 12/09/22= 925' Goal status: INITIAL   4.  Pt will be ind c stair negotiation of 12 steps c handrail for community mobility Baseline: NT Status: Completed properly Goal status: MET   5.  Improve 5xSTS by MCID of 5" and 2MWT by MCID of 563fas indication of improved functional mobility  Baseline: see flow sheets Status: 2MWT 483', 11/27/22=14.7", 12/09/22=12.3 Goal status: MET    PLAN:   PT FREQUENCY: 1x/week   PT DURATION: 6 weeks   PLANNED INTERVENTIONS: Therapeutic exercises, Therapeutic activity, Balance training, Gait training, Patient/Family education, Self Care, Dry Needling, Electrical stimulation, Cryotherapy, Moist heat, Taping, Ultrasound, Ionotophoresis '4mg'$ /ml Dexamethasone, Manual therapy, and Re-evaluation   PLAN FOR NEXT SESSION: Assess response to HEP; progress therex as indicated; use of modalities, manual therapy; and TPDN as indicated. Will reassess LE strength and sit to stand with use of armrest the next PT session.   PHYSICAL THERAPY DISCHARGE SUMMARY  Visits from Start of Care: 7  Current functional level related to goals / functional outcomes: See clinical impression and PT goals   Remaining deficits: See clinical impression and PT goals  Education / Equipment: HEP   Patient agrees to discharge. Patient goals were met. Patient is being discharged due to meeting the stated rehab goals.    Alissandra Geoffroy MS, PT 12/09/22 1:58 PM

## 2022-12-17 NOTE — Progress Notes (Signed)
Benito Mccreedy D.[place] Phone: [phone]   Assessment and Plan:     1. Chronic pain of right knee -Chronic with exacerbation, initial sports medicine visit - Most consistent with flare of osteoarthritis based on HPI, physical exam, x-ray imaging - X-ray obtained in clinic.  My interpretation: No acute fracture or dislocation.  Cortical irregularities most significant in patellofemoral compartment with patellar spurring and decreased joint space.  This is consistent with patient's crepitus on physical exam.  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  Tolerated well per note below.  Patient may have temporarily increase blood pressure and blood glucose with past medical history of hypertension and diabetes - Start HEP - Use Tylenol as needed for pain relief - DG Knee AP/LAT W/Sunrise Right; Future   Procedure: Knee Joint Injection Side: Right Indication: Flare of osteoarthritis  Risks explained and consent was given verbally. The site was cleaned with alcohol prep. A needle was introduced with an anterio-lateral approach. Injection given using 66m of 1% lidocaine without epinephrine and 161mof kenalog 4064ml. This was well tolerated and resulted in symptomatic relief.  Needle was removed, hemostasis achieved, and post injection instructions were explained.   Pt was advised to call or return to clinic if these symptoms worsen or fail to improve as anticipated.   Pertinent previous records reviewed include right knee x-ray 03/27/2017   Follow Up: 3 to 4 weeks for reevaluation.  If no improvement or worsening of symptoms, could consider physical therapy versus p.o. medication versus alternative injection   Subjective:   I, Moenique Parris, am serving as a scrEducation administratorr Doctor BenGlennon MacChief  Complaint: left hip pain    HPI:    10/06/2022 Patient is a 71 14ar old female complaining of left hip pain. Patient states fx of left hip end of sep/octo , she had a fall, she did PT while in hospital , no pain , when she walk alt she has pain in the back , low back n the left side pain, no radiating pain , she has a little pain where her fx site is,   12/18/2022 Patient states that she has right knee pain. No MOI, she was using tylenol, has had this on the left knee before, intermittent pain , been going on for 2 months ,     Relevant Historical Information: Hypertension, DM type II   Additional pertinent review of systems negative.   Current Outpatient Medications:    ACCU-CHEK GUIDE test strip, USE 1 STRIP ONCE DAILY, Disp: 100 each, Rfl: 7   Accu-Chek Softclix Lancets lancets, SMARTSIG:1 Topical Daily, Disp: 100 each, Rfl: 7   amLODipine (NORVASC) 10 MG tablet, Take 1 tablet (10 mg total) by mouth daily., Disp: 180 tablet, Rfl: 1   Blood Glucose Monitoring Suppl w/Device KIT, 1 Device by Does not apply route daily., Disp: 1 kit, Rfl: 0   budesonide-formoterol (SYMBICORT) 80-4.5 MCG/ACT inhaler, Inhale 2 puffs into the lungs 2 (two) times daily., Disp: 1 each, Rfl: 3   gabapentin (NEURONTIN) 300 MG capsule, Take 1 capsule by mouth at bedtime, Disp: 90 capsule, Rfl: 0   glimepiride (AMARYL) 2 MG tablet, Take 1 tablet (2 mg total) by mouth daily with breakfast. TAKE ONE TABLET BY MOUTH WITH BREAKFAST, Disp: 180 tablet, Rfl: 1   metFORMIN (GLUCOPHAGE-XR) 500  MG 24 hr tablet, Take 1 tablet (500 mg total) by mouth 2 (two) times daily with a meal., Disp: 360 tablet, Rfl: 1   Multiple Vitamin (MULTIVITAMIN) tablet, Take 1 tablet by mouth daily., Disp: , Rfl:    rosuvastatin (CRESTOR) 20 MG tablet, Take 1 tablet (20 mg total) by mouth daily., Disp: 180 tablet, Rfl: 1   Spacer/Aero-Holding Chambers (AEROCHAMBER PLUS) inhaler, Use as instructed, Disp: 1 each, Rfl: 2   sitaGLIPtin (JANUVIA) 50 MG  tablet, Take 1 tablet (50 mg total) by mouth daily., Disp: 180 tablet, Rfl: 1  Current Facility-Administered Medications:    0.9 %  sodium chloride infusion, 500 mL, Intravenous, Once, Danis, Estill Cotta III, MD   Objective:     Vitals:   12/18/22 1009  Pulse: 82  SpO2: 96%  Weight: 159 lb (72.1 kg)  Height: 5' 8"$  (1.727 m)      Body mass index is 24.18 kg/m.    Physical Exam:    General:  awake, alert oriented, no acute distress nontoxic Skin: no suspicious lesions or rashes Neuro:sensation intact and strength 5/5 with no deficits, no atrophy, normal muscle tone Psych: No signs of anxiety, depression or other mood disorder  Right knee: Mild swelling No deformity Positive fluid wave, joint milking Crepitus present ROM Flex 110, Ext 0 NTTP over the quad tendon, medial fem condyle, lat fem condyle, patella, plica, patella tendon, tibial tuberostiy, fibular head, posterior fossa, pes anserine bursa, gerdy's tubercle, medial jt line, lateral jt line Neg anterior and posterior drawer Neg lachman Neg sag sign Negative varus stress Negative valgus stress Negative McMurray, though reproduced anterior knee pain     Gait normal    Electronically signed by:  Benito Mccreedy D.[facility] 10:54 AM 12/18/22

## 2022-12-18 ENCOUNTER — Ambulatory Visit (INDEPENDENT_AMBULATORY_CARE_PROVIDER_SITE_OTHER): Payer: Medicaid Other | Admitting: Sports Medicine

## 2022-12-18 ENCOUNTER — Ambulatory Visit (INDEPENDENT_AMBULATORY_CARE_PROVIDER_SITE_OTHER): Payer: Medicaid Other

## 2022-12-18 VITALS — HR 82 | Ht 68.0 in | Wt 159.0 lb

## 2022-12-18 DIAGNOSIS — M1711 Unilateral primary osteoarthritis, right knee: Secondary | ICD-10-CM | POA: Diagnosis not present

## 2022-12-18 DIAGNOSIS — M25561 Pain in right knee: Secondary | ICD-10-CM

## 2022-12-18 DIAGNOSIS — G8929 Other chronic pain: Secondary | ICD-10-CM | POA: Diagnosis not present

## 2022-12-18 NOTE — Patient Instructions (Addendum)
Good to see you Knee HEP  3-4 week follow up

## 2023-01-06 NOTE — Progress Notes (Unsigned)
    Benito Mccreedy D.[place] Phone: [phone]   Assessment and Plan:     There are no diagnoses linked to this encounter.  ***   Pertinent previous records reviewed include ***   Follow Up: ***     Subjective:   I, Helga Asbury, am serving as a Education administrator for Doctor Glennon Mac   Chief Complaint: left hip pain    HPI:    10/06/2022 Patient is a 72 year old female complaining of left hip pain. Patient states fx of left hip end of sep/octo , she had a fall, she did PT while in hospital , no pain , when she walk alt she has pain in the back , low back n the left side pain, no radiating pain , she has a little pain where her fx site is,    12/18/2022 Patient states that she has right knee pain. No MOI, she was using tylenol, has had this on the left knee before, intermittent pain , been going on for 2 months ,    01/07/2023 Patient states    Relevant Historical Information: Hypertension, DM type II  Additional pertinent review of systems negative.   Current Outpatient Medications:    ACCU-CHEK GUIDE test strip, USE 1 STRIP ONCE DAILY, Disp: 100 each, Rfl: 7   Accu-Chek Softclix Lancets lancets, SMARTSIG:1 Topical Daily, Disp: 100 each, Rfl: 7   amLODipine (NORVASC) 10 MG tablet, Take 1 tablet (10 mg total) by mouth daily., Disp: 180 tablet, Rfl: 1   Blood Glucose Monitoring Suppl w/Device KIT, 1 Device by Does not apply route daily., Disp: 1 kit, Rfl: 0   budesonide-formoterol (SYMBICORT) 80-4.5 MCG/ACT inhaler, Inhale 2 puffs into the lungs 2 (two) times daily., Disp: 1 each, Rfl: 3   gabapentin (NEURONTIN) 300 MG capsule, Take 1 capsule by mouth at bedtime, Disp: 90 capsule, Rfl: 0   glimepiride (AMARYL) 2 MG tablet, Take 1 tablet (2 mg total) by mouth daily with breakfast. TAKE ONE TABLET BY MOUTH WITH BREAKFAST, Disp: 180 tablet, Rfl: 1   metFORMIN (GLUCOPHAGE-XR) 500 MG 24 hr tablet, Take 1  tablet (500 mg total) by mouth 2 (two) times daily with a meal., Disp: 360 tablet, Rfl: 1   Multiple Vitamin (MULTIVITAMIN) tablet, Take 1 tablet by mouth daily., Disp: , Rfl:    rosuvastatin (CRESTOR) 20 MG tablet, Take 1 tablet (20 mg total) by mouth daily., Disp: 180 tablet, Rfl: 1   sitaGLIPtin (JANUVIA) 50 MG tablet, Take 1 tablet (50 mg total) by mouth daily., Disp: 180 tablet, Rfl: 1   Spacer/Aero-Holding Chambers (AEROCHAMBER PLUS) inhaler, Use as instructed, Disp: 1 each, Rfl: 2  Current Facility-Administered Medications:    0.9 %  sodium chloride infusion, 500 mL, Intravenous, Once, Danis, Kirke Corin, MD   Objective:     There were no vitals filed for this visit.    There is no height or weight on file to calculate BMI.    Physical Exam:    ***   Electronically signed by:  Benito Mccreedy D.[facility] 8:39 AM 01/06/23

## 2023-01-07 ENCOUNTER — Ambulatory Visit (INDEPENDENT_AMBULATORY_CARE_PROVIDER_SITE_OTHER): Payer: Medicaid Other | Admitting: Sports Medicine

## 2023-01-07 VITALS — HR 63 | Ht 68.0 in | Wt 158.0 lb

## 2023-01-07 DIAGNOSIS — G8929 Other chronic pain: Secondary | ICD-10-CM

## 2023-01-07 DIAGNOSIS — M1711 Unilateral primary osteoarthritis, right knee: Secondary | ICD-10-CM | POA: Diagnosis not present

## 2023-01-07 DIAGNOSIS — M25561 Pain in right knee: Secondary | ICD-10-CM | POA: Diagnosis not present

## 2023-01-07 MED ORDER — MELOXICAM 15 MG PO TABS: 15.0000 mg | ORAL_TABLET | Freq: Every day | ORAL | 0 refills | Status: DC

## 2023-01-07 NOTE — Patient Instructions (Addendum)
Good to see you - Start meloxicam 15 mg As needed  We gave you a steroid  injection (1 ml kenalog, 2 ml lidocaine)  in your knee on 12/18/2022 this could be repeated after 03/18/2023 As needed follow up

## 2023-02-12 ENCOUNTER — Telehealth: Payer: Self-pay | Admitting: Emergency Medicine

## 2023-02-12 ENCOUNTER — Other Ambulatory Visit: Payer: Self-pay | Admitting: *Deleted

## 2023-02-12 DIAGNOSIS — I152 Hypertension secondary to endocrine disorders: Secondary | ICD-10-CM

## 2023-02-12 MED ORDER — BLOOD GLUCOSE MONITORING SUPPL W/DEVICE KIT: 1.0000 | PACK | Freq: Every day | 0 refills | Status: DC

## 2023-02-12 MED ORDER — ACCU-CHEK GUIDE VI STRP: ORAL_STRIP | 7 refills | Status: DC

## 2023-02-12 NOTE — Telephone Encounter (Signed)
Patient son called asked if a prescription for a diabetic monitor and the strips be sent in for the patient. Patient has a scheduled appointment for 02/17/23 can discuss then if needed.

## 2023-02-12 NOTE — Telephone Encounter (Signed)
New prescription sent to patient pharmacy.

## 2023-02-17 ENCOUNTER — Ambulatory Visit (INDEPENDENT_AMBULATORY_CARE_PROVIDER_SITE_OTHER): Payer: Medicaid Other | Admitting: Emergency Medicine

## 2023-02-17 ENCOUNTER — Encounter: Payer: Self-pay | Admitting: Emergency Medicine

## 2023-02-17 VITALS — BP 122/82 | HR 70 | Temp 98.2°F | Ht 68.0 in | Wt 156.4 lb

## 2023-02-17 DIAGNOSIS — J479 Bronchiectasis, uncomplicated: Secondary | ICD-10-CM

## 2023-02-17 DIAGNOSIS — J984 Other disorders of lung: Secondary | ICD-10-CM

## 2023-02-17 DIAGNOSIS — I152 Hypertension secondary to endocrine disorders: Secondary | ICD-10-CM

## 2023-02-17 DIAGNOSIS — E1169 Type 2 diabetes mellitus with other specified complication: Secondary | ICD-10-CM | POA: Diagnosis not present

## 2023-02-17 DIAGNOSIS — R202 Paresthesia of skin: Secondary | ICD-10-CM | POA: Diagnosis not present

## 2023-02-17 DIAGNOSIS — E1159 Type 2 diabetes mellitus with other circulatory complications: Secondary | ICD-10-CM | POA: Diagnosis not present

## 2023-02-17 DIAGNOSIS — E785 Hyperlipidemia, unspecified: Secondary | ICD-10-CM | POA: Diagnosis not present

## 2023-02-17 LAB — COMPREHENSIVE METABOLIC PANEL
ALT: 16 U/L (ref 0–35)
AST: 18 U/L (ref 0–37)
Albumin: 4.4 g/dL (ref 3.5–5.2)
Alkaline Phosphatase: 79 U/L (ref 39–117)
BUN: 8 mg/dL (ref 6–23)
CO2: 32 mEq/L (ref 19–32)
Calcium: 9.5 mg/dL (ref 8.4–10.5)
Chloride: 98 mEq/L (ref 96–112)
Creatinine, Ser: 0.58 mg/dL (ref 0.40–1.20)
GFR: 91.79 mL/min (ref >60.00)
Glucose, Bld: 123 mg/dL — ABNORMAL HIGH (ref 70–99)
Potassium: 3.7 mEq/L (ref 3.5–5.1)
Sodium: 137 mEq/L (ref 135–145)
Total Bilirubin: 0.3 mg/dL (ref 0.2–1.2)
Total Protein: 7.9 g/dL (ref 6.0–8.3)

## 2023-02-17 LAB — LIPID PANEL
Cholesterol: 163 mg/dL (ref 0–200)
HDL: 55.1 mg/dL (ref >39.00)
LDL Cholesterol: 93 mg/dL (ref 0–99)
NonHDL: 108.36
Total CHOL/HDL Ratio: 3
Triglycerides: 78 mg/dL (ref 0.0–149.0)
VLDL: 15.6 mg/dL (ref 0.0–40.0)

## 2023-02-17 LAB — CBC WITH DIFFERENTIAL/PLATELET
Basophils Absolute: 0.1 10*3/uL (ref 0.0–0.1)
Basophils Relative: 0.9 % (ref 0.0–3.0)
Eosinophils Absolute: 0.3 10*3/uL (ref 0.0–0.7)
Eosinophils Relative: 3.7 % (ref 0.0–5.0)
HCT: 41.3 % (ref 36.0–46.0)
Hemoglobin: 13.7 g/dL (ref 12.0–15.0)
Lymphocytes Relative: 33.6 % (ref 12.0–46.0)
Lymphs Abs: 2.6 10*3/uL (ref 0.7–4.0)
MCHC: 33.1 g/dL (ref 30.0–36.0)
MCV: 87.9 fl (ref 78.0–100.0)
Monocytes Absolute: 0.6 10*3/uL (ref 0.1–1.0)
Monocytes Relative: 8.2 % (ref 3.0–12.0)
Neutro Abs: 4.2 10*3/uL (ref 1.4–7.7)
Neutrophils Relative %: 53.6 % (ref 43.0–77.0)
Platelets: 360 10*3/uL (ref 150.0–400.0)
RBC: 4.69 Mil/uL (ref 3.87–5.11)
RDW: 13.7 % (ref 11.5–15.5)
WBC: 7.8 10*3/uL (ref 4.0–10.5)

## 2023-02-17 LAB — POCT GLYCOSYLATED HEMOGLOBIN (HGB A1C): Hemoglobin A1C: 6.6 % — AB (ref 4.0–5.6)

## 2023-02-17 MED ORDER — GABAPENTIN 300 MG PO CAPS: 300.0000 mg | ORAL_CAPSULE | Freq: Every day | ORAL | 0 refills | Status: DC

## 2023-02-17 MED ORDER — ACCU-CHEK SOFTCLIX LANCETS MISC
7 refills | Status: DC
Start: 2023-02-17 — End: 2024-03-27

## 2023-02-17 MED ORDER — ACCU-CHEK GUIDE VI STRP: ORAL_STRIP | 7 refills | Status: DC

## 2023-02-17 MED ORDER — SITAGLIPTIN PHOSPHATE 50 MG PO TABS
50.0000 mg | ORAL_TABLET | Freq: Every day | ORAL | 1 refills | Status: DC
Start: 2023-02-17 — End: 2023-08-31

## 2023-02-17 MED ORDER — ROSUVASTATIN CALCIUM 20 MG PO TABS
20.0000 mg | ORAL_TABLET | Freq: Every day | ORAL | 1 refills | Status: DC
Start: 2023-02-17 — End: 2023-08-31

## 2023-02-17 MED ORDER — AMLODIPINE BESYLATE 10 MG PO TABS: 10.0000 mg | ORAL_TABLET | Freq: Every day | ORAL | 1 refills | Status: DC

## 2023-02-17 MED ORDER — BLOOD GLUCOSE MONITORING SUPPL W/DEVICE KIT: 1.0000 | PACK | Freq: Every day | 0 refills | Status: DC

## 2023-02-17 MED ORDER — GLIMEPIRIDE 2 MG PO TABS: 2.0000 mg | ORAL_TABLET | Freq: Every day | ORAL | 1 refills | Status: DC

## 2023-02-17 MED ORDER — METFORMIN HCL ER 500 MG PO TB24
500.0000 mg | ORAL_TABLET | Freq: Two times a day (BID) | ORAL | 1 refills | Status: DC
Start: 2023-02-17 — End: 2023-08-31

## 2023-02-17 NOTE — Assessment & Plan Note (Signed)
Chronic stable condition Diet and nutrition discussed Lipid profile done today Continue rosuvastatin 20 mg daily The 10-year ASCVD risk score (Arnett DK, et al., 2019) is: 20.3%   Values used to calculate the score:     Age: 72 years     Sex: Female     Is Non-Hispanic African American: No     Diabetic: Yes     Tobacco smoker: No     Systolic Blood Pressure: 122 mmHg     Is BP treated: Yes     HDL Cholesterol: 52.1 mg/dL     Total Cholesterol: 168 mg/dL

## 2023-02-17 NOTE — Assessment & Plan Note (Signed)
Well-controlled hypertension Continue amlodipine 10 mg daily Well-controlled diabetes with hemoglobin A1c of 6.6 Continue Januvia 50 mg daily, metformin 500 mg twice a day and glimepiride 2 mg daily Cardiovascular risks associated with hypertension and diabetes discussed Diet and nutrition discussed Follow-up in 6 months

## 2023-02-17 NOTE — Progress Notes (Signed)
Cheryl Hopkins 72 y.o.   Chief Complaint  Patient presents with   Medical Management of Chronic Issues    F/u appt, no concerns     HISTORY OF PRESENT ILLNESS: This is a 72 y.o. female A1A here for 78-month follow-up of chronic medical conditions Accompanied by interpreter Overall doing well.  Has no complaints or medical concerns today.  HPI   Prior to Admission medications   Medication Sig Start Date End Date Taking? Authorizing Provider  budesonide-formoterol (SYMBICORT) 80-4.5 MCG/ACT inhaler Inhale 2 puffs into the lungs 2 (two) times daily. 06/25/22  Yes Dessa Ledee, Eilleen Kempf, MD  Multiple Vitamin (MULTIVITAMIN) tablet Take 1 tablet by mouth daily.   Yes [provider]  Spacer/Aero-Holding Chambers (AEROCHAMBER PLUS) inhaler Use as instructed 04/23/21  Yes Adylynn Hertenstein, Eilleen Kempf, MD  ACCU-CHEK GUIDE test strip USE 1 STRIP ONCE DAILY 02/17/23   Georgina Quint, MD  Accu-Chek Softclix Lancets lancets SMARTSIG:1 Topical Daily 02/17/23   Georgina Quint, MD  amLODipine (NORVASC) 10 MG tablet Take 1 tablet (10 mg total) by mouth daily. 02/17/23 02/12/24  Georgina Quint, MD  Blood Glucose Monitoring Suppl w/Device KIT 1 Device by Does not apply route daily. 02/17/23   Georgina Quint, MD  gabapentin (NEURONTIN) 300 MG capsule Take 1 capsule (300 mg total) by mouth at bedtime. 02/17/23   Georgina Quint, MD  glimepiride (AMARYL) 2 MG tablet Take 1 tablet (2 mg total) by mouth daily with breakfast. TAKE ONE TABLET BY MOUTH WITH BREAKFAST 02/17/23 02/12/24  Georgina Quint, MD  metFORMIN (GLUCOPHAGE-XR) 500 MG 24 hr tablet Take 1 tablet (500 mg total) by mouth 2 (two) times daily with a meal. 02/17/23 02/12/24  Aubriee Szeto, Eilleen Kempf, MD  rosuvastatin (CRESTOR) 20 MG tablet Take 1 tablet (20 mg total) by mouth daily. 02/17/23 02/12/24  Georgina Quint, MD  sitaGLIPtin (JANUVIA) 50 MG tablet Take 1 tablet (50 mg total) by mouth daily. 02/17/23 02/12/24   Georgina Quint, MD    No Known Allergies  Patient Active Problem List   Diagnosis Date Noted   Bilateral leg pain 05/20/2021   Paresthesia of both lower extremities 05/20/2021   History of simple renal cyst 04/23/2021   Bronchiectasis without complication 08/14/2020   Language barrier 08/14/2020   Mixed conductive and sensorineural hearing loss of left ear with restricted hearing of right ear 06/19/2020   Solitary pulmonary nodule 03/05/2020   Chronic pain of left knee 03/29/2019   Chronic lung disease 10/31/2015   Hypertension associated with diabetes 10/31/2015   Dyslipidemia associated with type 2 diabetes mellitus 09/30/2015   Hyperlipidemia 09/30/2015    Past Medical History:  Diagnosis Date   Arthritis    knee   Bronchitis    Diabetes mellitus without complication (HCC)    Several years.   Hyperlipidemia 09/30/2015   Hypertension    Stomach pain    with diarrhea    Past Surgical History:  Procedure Laterality Date   COLONOSCOPY      Social History   Socioeconomic History   Marital status: Widowed    Spouse name: Widowed since 2010   Number of children: 9   Years of education: <8   Highest education level: Not on file  Occupational History   Occupation: Housewife  Tobacco Use   Smoking status: Never   Smokeless tobacco: Never   Tobacco comments:    Chews cola nut  Vaping Use   Vaping Use: Never used  Substance and Sexual  Activity   Alcohol use: No    Alcohol/week: 0.0 standard drinks of alcohol   Drug use: No   Sexual activity: Not Currently  Other Topics Concern   Not on file  Social History Narrative      Widow   Originally from [place] - Came to [employer]. In 2016   Lives with son and his wife and their 3 children.        Exercise - no exercise      Seatbelt - 100%   Gun in home - no   Social Determinants of Health   Financial Resource Strain: Not on file  Food Insecurity: Not on file  Transportation Needs: Not on file  Physical  Activity: Not on file  Stress: Not on file  Social Connections: Not on file  Intimate Partner Violence: Not on file    Family History  Problem Relation Age of Onset   Bronchitis Sister    Hypertension Sister    Stroke Sister    Hepatitis Daughter    Colon cancer Neg Hx    Esophageal cancer Neg Hx    Rectal cancer Neg Hx    Stomach cancer Neg Hx      Review of Systems  Constitutional: Negative.  Negative for chills and fever.  HENT: Negative.  Negative for congestion and sore throat.   Respiratory: Negative.  Negative for cough and shortness of breath.   Cardiovascular: Negative.  Negative for chest pain and palpitations.  Gastrointestinal:  Negative for abdominal pain, diarrhea, nausea and vomiting.  Genitourinary: Negative.  Negative for dysuria and hematuria.  Skin: Negative.  Negative for rash.  Neurological:  Negative for dizziness and headaches.  All other systems reviewed and are negative.   Vitals:   02/17/23 1501  BP: 122/82  Pulse: 70  Temp: 98.2 F (36.8 C)  SpO2: 95%    Physical Exam Vitals reviewed.  Constitutional:      Appearance: Normal appearance.  HENT:     Head: Normocephalic.     Mouth/Throat:     Mouth: Mucous membranes are moist.     Pharynx: Oropharynx is clear.  Eyes:     Extraocular Movements: Extraocular movements intact.     Conjunctiva/sclera: Conjunctivae normal.     Pupils: Pupils are equal, round, and reactive to light.  Cardiovascular:     Rate and Rhythm: Normal rate and regular rhythm.     Pulses: Normal pulses.     Heart sounds: Normal heart sounds.  Pulmonary:     Effort: Pulmonary effort is normal.     Breath sounds: Normal breath sounds.  Abdominal:     Palpations: Abdomen is soft.     Tenderness: There is no abdominal tenderness.  Musculoskeletal:     Cervical back: No tenderness.  Lymphadenopathy:     Cervical: No cervical adenopathy.  Skin:    General: Skin is warm and dry.     Capillary Refill: Capillary  refill takes less than 2 seconds.  Neurological:     General: No focal deficit present.     Mental Status: She is alert and oriented to person, place, and time.  Psychiatric:        Mood and Affect: Mood normal.        Behavior: Behavior normal.    Results for orders placed or performed in visit on 02/17/23 (from the past 24 hour(s))  POCT HgB A1C     Status: Abnormal   Collection Time: 02/17/23  3:45 PM  Result  Value Ref Range   Hemoglobin A1C 6.6 (A) 4.0 - 5.6 %   HbA1c POC (<> result, manual entry)     HbA1c, POC (prediabetic range)     HbA1c, POC (controlled diabetic range)       ASSESSMENT & PLAN: A total of 48 minutes was spent with the patient and counseling/coordination of care regarding preparing for this visit, review of most recent office visit notes, review of multiple chronic medical conditions under management, review of all medications, review of most recent blood work results, education on nutrition, review of health maintenance items, prognosis, documentation, and need for follow-up.  Problem List Items Addressed This Visit       Cardiovascular and Mediastinum   Hypertension associated with diabetes - Primary    Well-controlled hypertension Continue amlodipine 10 mg daily Well-controlled diabetes with hemoglobin A1c of 6.6 Continue Januvia 50 mg daily, metformin 500 mg twice a day and glimepiride 2 mg daily Cardiovascular risks associated with hypertension and diabetes discussed Diet and nutrition discussed Follow-up in 6 months      Relevant Medications   amLODipine (NORVASC) 10 MG tablet   glimepiride (AMARYL) 2 MG tablet   metFORMIN (GLUCOPHAGE-XR) 500 MG 24 hr tablet   rosuvastatin (CRESTOR) 20 MG tablet   sitaGLIPtin (JANUVIA) 50 MG tablet   ACCU-CHEK GUIDE test strip   Accu-Chek Softclix Lancets lancets   Other Relevant Orders   POCT HgB A1C (Completed)   CBC with Differential/Platelet   Comprehensive metabolic panel   Lipid panel      Respiratory   Chronic lung disease   Bronchiectasis without complication    Stable chronic condition.  No complications.  No infections.  No concerns at present time. Continue Symbicort twice a day        Endocrine   Dyslipidemia associated with type 2 diabetes mellitus    Chronic stable condition Diet and nutrition discussed Lipid profile done today Continue rosuvastatin 20 mg daily The 10-year ASCVD risk score (Arnett DK, et al., 2019) is: 20.3%   Values used to calculate the score:     Age: 34 years     Sex: Female     Is Non-Hispanic African American: No     Diabetic: Yes     Tobacco smoker: No     Systolic Blood Pressure: 122 mmHg     Is BP treated: Yes     HDL Cholesterol: 52.1 mg/dL     Total Cholesterol: 168 mg/dL       Relevant Medications   glimepiride (AMARYL) 2 MG tablet   metFORMIN (GLUCOPHAGE-XR) 500 MG 24 hr tablet   rosuvastatin (CRESTOR) 20 MG tablet   sitaGLIPtin (JANUVIA) 50 MG tablet   Other Relevant Orders   POCT HgB A1C (Completed)   CBC with Differential/Platelet   Comprehensive metabolic panel   Lipid panel     Other   Paresthesia of both lower extremities   Relevant Medications   gabapentin (NEURONTIN) 300 MG capsule   Patient Instructions  Health Maintenance After Age 38 After age 66, you are at a higher risk for certain long-term diseases and infections as well as injuries from falls. Falls are a major cause of broken bones and head injuries in people who are older than age 69. Getting regular preventive care can help to keep you healthy and well. Preventive care includes getting regular testing and making lifestyle changes as recommended by your health care provider. Talk with your health care provider about: Which screenings and tests you  should have. A screening is a test that checks for a disease when you have no symptoms. A diet and exercise plan that is right for you. What should I know about screenings and tests to prevent  falls? Screening and testing are the best ways to find a health problem early. Early diagnosis and treatment give you the best chance of managing medical conditions that are common after age 87. Certain conditions and lifestyle choices may make you more likely to have a fall. Your health care provider may recommend: Regular vision checks. Poor vision and conditions such as cataracts can make you more likely to have a fall. If you wear glasses, make sure to get your prescription updated if your vision changes. Medicine review. Work with your health care provider to regularly review all of the medicines you are taking, including over-the-counter medicines. Ask your health care provider about any side effects that may make you more likely to have a fall. Tell your health care provider if any medicines that you take make you feel dizzy or sleepy. Strength and balance checks. Your health care provider may recommend certain tests to check your strength and balance while standing, walking, or changing positions. Foot health exam. Foot pain and numbness, as well as not wearing proper footwear, can make you more likely to have a fall. Screenings, including: Osteoporosis screening. Osteoporosis is a condition that causes the bones to get weaker and break more easily. Blood pressure screening. Blood pressure changes and medicines to control blood pressure can make you feel dizzy. Depression screening. You may be more likely to have a fall if you have a fear of falling, feel depressed, or feel unable to do activities that you used to do. Alcohol use screening. Using too much alcohol can affect your balance and may make you more likely to have a fall. Follow these instructions at home: Lifestyle Do not drink alcohol if: Your health care provider tells you not to drink. If you drink alcohol: Limit how much you have to: 0-1 drink a day for women. 0-2 drinks a day for men. Know how much alcohol is in your drink.  In the [place] one drink equals one 12 oz bottle of beer (355 mL), one 5 oz glass of wine (148 mL), or one 1 oz glass of hard liquor (44 mL). Do not use any products that contain nicotine or tobacco. These products include cigarettes, chewing tobacco, and vaping devices, such as e-cigarettes. If you need help quitting, ask your health care provider. Activity  Follow a regular exercise program to stay fit. This will help you maintain your balance. Ask your health care provider what types of exercise are appropriate for you. If you need a cane or walker, use it as recommended by your health care provider. Wear supportive shoes that have nonskid soles. Safety  Remove any tripping hazards, such as rugs, cords, and clutter. Install safety equipment such as grab bars in bathrooms and safety rails on stairs. Keep rooms and walkways well-lit. General instructions Talk with your health care provider about your risks for falling. Tell your health care provider if: You fall. Be sure to tell your health care provider about all falls, even ones that seem minor. You feel dizzy, tiredness (fatigue), or off-balance. Take over-the-counter and prescription medicines only as told by your health care provider. These include supplements. Eat a healthy diet and maintain a healthy weight. A healthy diet includes low-fat dairy products, low-fat (lean) meats, and fiber from whole grains,  beans, and lots of fruits and vegetables. Stay current with your vaccines. Schedule regular health, dental, and eye exams. Summary Having a healthy lifestyle and getting preventive care can help to protect your health and wellness after age 59. Screening and testing are the best way to find a health problem early and help you avoid having a fall. Early diagnosis and treatment give you the best chance for managing medical conditions that are more common for people who are older than age 84. Falls are a major cause of broken bones and  head injuries in people who are older than age 105. Take precautions to prevent a fall at home. Work with your health care provider to learn what changes you can make to improve your health and wellness and to prevent falls. This information is not intended to replace advice given to you by your health care provider. Make sure you discuss any questions you have with your health care provider. Document Revised: 03/10/2021 Document Reviewed: 03/10/2021 Elsevier Patient Education  2023 Elsevier Inc.    Edwina Barth, MD [facility]

## 2023-02-17 NOTE — Assessment & Plan Note (Signed)
Stable chronic condition.  No complications.  No infections.  No concerns at present time. Continue Symbicort twice a day

## 2023-02-17 NOTE — Patient Instructions (Signed)
Health Maintenance After Age 70 After age 70, you are at a higher risk for certain long-term diseases and infections as well as injuries from falls. Falls are a major cause of broken bones and head injuries in people who are older than age 70. Getting regular preventive care can help to keep you healthy and well. Preventive care includes getting regular testing and making lifestyle changes as recommended by your health care provider. Talk with your health care provider about: Which screenings and tests you should have. A screening is a test that checks for a disease when you have no symptoms. A diet and exercise plan that is right for you. What should I know about screenings and tests to prevent falls? Screening and testing are the best ways to find a health problem early. Early diagnosis and treatment give you the best chance of managing medical conditions that are common after age 70. Certain conditions and lifestyle choices may make you more likely to have a fall. Your health care provider may recommend: Regular vision checks. Poor vision and conditions such as cataracts can make you more likely to have a fall. If you wear glasses, make sure to get your prescription updated if your vision changes. Medicine review. Work with your health care provider to regularly review all of the medicines you are taking, including over-the-counter medicines. Ask your health care provider about any side effects that may make you more likely to have a fall. Tell your health care provider if any medicines that you take make you feel dizzy or sleepy. Strength and balance checks. Your health care provider may recommend certain tests to check your strength and balance while standing, walking, or changing positions. Foot health exam. Foot pain and numbness, as well as not wearing proper footwear, can make you more likely to have a fall. Screenings, including: Osteoporosis screening. Osteoporosis is a condition that causes  the bones to get weaker and break more easily. Blood pressure screening. Blood pressure changes and medicines to control blood pressure can make you feel dizzy. Depression screening. You may be more likely to have a fall if you have a fear of falling, feel depressed, or feel unable to do activities that you used to do. Alcohol use screening. Using too much alcohol can affect your balance and may make you more likely to have a fall. Follow these instructions at home: Lifestyle Do not drink alcohol if: Your health care provider tells you not to drink. If you drink alcohol: Limit how much you have to: 0-1 drink a day for women. 0-2 drinks a day for men. Know how much alcohol is in your drink. In the [place] one drink equals one 12 oz bottle of beer (355 mL), one 5 oz glass of wine (148 mL), or one 1 oz glass of hard liquor (44 mL). Do not use any products that contain nicotine or tobacco. These products include cigarettes, chewing tobacco, and vaping devices, such as e-cigarettes. If you need help quitting, ask your health care provider. Activity  Follow a regular exercise program to stay fit. This will help you maintain your balance. Ask your health care provider what types of exercise are appropriate for you. If you need a cane or walker, use it as recommended by your health care provider. Wear supportive shoes that have nonskid soles. Safety  Remove any tripping hazards, such as rugs, cords, and clutter. Install safety equipment such as grab bars in bathrooms and safety rails on stairs. Keep rooms and walkways   well-lit. General instructions Talk with your health care provider about your risks for falling. Tell your health care provider if: You fall. Be sure to tell your health care provider about all falls, even ones that seem minor. You feel dizzy, tiredness (fatigue), or off-balance. Take over-the-counter and prescription medicines only as told by your health care provider. These include  supplements. Eat a healthy diet and maintain a healthy weight. A healthy diet includes low-fat dairy products, low-fat (lean) meats, and fiber from whole grains, beans, and lots of fruits and vegetables. Stay current with your vaccines. Schedule regular health, dental, and eye exams. Summary Having a healthy lifestyle and getting preventive care can help to protect your health and wellness after age 70. Screening and testing are the best way to find a health problem early and help you avoid having a fall. Early diagnosis and treatment give you the best chance for managing medical conditions that are more common for people who are older than age 70. Falls are a major cause of broken bones and head injuries in people who are older than age 70. Take precautions to prevent a fall at home. Work with your health care provider to learn what changes you can make to improve your health and wellness and to prevent falls. This information is not intended to replace advice given to you by your health care provider. Make sure you discuss any questions you have with your health care provider. Document Revised: 03/10/2021 Document Reviewed: 03/10/2021 Elsevier Patient Education  2023 Elsevier Inc.  

## 2023-02-18 NOTE — Progress Notes (Signed)
Could not LVM mail box full. 1 attempt

## 2023-05-29 ENCOUNTER — Other Ambulatory Visit: Payer: Self-pay | Admitting: Emergency Medicine

## 2023-05-29 DIAGNOSIS — R202 Paresthesia of skin: Secondary | ICD-10-CM

## 2023-06-06 IMAGING — CT CT ANGIO CHEST
2 of 7 series · 17 of 46 positions shown · IV contrast (APPLIED)
Comparison: Plain film from earlier in the same day, chest from
04/24/2020

CLINICAL DATA: Cough and chest and abdominal pain

EXAM:
CT ANGIOGRAPHY CHEST
CT ABDOMEN AND PELVIS WITH CONTRAST
TECHNIQUE: Multidetector CT imaging of the chest was performed using the
standard protocol during bolus administration of intravenous
contrast. Multiplanar CT image reconstructions and MIPs were
obtained to evaluate the vascular anatomy. Multidetector CT imaging
of the abdomen and pelvis was performed using the standard protocol
during bolus administration of intravenous contrast.
CONTRAST:  100mL OMNIPAQUE IOHEXOL 350 MG/ML SOLN

[Series 5: thins · axial · 0.83mm/px · z∈[+190,+491]mm · 14 of 485 slices shown]
[im 27/485  lung]
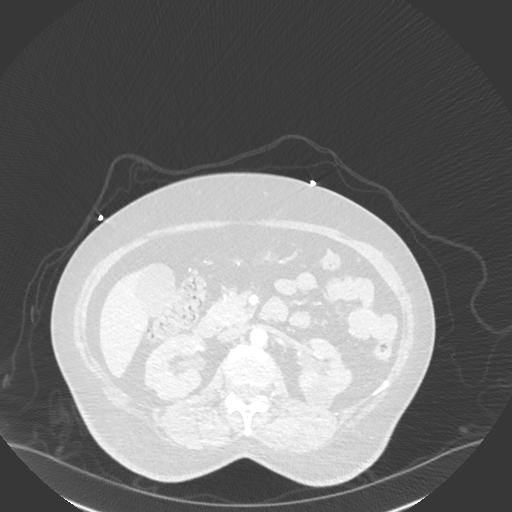
[im 54/485  soft-tissue]
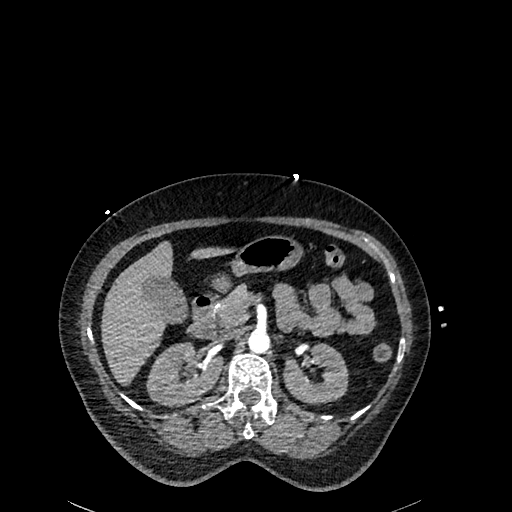
[im 108/485  lung]
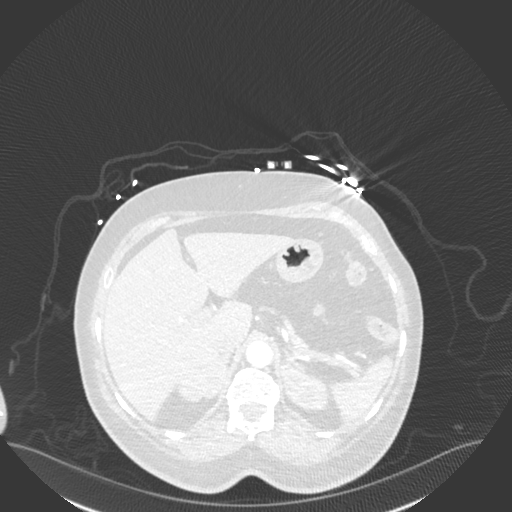
[im 135/485  soft-tissue]
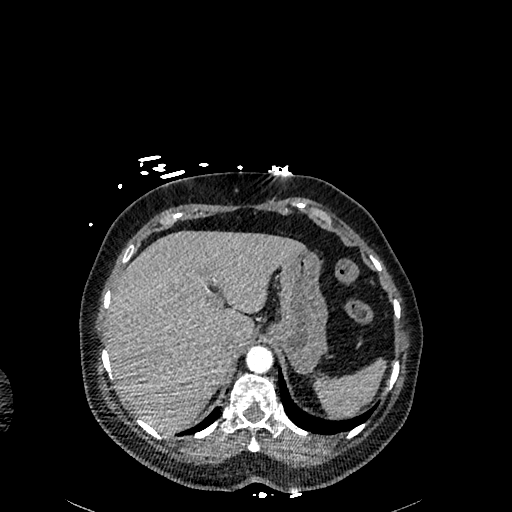
[im 162/485  lung]
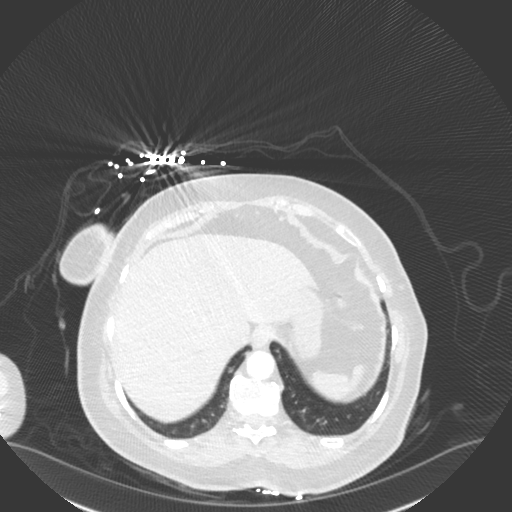
[im 189/485  soft-tissue]
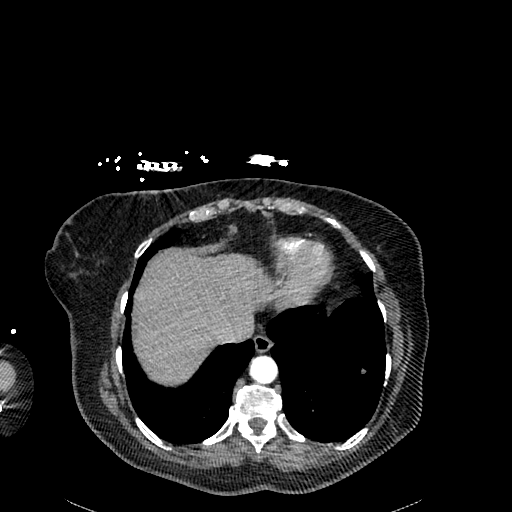
[im 216/485  lung]
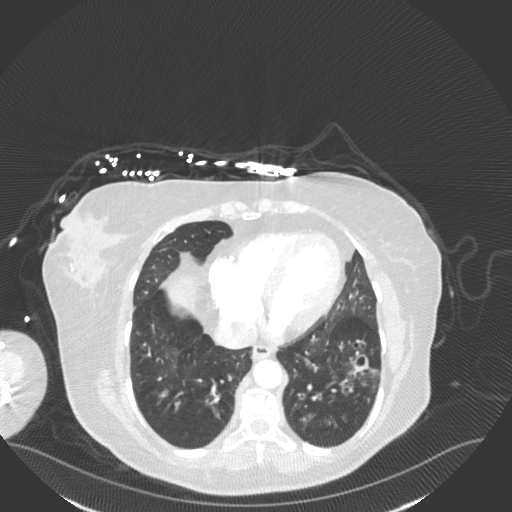
[im 269/485  soft-tissue]
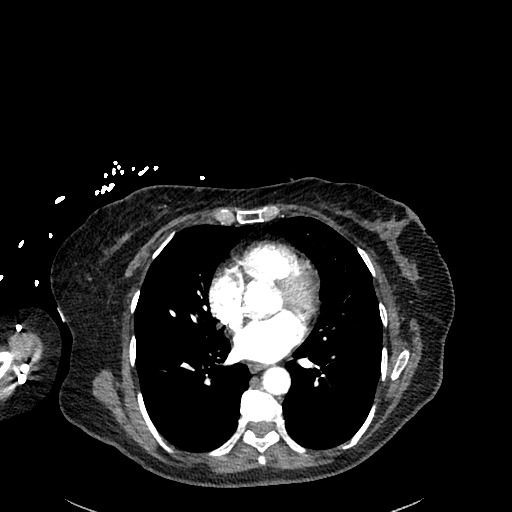
[im 296/485  lung]
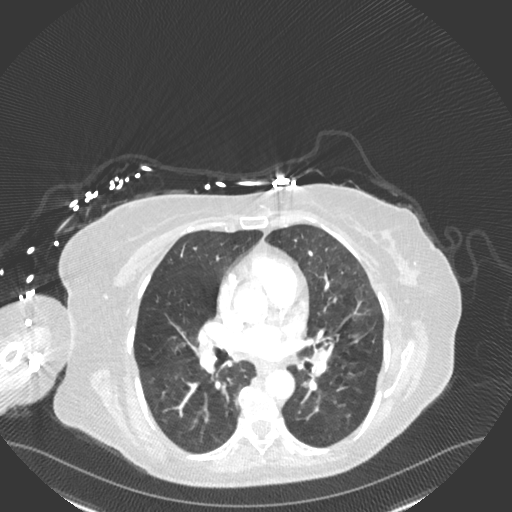
[im 323/485  soft-tissue]
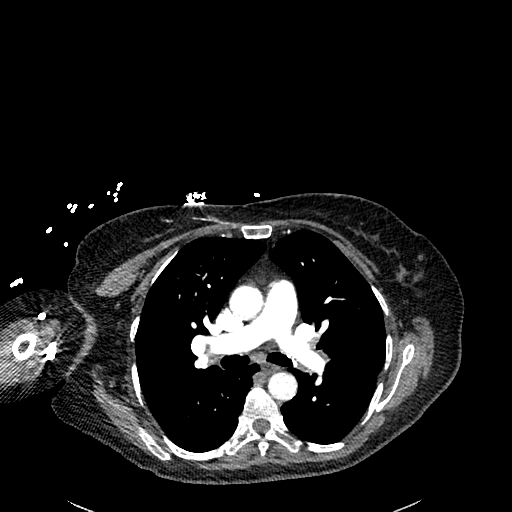
[im 350/485  lung]
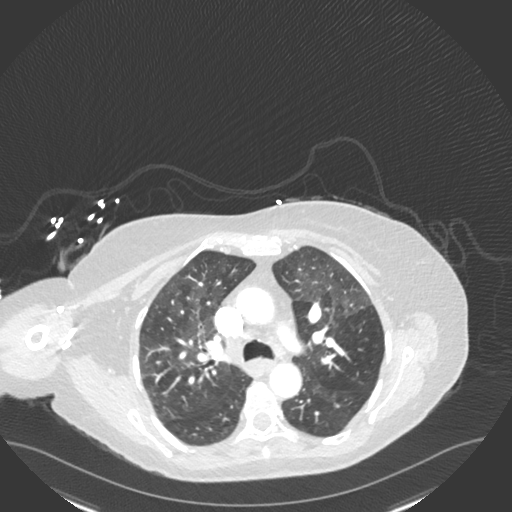
[im 377/485  soft-tissue]
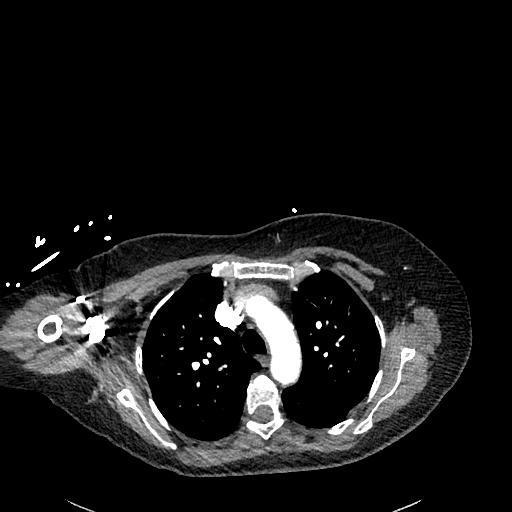
[im 431/485  lung]
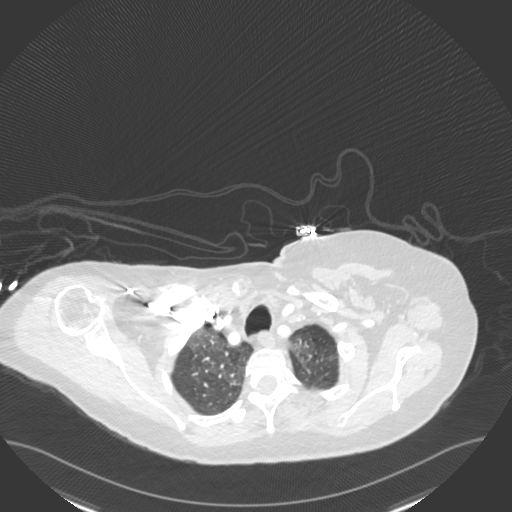
[im 458/485  soft-tissue]
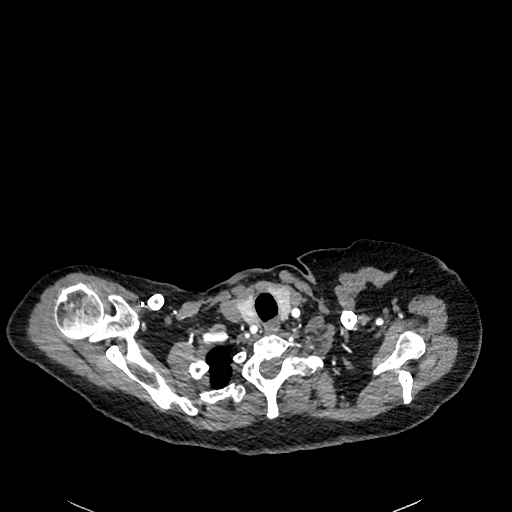

[Series 6: cor · coronal · 0.58mm/px · 3 of 145 slices shown]
[im 37/145  soft-tissue]
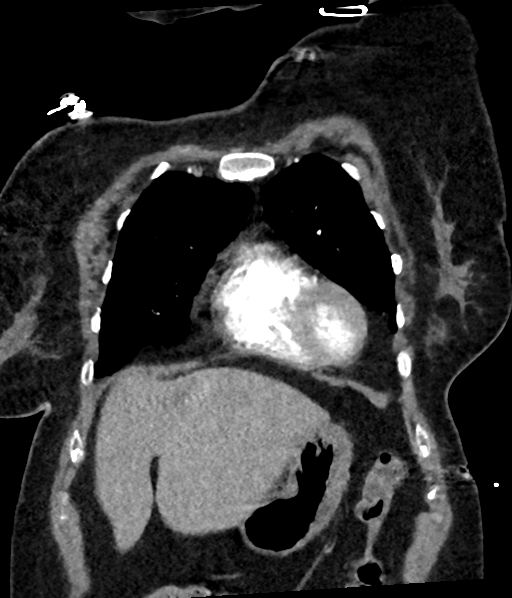
[im 73/145  soft-tissue]
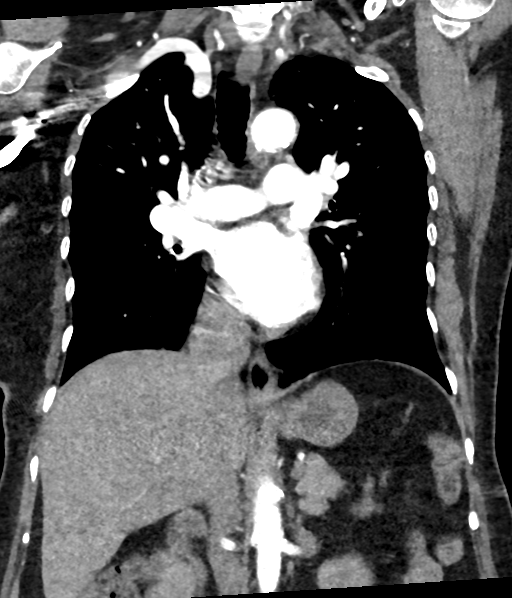
[im 109/145  soft-tissue]
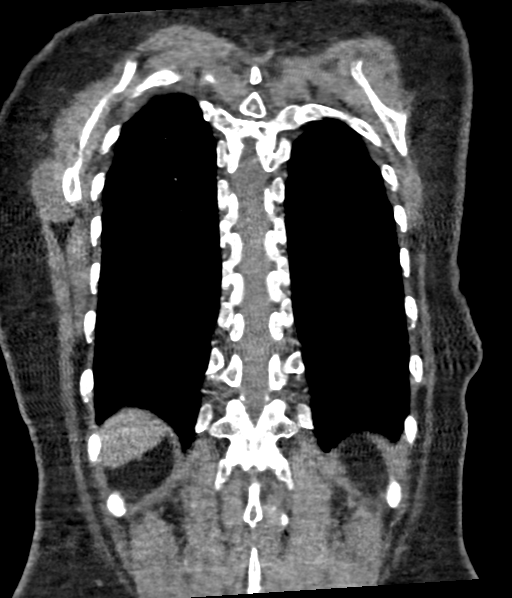

[17 of 46 positions shown; findings below may reference images not displayed]

FINDINGS: CTA CHEST FINDINGS

Cardiovascular: Thoracic aorta demonstrates atherosclerotic
calcifications although no aneurysmal dilatation or dissection is
noted. No cardiac enlargement is seen. Pulmonary artery is well
visualized. No focal filling defect to suggest pulmonary embolism is
noted. Mild coronary calcifications are noted.

Mediastinum/Nodes: Thoracic inlet is within normal limits. No
sizable hilar or mediastinal adenopathy is noted. The esophagus as
visualized is within normal limits.

Lungs/Pleura: Patchy mosaic attenuation is noted throughout both
lungs consistent with air trapping. Mild emphysematous changes are
seen. Changes of cystic bronchiectasis are noted worst in the left
lower lobe stable in appearance the prior exam. No new focal
infiltrate or sizable effusion is seen.

Musculoskeletal: Degenerative changes of the thoracic spine are
noted. No acute rib abnormality is noted.

Review of the MIP images confirms the above findings.

CT ABDOMEN and PELVIS FINDINGS

Hepatobiliary: No focal liver abnormality is seen. No gallstones,
gallbladder wall thickening, or biliary dilatation.

Pancreas: Unremarkable. No pancreatic ductal dilatation or
surrounding inflammatory changes.

Spleen: Normal in size without focal abnormality.

Adrenals/Urinary Tract: Adrenal glands are within normal limits.
Kidneys demonstrate a normal enhancement pattern bilaterally.
Scattered renal cysts are seen. No renal calculi or obstructive
changes are noted. Normal excretion of contrast is noted. Bladder is
partially distended.

Stomach/Bowel: Scattered diverticular changes noted without evidence
of diverticulitis. More proximal colon appears within normal limits.
The appendix is unremarkable. Small bowel and stomach are within
normal limits.

Vascular/Lymphatic: Aortic atherosclerosis. No enlarged abdominal or
pelvic lymph nodes.

Reproductive: Uterus and bilateral adnexa are unremarkable.

Other: No abdominal wall hernia or abnormality. No abdominopelvic
ascites.

Musculoskeletal: No acute or significant osseous findings.

Review of the MIP images confirms the above findings.
IMPRESSION: CTA of the chest: No acute pulmonary emboli are seen.

Changes in the lungs consistent with air trapping and emphysema.
Cystic bronchiectasis is noted in the lower lobes worst on the left
stable in appearance from the prior exam.

CT of the abdomen and pelvis: No acute abnormality noted. Mild
diverticular change of the colon is seen.

## 2023-08-31 ENCOUNTER — Other Ambulatory Visit: Payer: Self-pay

## 2023-08-31 ENCOUNTER — Encounter: Payer: Self-pay | Admitting: Emergency Medicine

## 2023-08-31 ENCOUNTER — Ambulatory Visit (INDEPENDENT_AMBULATORY_CARE_PROVIDER_SITE_OTHER): Payer: Medicaid Other | Admitting: Emergency Medicine

## 2023-08-31 ENCOUNTER — Ambulatory Visit: Payer: Medicaid Other | Admitting: Emergency Medicine

## 2023-08-31 VITALS — BP 118/78 | HR 76 | Temp 98.3°F | Ht 68.0 in | Wt 160.0 lb

## 2023-08-31 DIAGNOSIS — E785 Hyperlipidemia, unspecified: Secondary | ICD-10-CM

## 2023-08-31 DIAGNOSIS — J984 Other disorders of lung: Secondary | ICD-10-CM

## 2023-08-31 DIAGNOSIS — Z23 Encounter for immunization: Secondary | ICD-10-CM

## 2023-08-31 DIAGNOSIS — E1159 Type 2 diabetes mellitus with other circulatory complications: Secondary | ICD-10-CM | POA: Diagnosis not present

## 2023-08-31 DIAGNOSIS — J479 Bronchiectasis, uncomplicated: Secondary | ICD-10-CM | POA: Diagnosis not present

## 2023-08-31 DIAGNOSIS — Z7984 Long term (current) use of oral hypoglycemic drugs: Secondary | ICD-10-CM

## 2023-08-31 DIAGNOSIS — E1169 Type 2 diabetes mellitus with other specified complication: Secondary | ICD-10-CM | POA: Diagnosis not present

## 2023-08-31 DIAGNOSIS — Z1231 Encounter for screening mammogram for malignant neoplasm of breast: Secondary | ICD-10-CM

## 2023-08-31 DIAGNOSIS — I152 Hypertension secondary to endocrine disorders: Secondary | ICD-10-CM | POA: Diagnosis not present

## 2023-08-31 DIAGNOSIS — M2550 Pain in unspecified joint: Secondary | ICD-10-CM

## 2023-08-31 LAB — COMPREHENSIVE METABOLIC PANEL
ALT: 10 U/L (ref 0–35)
AST: 15 U/L (ref 0–37)
Albumin: 4.4 g/dL (ref 3.5–5.2)
Alkaline Phosphatase: 85 U/L (ref 39–117)
BUN: 9 mg/dL (ref 6–23)
CO2: 30 meq/L (ref 19–32)
Calcium: 9.6 mg/dL (ref 8.4–10.5)
Chloride: 100 meq/L (ref 96–112)
Creatinine, Ser: 0.63 mg/dL (ref 0.40–1.20)
GFR: 89.64 mL/min (ref >60.00)
Glucose, Bld: 147 mg/dL — ABNORMAL HIGH (ref 70–99)
Potassium: 3.5 meq/L (ref 3.5–5.1)
Sodium: 140 meq/L (ref 135–145)
Total Bilirubin: 0.4 mg/dL (ref 0.2–1.2)
Total Protein: 8.2 g/dL (ref 6.0–8.3)

## 2023-08-31 LAB — POCT GLYCOSYLATED HEMOGLOBIN (HGB A1C): Hemoglobin A1C: 7.5 % — AB (ref 4.0–5.6)

## 2023-08-31 LAB — LIPID PANEL
Cholesterol: 173 mg/dL (ref 0–200)
HDL: 53.6 mg/dL (ref >39.00)
LDL Cholesterol: 103 mg/dL — ABNORMAL HIGH (ref 0–99)
NonHDL: 119.67
Total CHOL/HDL Ratio: 3
Triglycerides: 85 mg/dL (ref 0.0–149.0)
VLDL: 17 mg/dL (ref 0.0–40.0)

## 2023-08-31 LAB — MICROALBUMIN / CREATININE URINE RATIO
Creatinine,U: 116.6 mg/dL
Microalb Creat Ratio: 1.1 mg/g (ref 0.0–30.0)
Microalb, Ur: 1.3 mg/dL (ref 0.0–1.9)

## 2023-08-31 MED ORDER — MELOXICAM 15 MG PO TABS: 15.0000 mg | ORAL_TABLET | Freq: Every day | ORAL | 1 refills | Status: DC

## 2023-08-31 MED ORDER — AMLODIPINE BESYLATE 10 MG PO TABS: 10.0000 mg | ORAL_TABLET | Freq: Every day | ORAL | 1 refills | Status: DC

## 2023-08-31 MED ORDER — METFORMIN HCL ER 500 MG PO TB24: 500.0000 mg | ORAL_TABLET | Freq: Two times a day (BID) | ORAL | 1 refills | Status: DC

## 2023-08-31 MED ORDER — ROSUVASTATIN CALCIUM 20 MG PO TABS: 20.0000 mg | ORAL_TABLET | Freq: Every day | ORAL | 1 refills | Status: DC

## 2023-08-31 MED ORDER — SITAGLIPTIN PHOSPHATE 50 MG PO TABS: 50.0000 mg | ORAL_TABLET | Freq: Every day | ORAL | 1 refills | Status: DC

## 2023-08-31 MED ORDER — GLIMEPIRIDE 2 MG PO TABS: 2.0000 mg | ORAL_TABLET | Freq: Every day | ORAL | 1 refills | Status: DC

## 2023-08-31 NOTE — Assessment & Plan Note (Signed)
Chronic stable condition.  Asymptomatic. No recent infections.  No hemoptysis.

## 2023-08-31 NOTE — Assessment & Plan Note (Signed)
Chronic stable condition Meloxicam helps Recommend meloxicam 15 mg as needed

## 2023-08-31 NOTE — Assessment & Plan Note (Addendum)
Chronic stable conditions Continue Januvia, metformin, and glimepiride Continue rosuvastatin 20 mg daily Lipid profile done today Diet and nutrition discussed

## 2023-08-31 NOTE — Patient Instructions (Signed)

## 2023-08-31 NOTE — Assessment & Plan Note (Signed)
BP Readings from Last 3 Encounters:  08/31/23 118/78  02/17/23 122/82  10/06/22 110/80   Lab Results  Component Value Date   HGBA1C 6.6 (A) 02/17/2023  Well-controlled hypertension Continue amlodipine 10 mg Hemoglobin A1c at 7.5, higher than before Diet and nutrition discussed Continue Januvia 50 mg daily, metformin 500 mg twice a day, glimepiride 2 mg daily. Cardiovascular risks associated with diabetes and hypertension discussed

## 2023-08-31 NOTE — Progress Notes (Signed)
Cheryl Hopkins 72 y.o.   Chief Complaint  Patient presents with   Medical Management of Chronic Issues    f/u appt, no concerns     HISTORY OF PRESENT ILLNESS: This is a 72 y.o. female A1A here for 32-month follow-up of chronic medical conditions including diabetes and hypertension Here with interpreter.  Originally from [place], [place] Overall doing well.  Has no complaints or medical concerns today. Lab Results  Component Value Date   HGBA1C 6.6 (A) 02/17/2023   Wt Readings from Last 3 Encounters:  08/31/23 160 lb (72.6 kg)  02/17/23 156 lb 6 oz (70.9 kg)  01/07/23 158 lb (71.7 kg)   BP Readings from Last 3 Encounters:  08/31/23 118/78  02/17/23 122/82  10/06/22 110/80     HPI   Prior to Admission medications   Medication Sig Start Date End Date Taking? Authorizing Provider  ACCU-CHEK GUIDE test strip USE 1 STRIP ONCE DAILY 02/17/23  Yes Georgina Quint, MD  Accu-Chek Softclix Lancets lancets SMARTSIG:1 Topical Daily 02/17/23  Yes Teighlor Korson, Eilleen Kempf, MD  amLODipine (NORVASC) 10 MG tablet Take 1 tablet (10 mg total) by mouth daily. 02/17/23 02/12/24 Yes Niasha Devins, Eilleen Kempf, MD  Blood Glucose Monitoring Suppl w/Device KIT 1 Device by Does not apply route daily. 02/17/23  Yes Daphnie Venturini, Eilleen Kempf, MD  budesonide-formoterol [facility]) 80-4.5 MCG/ACT inhaler Inhale 2 puffs into the lungs 2 (two) times daily. 06/25/22  Yes Keelon Zurn, Eilleen Kempf, MD  gabapentin (NEURONTIN) 300 MG capsule Take 1 capsule by mouth at bedtime 05/29/23  Yes Johnye Kist, Eilleen Kempf, MD  glimepiride (AMARYL) 2 MG tablet Take 1 tablet (2 mg total) by mouth daily with breakfast. TAKE ONE TABLET BY MOUTH WITH BREAKFAST 02/17/23 02/12/24 Yes Tiarna Koppen, Eilleen Kempf, MD  metFORMIN (GLUCOPHAGE-XR) 500 MG 24 hr tablet Take 1 tablet (500 mg total) by mouth 2 (two) times daily with a meal. 02/17/23 02/12/24 Yes Zaiyden Strozier, Eilleen Kempf, MD  Multiple Vitamin (MULTIVITAMIN) tablet Take 1 tablet by mouth daily.   Yes  [provider]  rosuvastatin (CRESTOR) 20 MG tablet Take 1 tablet (20 mg total) by mouth daily. 02/17/23 02/12/24 Yes Luverne Farone, Eilleen Kempf, MD  sitaGLIPtin (JANUVIA) 50 MG tablet Take 1 tablet (50 mg total) by mouth daily. 02/17/23 02/12/24 Yes Calla Wedekind, Eilleen Kempf, MD  Spacer/Aero-Holding Chambers (AEROCHAMBER PLUS) inhaler Use as instructed 04/23/21  Yes Georgina Quint, MD    No Known Allergies  Patient Active Problem List   Diagnosis Date Noted   Paresthesia of both lower extremities 05/20/2021   History of simple renal cyst 04/23/2021   Bronchiectasis without complication (HCC) 08/14/2020   Language barrier 08/14/2020   Mixed conductive and sensorineural hearing loss of left ear with restricted hearing of right ear 06/19/2020   Solitary pulmonary nodule 03/05/2020   Chronic lung disease 10/31/2015   Hypertension associated with diabetes (HCC) 10/31/2015   Dyslipidemia associated with type 2 diabetes mellitus (HCC) 09/30/2015   Hyperlipidemia 09/30/2015    Past Medical History:  Diagnosis Date   Arthritis    knee   Bronchitis    Diabetes mellitus without complication (HCC)    Several years.   Hyperlipidemia 09/30/2015   Hypertension    Stomach pain    with diarrhea    Past Surgical History:  Procedure Laterality Date   COLONOSCOPY      Social History   Socioeconomic History   Marital status: Widowed    Spouse name: Widowed since 2010   Number of children: 9  Years of education: <8   Highest education level: Not on file  Occupational History   Occupation: Housewife  Tobacco Use   Smoking status: Never   Smokeless tobacco: Never   Tobacco comments:    Chews cola nut  Vaping Use   Vaping status: Never Used  Substance and Sexual Activity   Alcohol use: No    Alcohol/week: 0.0 standard drinks of alcohol   Drug use: No   Sexual activity: Not Currently  Other Topics Concern   Not on file  Social History Narrative      Widow   Originally  from [place] - Came to [employer]. In 2016   Lives with son and his wife and their 3 children.        Exercise - no exercise      Seatbelt - 100%   Gun in home - no   Social Determinants of Health   Financial Resource Strain: Not on file  Food Insecurity: Not on file  Transportation Needs: Not on file  Physical Activity: Not on file  Stress: Not on file  Social Connections: Not on file  Intimate Partner Violence: Not on file    Family History  Problem Relation Age of Onset   Bronchitis Sister    Hypertension Sister    Stroke Sister    Hepatitis Daughter    Colon cancer Neg Hx    Esophageal cancer Neg Hx    Rectal cancer Neg Hx    Stomach cancer Neg Hx      Review of Systems  Constitutional: Negative.  Negative for chills and fever.  HENT: Negative.  Negative for congestion and sore throat.   Respiratory: Negative.  Negative for cough and shortness of breath.   Cardiovascular: Negative.  Negative for chest pain and palpitations.  Gastrointestinal:  Negative for abdominal pain, diarrhea, nausea and vomiting.  Genitourinary: Negative.  Negative for dysuria and hematuria.  Skin: Negative.  Negative for rash.  Neurological: Negative.  Negative for dizziness and headaches.  All other systems reviewed and are negative.   Vitals:   08/31/23 0839  BP: 118/78  Pulse: 76  Temp: 98.3 F (36.8 C)  SpO2: 90%    Physical Exam Vitals reviewed.  Constitutional:      Appearance: Normal appearance.  HENT:     Head: Normocephalic.     Mouth/Throat:     Mouth: Mucous membranes are moist.     Pharynx: Oropharynx is clear.  Eyes:     Extraocular Movements: Extraocular movements intact.     Conjunctiva/sclera: Conjunctivae normal.     Pupils: Pupils are equal, round, and reactive to light.  Cardiovascular:     Rate and Rhythm: Normal rate and regular rhythm.     Pulses: Normal pulses.     Heart sounds: Normal heart sounds.  Pulmonary:     Effort: Pulmonary effort is normal.      Breath sounds: Normal breath sounds.  Musculoskeletal:     Cervical back: No tenderness.  Lymphadenopathy:     Cervical: No cervical adenopathy.  Skin:    General: Skin is warm and dry.     Capillary Refill: Capillary refill takes less than 2 seconds.  Neurological:     General: No focal deficit present.     Mental Status: She is alert and oriented to person, place, and time.  Psychiatric:        Mood and Affect: Mood normal.        Behavior: Behavior normal.  Results for orders placed or performed in visit on 08/31/23 (from the past 24 hour(s))  POCT HgB A1C     Status: Abnormal   Collection Time: 08/31/23  9:27 AM  Result Value Ref Range   Hemoglobin A1C 7.5 (A) 4.0 - 5.6 %   HbA1c POC (<> result, manual entry)     HbA1c, POC (prediabetic range)     HbA1c, POC (controlled diabetic range)    Comprehensive metabolic panel     Status: Abnormal   Collection Time: 08/31/23  9:29 AM  Result Value Ref Range   Sodium 140 135 - 145 mEq/L   Potassium 3.5 3.5 - 5.1 mEq/L   Chloride 100 96 - 112 mEq/L   CO2 30 19 - 32 mEq/L   Glucose, Bld 147 (H) 70 - 99 mg/dL   BUN 9 6 - 23 mg/dL   Creatinine, Ser 2.13 0.40 - 1.20 mg/dL   Total Bilirubin 0.4 0.2 - 1.2 mg/dL   Alkaline Phosphatase 85 39 - 117 U/L   AST 15 0 - 37 U/L   ALT 10 0 - 35 U/L   Total Protein 8.2 6.0 - 8.3 g/dL   Albumin 4.4 3.5 - 5.2 g/dL   GFR 08.65 >78.46 mL/min   Calcium 9.6 8.4 - 10.5 mg/dL  Lipid panel     Status: Abnormal   Collection Time: 08/31/23  9:29 AM  Result Value Ref Range   Cholesterol 173 0 - 200 mg/dL   Triglycerides 96.2 0.0 - 149.0 mg/dL   HDL 95.28 >41.32 mg/dL   VLDL 44.0 0.0 - 10.2 mg/dL   LDL Cholesterol 725 (H) 0 - 99 mg/dL   Total CHOL/HDL Ratio 3    NonHDL 119.67      ASSESSMENT & PLAN: A total of 44 minutes was spent with the patient and counseling/coordination of care regarding preparing for this visit, review of most recent office visit notes, review of multiple chronic medical  conditions under management, review of most recent blood work results including interpretation of today's hemoglobin A1c, review of all medications, cardiovascular risks associated with hypertension and diabetes, education on nutrition, prognosis, documentation and need for follow-up  Problem List Items Addressed This Visit       Cardiovascular and Mediastinum   Hypertension associated with diabetes (HCC) - Primary    BP Readings from Last 3 Encounters:  08/31/23 118/78  02/17/23 122/82  10/06/22 110/80   Lab Results  Component Value Date   HGBA1C 6.6 (A) 02/17/2023  Well-controlled hypertension Continue amlodipine 10 mg Hemoglobin A1c at 7.5, higher than before Diet and nutrition discussed Continue Januvia 50 mg daily, metformin 500 mg twice a day, glimepiride 2 mg daily. Cardiovascular risks associated with diabetes and hypertension discussed       Relevant Medications   amLODipine (NORVASC) 10 MG tablet   glimepiride (AMARYL) 2 MG tablet   metFORMIN (GLUCOPHAGE-XR) 500 MG 24 hr tablet   rosuvastatin (CRESTOR) 20 MG tablet   sitaGLIPtin (JANUVIA) 50 MG tablet   Other Relevant Orders   POCT HgB A1C (Completed)   Urine Microalbumin w/creat. ratio   Comprehensive metabolic panel (Completed)   Lipid panel (Completed)     Respiratory   Chronic lung disease   Bronchiectasis without complication (HCC)    Chronic stable condition.  Asymptomatic. No recent infections.  No hemoptysis.        Endocrine   Dyslipidemia associated with type 2 diabetes mellitus (HCC)    Chronic stable conditions Continue Januvia, metformin, and  glimepiride Continue rosuvastatin 20 mg daily Lipid profile done today Diet and nutrition discussed      Relevant Medications   glimepiride (AMARYL) 2 MG tablet   metFORMIN (GLUCOPHAGE-XR) 500 MG 24 hr tablet   rosuvastatin (CRESTOR) 20 MG tablet   sitaGLIPtin (JANUVIA) 50 MG tablet   Other Relevant Orders   POCT HgB A1C (Completed)    Comprehensive metabolic panel (Completed)   Lipid panel (Completed)     Other   Arthralgia of multiple joints    Chronic stable condition Meloxicam helps Recommend meloxicam 15 mg as needed      Relevant Medications   meloxicam (MOBIC) 15 MG tablet   Other Visit Diagnoses     Need for vaccination       Relevant Orders   Flu Vaccine Trivalent High Dose (Fluad) (Completed)   Screening mammogram for breast cancer       Relevant Orders   MM 3D SCREENING MAMMOGRAM BILATERAL BREAST      Patient Instructions  Diabetes Mellitus and Nutrition, Adult When you have diabetes, or diabetes mellitus, it is very important to have healthy eating habits because your blood sugar (glucose) levels are greatly affected by what you eat and drink. Eating healthy foods in the right amounts, at about the same times every day, can help you: Manage your blood glucose. Lower your risk of heart disease. Improve your blood pressure. Reach or maintain a healthy weight. What can affect my meal plan? Every person with diabetes is different, and each person has different needs for a meal plan. Your health care provider may recommend that you work with a dietitian to make a meal plan that is best for you. Your meal plan may vary depending on factors such as: The calories you need. The medicines you take. Your weight. Your blood glucose, blood pressure, and cholesterol levels. Your activity level. Other health conditions you have, such as heart or kidney disease. How do carbohydrates affect me? Carbohydrates, also called carbs, affect your blood glucose level more than any other type of food. Eating carbs raises the amount of glucose in your blood. It is important to know how many carbs you can safely have in each meal. This is different for every person. Your dietitian can help you calculate how many carbs you should have at each meal and for each snack. How does alcohol affect me? Alcohol can cause a  decrease in blood glucose (hypoglycemia), especially if you use insulin or take certain diabetes medicines by mouth. Hypoglycemia can be a life-threatening condition. Symptoms of hypoglycemia, such as sleepiness, dizziness, and confusion, are similar to symptoms of having too much alcohol. Do not drink alcohol if: Your health care provider tells you not to drink. You are pregnant, may be pregnant, or are planning to become pregnant. If you drink alcohol: Limit how much you have to: 0-1 drink a day for women. 0-2 drinks a day for men. Know how much alcohol is in your drink. In the [place] one drink equals one 12 oz bottle of beer (355 mL), one 5 oz glass of wine (148 mL), or one 1 oz glass of hard liquor (44 mL). Keep yourself hydrated with water, diet soda, or unsweetened iced tea. Keep in mind that regular soda, juice, and other mixers may contain a lot of sugar and must be counted as carbs. What are tips for following this plan?  Reading food labels Start by checking the serving size on the Nutrition Facts label of packaged foods  and drinks. The number of calories and the amount of carbs, fats, and other nutrients listed on the label are based on one serving of the item. Many items contain more than one serving per package. Check the total grams (g) of carbs in one serving. Check the number of grams of saturated fats and trans fats in one serving. Choose foods that have a low amount or none of these fats. Check the number of milligrams (mg) of salt (sodium) in one serving. Most people should limit total sodium intake to less than 2,300 mg per day. Always check the nutrition information of foods labeled as "low-fat" or "nonfat." These foods may be higher in added sugar or refined carbs and should be avoided. Talk to your dietitian to identify your daily goals for nutrients listed on the label. Shopping Avoid buying canned, pre-made, or processed foods. These foods tend to be high in fat, sodium,  and added sugar. Shop around the outside edge of the grocery store. This is where you will most often find fresh fruits and vegetables, bulk grains, fresh meats, and fresh dairy products. Cooking Use low-heat cooking methods, such as baking, instead of high-heat cooking methods, such as deep frying. Cook using healthy oils, such as olive, canola, or sunflower oil. Avoid cooking with butter, cream, or high-fat meats. Meal planning Eat meals and snacks regularly, preferably at the same times every day. Avoid going long periods of time without eating. Eat foods that are high in fiber, such as fresh fruits, vegetables, beans, and whole grains. Eat 4-6 oz (112-168 g) of lean protein each day, such as lean meat, chicken, fish, eggs, or tofu. One ounce (oz) (28 g) of lean protein is equal to: 1 oz (28 g) of meat, chicken, or fish. 1 egg.  cup (62 g) of tofu. Eat some foods each day that contain healthy fats, such as avocado, nuts, seeds, and fish. What foods should I eat? Fruits Berries. Apples. Oranges. Peaches. Apricots. Plums. Grapes. Mangoes. Papayas. Pomegranates. Kiwi. Cherries. Vegetables Leafy greens, including lettuce, spinach, kale, chard, collard greens, mustard greens, and cabbage. Beets. Cauliflower. Broccoli. Carrots. Green beans. Tomatoes. Peppers. Onions. Cucumbers. Brussels sprouts. Grains Whole grains, such as whole-wheat or whole-grain bread, crackers, tortillas, cereal, and pasta. Unsweetened oatmeal. Quinoa. Brown or wild rice. Meats and other proteins Seafood. Poultry without skin. Lean cuts of poultry and beef. Tofu. Nuts. Seeds. Dairy Low-fat or fat-free dairy products such as milk, yogurt, and cheese. The items listed above may not be a complete list of foods and beverages you can eat and drink. Contact a dietitian for more information. What foods should I avoid? Fruits Fruits canned with syrup. Vegetables Canned vegetables. Frozen vegetables with butter or cream  sauce. Grains Refined white flour and flour products such as bread, pasta, snack foods, and cereals. Avoid all processed foods. Meats and other proteins Fatty cuts of meat. Poultry with skin. Breaded or fried meats. Processed meat. Avoid saturated fats. Dairy Full-fat yogurt, cheese, or milk. Beverages Sweetened drinks, such as soda or iced tea. The items listed above may not be a complete list of foods and beverages you should avoid. Contact a dietitian for more information. Questions to ask a health care provider Do I need to meet with a certified diabetes care and education specialist? Do I need to meet with a dietitian? What number can I call if I have questions? When are the best times to check my blood glucose? Where to find more information: American Diabetes Association: diabetes.org  Academy of Nutrition and Dietetics: eatright.[employer] of Diabetes and Digestive and Kidney Diseases: StageSync.si Association of Diabetes Care & Education Specialists: diabeteseducator.org Summary It is important to have healthy eating habits because your blood sugar (glucose) levels are greatly affected by what you eat and drink. It is important to use alcohol carefully. A healthy meal plan will help you manage your blood glucose and lower your risk of heart disease. Your health care provider may recommend that you work with a dietitian to make a meal plan that is best for you. This information is not intended to replace advice given to you by your health care provider. Make sure you discuss any questions you have with your health care provider. Document Revised: 05/22/2020 Document Reviewed: 05/22/2020 Elsevier Patient Education  2024 Elsevier Inc.      Edwina Barth, MD [facility]

## 2023-10-08 ENCOUNTER — Ambulatory Visit
Admission: RE | Admit: 2023-10-08 | Discharge: 2023-10-08 | Disposition: A | Discharge status: Home / Self Care | Payer: Medicaid Other | Source: Ambulatory Visit | Attending: Emergency Medicine | Admitting: Emergency Medicine

## 2023-10-08 DIAGNOSIS — Z1231 Encounter for screening mammogram for malignant neoplasm of breast: Secondary | ICD-10-CM

## 2023-11-11 ENCOUNTER — Other Ambulatory Visit: Payer: Self-pay

## 2023-11-11 ENCOUNTER — Encounter (HOSPITAL_COMMUNITY): Payer: Self-pay | Admitting: Emergency Medicine

## 2023-11-11 ENCOUNTER — Emergency Department (HOSPITAL_COMMUNITY): Payer: Medicaid Other

## 2023-11-11 ENCOUNTER — Ambulatory Visit: Payer: Self-pay | Admitting: Emergency Medicine

## 2023-11-11 ENCOUNTER — Emergency Department (HOSPITAL_COMMUNITY)
Admission: EM | Admit: 2023-11-11 | Discharge: 2023-11-11 | Disposition: A | Discharge status: Home / Self Care | Payer: Medicaid Other | Attending: Emergency Medicine | Admitting: Emergency Medicine

## 2023-11-11 DIAGNOSIS — R42 Dizziness and giddiness: Secondary | ICD-10-CM | POA: Diagnosis not present

## 2023-11-11 DIAGNOSIS — R109 Unspecified abdominal pain: Secondary | ICD-10-CM | POA: Diagnosis not present

## 2023-11-11 DIAGNOSIS — I10 Essential (primary) hypertension: Secondary | ICD-10-CM | POA: Diagnosis not present

## 2023-11-11 DIAGNOSIS — J181 Lobar pneumonia, unspecified organism: Secondary | ICD-10-CM | POA: Diagnosis not present

## 2023-11-11 DIAGNOSIS — E119 Type 2 diabetes mellitus without complications: Secondary | ICD-10-CM | POA: Insufficient documentation

## 2023-11-11 DIAGNOSIS — Z79899 Other long term (current) drug therapy: Secondary | ICD-10-CM | POA: Diagnosis not present

## 2023-11-11 DIAGNOSIS — R059 Cough, unspecified: Secondary | ICD-10-CM | POA: Diagnosis present

## 2023-11-11 DIAGNOSIS — J189 Pneumonia, unspecified organism: Secondary | ICD-10-CM

## 2023-11-11 DIAGNOSIS — Z7984 Long term (current) use of oral hypoglycemic drugs: Secondary | ICD-10-CM | POA: Diagnosis not present

## 2023-11-11 LAB — BASIC METABOLIC PANEL
Anion gap: 10 (ref 5–15)
BUN: 10 mg/dL (ref 8–23)
CO2: 29 mmol/L (ref 22–32)
Calcium: 9.6 mg/dL (ref 8.9–10.3)
Chloride: 99 mmol/L (ref 98–111)
Creatinine, Ser: 0.6 mg/dL (ref 0.44–1.00)
GFR, Estimated: >60 mL/min (ref >60)
Glucose, Bld: 150 mg/dL — ABNORMAL HIGH (ref 70–99)
Potassium: 3.8 mmol/L (ref 3.5–5.1)
Sodium: 138 mmol/L (ref 135–145)

## 2023-11-11 LAB — CBC
HCT: 42.5 % (ref 36.0–46.0)
Hemoglobin: 14 g/dL (ref 12.0–15.0)
MCH: 27.6 pg (ref 26.0–34.0)
MCHC: 32.9 g/dL (ref 30.0–36.0)
MCV: 83.7 fL (ref 80.0–100.0)
Platelets: 341 10*3/uL (ref 150–400)
RBC: 5.08 MIL/uL (ref 3.87–5.11)
RDW: 13.5 % (ref 11.5–15.5)
WBC: 7.3 10*3/uL (ref 4.0–10.5)
nRBC: 0 % (ref 0.0–0.2)

## 2023-11-11 LAB — URINALYSIS, ROUTINE W REFLEX MICROSCOPIC
Bilirubin Urine: NEGATIVE
Glucose, UA: NEGATIVE mg/dL
Hgb urine dipstick: NEGATIVE
Ketones, ur: NEGATIVE mg/dL
Leukocytes,Ua: NEGATIVE
Nitrite: NEGATIVE
Protein, ur: NEGATIVE mg/dL
Specific Gravity, Urine: 1.006 (ref 1.005–1.030)
pH: 6 (ref 5.0–8.0)

## 2023-11-11 LAB — TROPONIN I (HIGH SENSITIVITY): Troponin I (High Sensitivity): 9 ng/L (ref <18)

## 2023-11-11 LAB — CBG MONITORING, ED: Glucose-Capillary: 154 mg/dL — ABNORMAL HIGH (ref 70–99)

## 2023-11-11 MED ORDER — AMOXICILLIN-POT CLAVULANATE 875-125 MG PO TABS: 1.0000 | ORAL_TABLET | Freq: Once | ORAL | Status: DC

## 2023-11-11 MED ORDER — AMOXICILLIN-POT CLAVULANATE 875-125 MG PO TABS: 1.0000 | ORAL_TABLET | Freq: Two times a day (BID) | ORAL | 0 refills | Status: DC

## 2023-11-11 NOTE — ED Triage Notes (Signed)
 Pt reports left flank pain with headache and dizziness x 1 week.  Feels like she may fall. Has not fallen.  No n/v/d or shob.

## 2023-11-11 NOTE — ED Notes (Signed)
 Patient states they will go pick up prescription and take dose at home

## 2023-11-11 NOTE — ED Provider Notes (Signed)
 [facility] Provider Note   CSN: [id number] Arrival date & time: 11/11/23  1800     History  Chief Complaint  Patient presents with   Dizziness   Flank Pain    Cheryl Hopkins is a 73 y.o. female.   Dizziness Flank Pain     Patient has a history of hyperlipidemia diabetes bronchitis hypertension.  She presents to the ED with complaints of left-sided chest abdomen pain ongoing for the last week or so.  Patient states she has had a mild cough but nothing too severe.  She does not feel short of breath.  She has not had any vomiting or diarrhea.  Patient however also has felt somewhat lightheaded and dizzy.  She feels like the hearing is off a little bit.  She has not had any trouble with her balance or coordination.  No trouble with her speech.  Home Medications Prior to Admission medications   Medication Sig Start Date End Date Taking? Authorizing Provider  amoxicillin -clavulanate (AUGMENTIN ) 875-125 MG tablet Take 1 tablet by mouth every 12 (twelve) hours. 11/11/23  Yes Randol Simmonds, MD  ACCU-CHEK GUIDE test strip USE 1 STRIP ONCE DAILY 02/17/23   Purcell Emil Schanz, MD  Accu-Chek Softclix Lancets lancets SMARTSIG:1 Topical Daily 02/17/23   Sagardia, Miguel Jose, MD  amLODipine  (NORVASC ) 10 MG tablet Take 1 tablet (10 mg total) by mouth daily. 08/31/23 08/25/24  Purcell Emil Schanz, MD  Blood Glucose Monitoring Suppl w/Device KIT 1 Device by Does not apply route daily. 02/17/23   Sagardia, Miguel Jose, MD  budesonide -formoterol  (SYMBICORT ) 80-4.5 MCG/ACT inhaler Inhale 2 puffs into the lungs 2 (two) times daily. 06/25/22   Purcell Emil Schanz, MD  gabapentin  (NEURONTIN ) 300 MG capsule Take 1 capsule by mouth at bedtime 05/29/23   Sagardia, Miguel Jose, MD  glimepiride  (AMARYL ) 2 MG tablet Take 1 tablet (2 mg total) by mouth daily with breakfast. TAKE ONE TABLET BY MOUTH WITH BREAKFAST 08/31/23 08/25/24  Purcell Emil Schanz, MD  meloxicam   (MOBIC ) 15 MG tablet Take 1 tablet (15 mg total) by mouth daily. 08/31/23   Sagardia, Miguel Jose, MD  metFORMIN  (GLUCOPHAGE -XR) 500 MG 24 hr tablet Take 1 tablet (500 mg total) by mouth 2 (two) times daily with a meal. 08/31/23 08/25/24  Purcell Emil Schanz, MD  Multiple Vitamin (MULTIVITAMIN) tablet Take 1 tablet by mouth daily.    [provider]  rosuvastatin  (CRESTOR ) 20 MG tablet Take 1 tablet (20 mg total) by mouth daily. 08/31/23 08/25/24  Purcell Emil Schanz, MD  sitaGLIPtin  (JANUVIA ) 50 MG tablet Take 1 tablet (50 mg total) by mouth daily. 08/31/23 08/25/24  Purcell Emil Schanz, MD  Spacer/Aero-Holding Chambers (AEROCHAMBER PLUS) inhaler Use as instructed 04/23/21   Purcell Emil Schanz, MD      Allergies    Patient has no known allergies.    Review of Systems   Review of Systems  Genitourinary:  Positive for flank pain.  Neurological:  Positive for dizziness.    Physical Exam Updated Vital Signs BP (!) 140/61 (BP Location: Right Arm)   Pulse 83   Temp 98.2 F (36.8 C) (Oral)   Resp 18   LMP 11/02/2010 (Approximate) Comment: postmenopausal  SpO2 91%  Physical Exam Vitals and nursing note reviewed.  Constitutional:      General: She is not in acute distress.    Appearance: She is well-developed.  HENT:     Head: Normocephalic and atraumatic.     Right Ear:  Tympanic membrane, ear canal and external ear normal.     Left Ear: Tympanic membrane, ear canal and external ear normal.  Eyes:     General: No visual field deficit or scleral icterus.       Right eye: No discharge.        Left eye: No discharge.     Conjunctiva/sclera: Conjunctivae normal.  Neck:     Trachea: No tracheal deviation.  Cardiovascular:     Rate and Rhythm: Normal rate and regular rhythm.  Pulmonary:     Effort: Pulmonary effort is normal. No respiratory distress.     Breath sounds: Normal breath sounds. No stridor. No wheezing or rales.  Abdominal:     General: Bowel sounds are  normal. There is no distension.     Palpations: Abdomen is soft.     Tenderness: There is no abdominal tenderness. There is no guarding or rebound.  Musculoskeletal:        General: No tenderness.     Cervical back: Neck supple.  Skin:    General: Skin is warm and dry.     Findings: No rash.  Neurological:     Mental Status: She is alert and oriented to person, place, and time.     Cranial Nerves: No cranial nerve deficit, dysarthria or facial asymmetry.     Sensory: No sensory deficit.     Motor: No abnormal muscle tone, seizure activity or pronator drift.     Coordination: Coordination normal.     Comments:  able to hold both legs off bed for 5 seconds, sensation intact in all extremities,  no left or right sided neglect, normal finger-nose exam bilaterally, no nystagmus noted   Psychiatric:        Mood and Affect: Mood normal.     ED Results / Procedures / Treatments   Labs (all labs ordered are listed, but only abnormal results are displayed) Labs Reviewed  BASIC METABOLIC PANEL - Abnormal; Notable for the following components:      Result Value   Glucose, Bld 150 (*)    All other components within normal limits  URINALYSIS, ROUTINE W REFLEX MICROSCOPIC - Abnormal; Notable for the following components:   Color, Urine STRAW (*)    All other components within normal limits  CBG MONITORING, ED - Abnormal; Notable for the following components:   Glucose-Capillary 154 (*)    All other components within normal limits  CBC  TROPONIN I (HIGH SENSITIVITY)    EKG EKG Interpretation Date/Time:  Thursday November 11 2023 18:58:01 EST Ventricular Rate:  83 PR Interval:  176 QRS Duration:  74 QT Interval:  378 QTC Calculation: 444 R Axis:   37  Text Interpretation: Normal sinus rhythm Normal ECG When compared with ECG of 25-Oct-2021 19:59, No significant change since last tracing Confirmed by Randol Simmonds [phone]) on 11/11/2023 8:21:35 PM  Radiology DG Chest 2 View Result Date:  11/11/2023 CLINICAL DATA:  Chest pain EXAM: CHEST - 2 VIEW COMPARISON:  Chest radiograph dated 10/25/2021 FINDINGS: Normal lung volumes. Diffuse bilateral interstitial and nodular opacities, most pronounced in the left lower lobe. No pleural effusion or pneumothorax. The heart size and mediastinal contours are within normal limits. No acute osseous abnormality. IMPRESSION: Diffuse bilateral interstitial and nodular opacities, most pronounced in the left lower lobe, suspicious for multifocal infection/inflammation. Electronically Signed   By: Limin  Xu M.D.   On: 11/11/2023 21:21    Procedures Procedures    Medications Ordered in ED Medications  amoxicillin -clavulanate (AUGMENTIN ) 875-125 MG per tablet 1 tablet (has no administration in time range)    ED Course/ Medical Decision Making/ A&P Clinical Course as of 11/11/23 2159  Thu Nov 11, 2023  2025 Discussed with pt [JK]  2144 Chest x-ray concerning for possible pneumonia [JK]  2145 Troponin normal [JK]    Clinical Course User Index [JK] Randol Simmonds, MD                                 Medical Decision Making Amount and/or Complexity of Data Reviewed Labs: ordered. Radiology: ordered.  Risk Prescription drug management.   Patient's neurologic exam is reassuring.  No findings to suggest stroke or TIA as a cause of her dizziness.  Patient also reported some left-sided chest flank pain.  Her abdominal exam is benign.  Low suspicion for pancreatitis or hepatitis based on her exam.  Patient does not have any evidence of urinary tract infection or hematuria to suggest ureterolithiasis.  Her chest x-ray however does suggest the possibility of a lower left pneumonia.  This certainly could be the source of her symptoms as her symptoms are on the left side of her chest.  Patient is nontoxic and well-appearing oxygen saturation low 90s but patient does not require supplemental oxygen will start on a course of antibiotics.  Recommend outpatient  follow-up with a PCP to be rechecked Evaluation and diagnostic testing in the emergency department does not suggest an emergent condition requiring admission or immediate intervention beyond what has been performed at this time.  The patient is safe for discharge and has been instructed to return immediately for worsening symptoms, change in symptoms or any other concerns.       Final Clinical Impression(s) / ED Diagnoses Final diagnoses:  Community acquired pneumonia of left lower lobe of lung    Rx / DC Orders ED Discharge Orders          Ordered    amoxicillin -clavulanate (AUGMENTIN ) 875-125 MG tablet  Every 12 hours        11/11/23 2156              Randol Simmonds, MD 11/11/23 2159

## 2023-11-11 NOTE — Discharge Instructions (Addendum)
 Take the antibiotics as prescribed for pneumonia.  Follow-up with your doctor to be rechecked.  Return to the ED for fevers chills worsening symptoms

## 2023-11-11 NOTE — Telephone Encounter (Signed)
  Chief Complaint: Headache, weakness and dizziness Symptoms: weakness, dizziness Frequency: Since 11/09/2023 Pertinent Negatives: Patient denies fever Disposition: [x] ED /[] Urgent Care (no appt availability in office) / [] Appointment(In office/virtual)/ []  [facility]/ [] Home Care/ [] Refused Recommended Disposition /[] [place] [facility]/ []  Follow-up with PCP Additional Notes: patient's son called with patient having complaints of headache, dizziness and weakness. Son states symptoms started on Monday and have gotten worse. Per protocol, the recommendation is to get the patient to the ED. Son is out of town so he is going to get someone in town to get patient to the ED. Patient's son verbalized understanding of plan and all questions answered.    Copied from CRM [id number]. Topic: Clinical - Pink Word Triage >> Nov 11, 2023 12:42 PM Renea ORN wrote: Reason for Triage:Dizziness and Weakness Reason for Disposition  Headache  (and neurologic deficit)  Answer Assessment - Initial Assessment Questions 1. SYMPTOM: What is the main symptom you are concerned about? (e.g., weakness, numbness)     Dizziness, weakness, headache 2. ONSET: When did this start? (minutes, hours, days; while sleeping)     Started 11/10/2023 3. LAST NORMAL: When was the last time you (the patient) were normal (no symptoms)?     11/09/2023 4. PATTERN Does this come and go, or has it been constant since it started?  Is it present now?     constant 5. CARDIAC SYMPTOMS: Have you had any of the following symptoms: chest pain, difficulty breathing, palpitations?     no 6. NEUROLOGIC SYMPTOMS: Have you had any of the following symptoms: headache, dizziness, vision loss, double vision, changes in speech, unsteady on your feet?     Dizziness, weakness 7. OTHER SYMPTOMS: Do you have any other symptoms?     headache  Protocols used: Neurologic Deficit-A-AH

## 2023-11-18 ENCOUNTER — Ambulatory Visit: Payer: Self-pay | Admitting: Emergency Medicine

## 2023-11-18 NOTE — Telephone Encounter (Addendum)
Copied from CRM [id number]. Topic: Clinical - Medical Advice >> Nov 18, 2023  4:11 PM Jon Gills C wrote: Reason for CRM: Patient called in would like to know she can get another refill on amoxicillin-clavulanate (AUGMENTIN) 875-125 MG tablet   Chief Complaint: Pneumonia Symptoms: Pain in lungs and swelling (already diagnosed and treated for pneumonia  Disposition: [] ED /[] Urgent Care (no appt availability in office) / [] Appointment(In office/virtual)/ []  [facility]/ [] Home Care/ [] Refused Recommended Disposition /[] [place] [facility]/ [x]  Follow-up with PCP  Additional Notes:  Patient was recently in hospital and treated for pneumonia. Patient's condition has improved, but not resolved. Her son wants to know if she can get more antibiotics because she is about to run out. She has 2 pills left. Recommended that patient be seen for follow up appointment to be evaluated first. Patient has appointment on 1/22. No sooner appt available. [employer] Interpreter ID: [email or web address] (phone call completed with both the son and interpreter on the line). Patient was not available at the time of call.  Answer Assessment - Initial Assessment Questions 1. NAME of MEDICINE: "What medicine(s) are you calling about?"  Augmentin  2. QUESTION: "What is your question?" (e.g., double dose of medicine, side effect)    Patient's son is inquiring if patient will be getting more antibiotics 3. PRESCRIBER: "Who prescribed the medicine?" Reason: if prescribed by specialist, call should be referred to that group.     Hospital Provider 4. SYMPTOMS: "Do you have any symptoms?" If Yes, ask: "What symptoms are you having?"  "How bad are the symptoms (e.g., mild, moderate, severe)     Pneumonia, dull pain and swelling in lungs (has improved since taking antibiotics  Protocols used: Medication Question Call-A-AH

## 2023-11-24 ENCOUNTER — Inpatient Hospital Stay: Payer: Medicaid Other | Admitting: Emergency Medicine

## 2023-11-29 ENCOUNTER — Ambulatory Visit (INDEPENDENT_AMBULATORY_CARE_PROVIDER_SITE_OTHER): Payer: Medicaid Other

## 2023-11-29 ENCOUNTER — Encounter: Payer: Self-pay | Admitting: Emergency Medicine

## 2023-11-29 ENCOUNTER — Other Ambulatory Visit: Payer: Self-pay | Admitting: Emergency Medicine

## 2023-11-29 ENCOUNTER — Ambulatory Visit (INDEPENDENT_AMBULATORY_CARE_PROVIDER_SITE_OTHER): Payer: Medicaid Other | Admitting: Emergency Medicine

## 2023-11-29 VITALS — BP 128/64 | HR 72 | Temp 98.5°F | Ht 68.0 in | Wt 162.0 lb

## 2023-11-29 DIAGNOSIS — J984 Other disorders of lung: Secondary | ICD-10-CM

## 2023-11-29 DIAGNOSIS — J479 Bronchiectasis, uncomplicated: Secondary | ICD-10-CM | POA: Diagnosis not present

## 2023-11-29 DIAGNOSIS — Z09 Encounter for follow-up examination after completed treatment for conditions other than malignant neoplasm: Secondary | ICD-10-CM | POA: Diagnosis not present

## 2023-11-29 DIAGNOSIS — E1169 Type 2 diabetes mellitus with other specified complication: Secondary | ICD-10-CM

## 2023-11-29 DIAGNOSIS — G4700 Insomnia, unspecified: Secondary | ICD-10-CM

## 2023-11-29 DIAGNOSIS — E785 Hyperlipidemia, unspecified: Secondary | ICD-10-CM

## 2023-11-29 DIAGNOSIS — Z7984 Long term (current) use of oral hypoglycemic drugs: Secondary | ICD-10-CM

## 2023-11-29 LAB — CBC WITH DIFFERENTIAL/PLATELET
Basophils Absolute: 0.1 10*3/uL (ref 0.0–0.1)
Basophils Relative: 1 % (ref 0.0–3.0)
Eosinophils Absolute: 0.4 10*3/uL (ref 0.0–0.7)
Eosinophils Relative: 4.8 % (ref 0.0–5.0)
HCT: 44.1 % (ref 36.0–46.0)
Hemoglobin: 14.4 g/dL (ref 12.0–15.0)
Lymphocytes Relative: 35 % (ref 12.0–46.0)
Lymphs Abs: 2.7 10*3/uL (ref 0.7–4.0)
MCHC: 32.7 g/dL (ref 30.0–36.0)
MCV: 86.7 fL (ref 78.0–100.0)
Monocytes Absolute: 0.6 10*3/uL (ref 0.1–1.0)
Monocytes Relative: 8 % (ref 3.0–12.0)
Neutro Abs: 4 10*3/uL (ref 1.4–7.7)
Neutrophils Relative %: 51.2 % (ref 43.0–77.0)
Platelets: 305 10*3/uL (ref 150.0–400.0)
RBC: 5.09 Mil/uL (ref 3.87–5.11)
RDW: 14.7 % (ref 11.5–15.5)
WBC: 7.8 10*3/uL (ref 4.0–10.5)

## 2023-11-29 LAB — COMPREHENSIVE METABOLIC PANEL
ALT: 15 U/L (ref 0–35)
AST: 22 U/L (ref 0–37)
Albumin: 4.8 g/dL (ref 3.5–5.2)
Alkaline Phosphatase: 90 U/L (ref 39–117)
BUN: 12 mg/dL (ref 6–23)
CO2: 31 meq/L (ref 19–32)
Calcium: 9.7 mg/dL (ref 8.4–10.5)
Chloride: 98 meq/L (ref 96–112)
Creatinine, Ser: 0.57 mg/dL (ref 0.40–1.20)
GFR: 91.67 mL/min (ref >60.00)
Glucose, Bld: 146 mg/dL — ABNORMAL HIGH (ref 70–99)
Potassium: 3.4 meq/L — ABNORMAL LOW (ref 3.5–5.1)
Sodium: 139 meq/L (ref 135–145)
Total Bilirubin: 0.3 mg/dL (ref 0.2–1.2)
Total Protein: 8.4 g/dL — ABNORMAL HIGH (ref 6.0–8.3)

## 2023-11-29 LAB — HEMOGLOBIN A1C: Hgb A1c MFr Bld: 8.1 % — ABNORMAL HIGH (ref 4.6–6.5)

## 2023-11-29 MED ORDER — DAPAGLIFLOZIN PROPANEDIOL 10 MG PO TABS: 10.0000 mg | ORAL_TABLET | Freq: Every day | ORAL | 3 refills | Status: DC

## 2023-11-29 MED ORDER — ZOLPIDEM TARTRATE 5 MG PO TABS: 5.0000 mg | ORAL_TABLET | Freq: Every evening | ORAL | 1 refills | Status: DC | PRN

## 2023-11-29 NOTE — Assessment & Plan Note (Signed)
Chronic stable conditions Continue Januvia, metformin, and glimepiride Continue rosuvastatin 20 mg daily Lipid profile done today Diet and nutrition discussed

## 2023-11-29 NOTE — Progress Notes (Signed)
Cheryl Hopkins 73 y.o.   Chief Complaint  Patient presents with   Hospitalization Follow-up    HFU had Iung infection. Patient states feeling a little better. Questions about her eyes, its been really watery and states they just dont feel good     HISTORY OF PRESENT ILLNESS: Accompanied by family member and interpreter. This is a 73 y.o. female Here for follow-up of emergency department visit on 11/11/2023 when she presented with dizziness and flank pain Workup revealed left lower lobe pneumonia and was started on Augmentin which she finished Feeling better. Also has history of bilateral cataract surgery about 1 year ago and since feeling her eyes grainy.  Needs follow-up Also requesting something mild to help her sleep No other complaints or medical concerns today  HPI   Prior to Admission medications   Medication Sig Start Date End Date Taking? Authorizing Provider  ACCU-CHEK GUIDE test strip USE 1 STRIP ONCE DAILY 02/17/23  Yes Georgina Quint, MD  Accu-Chek Softclix Lancets lancets SMARTSIG:1 Topical Daily 02/17/23  Yes Laquisha Northcraft, Eilleen Kempf, MD  amLODipine (NORVASC) 10 MG tablet Take 1 tablet (10 mg total) by mouth daily. 08/31/23 08/25/24 Yes Floyd Wade, Eilleen Kempf, MD  Blood Glucose Monitoring Suppl w/Device KIT 1 Device by Does not apply route daily. 02/17/23  Yes Ayriana Wix, Eilleen Kempf, MD  budesonide-formoterol [facility]) 80-4.5 MCG/ACT inhaler Inhale 2 puffs into the lungs 2 (two) times daily. 06/25/22  Yes Keahi Mccarney, Eilleen Kempf, MD  glimepiride (AMARYL) 2 MG tablet Take 1 tablet (2 mg total) by mouth daily with breakfast. TAKE ONE TABLET BY MOUTH WITH BREAKFAST 08/31/23 08/25/24 Yes Kery Batzel, Eilleen Kempf, MD  meloxicam (MOBIC) 15 MG tablet Take 1 tablet (15 mg total) by mouth daily. 08/31/23  Yes Georgina Quint, MD  metFORMIN (GLUCOPHAGE-XR) 500 MG 24 hr tablet Take 1 tablet (500 mg total) by mouth 2 (two) times daily with a meal. 08/31/23 08/25/24 Yes Madaline Lefeber, Eilleen Kempf, MD  Multiple Vitamin (MULTIVITAMIN) tablet Take 1 tablet by mouth daily.   Yes [provider]  rosuvastatin (CRESTOR) 20 MG tablet Take 1 tablet (20 mg total) by mouth daily. 08/31/23 08/25/24 Yes Marigrace Mccole, Eilleen Kempf, MD  sitaGLIPtin (JANUVIA) 50 MG tablet Take 1 tablet (50 mg total) by mouth daily. 08/31/23 08/25/24 Yes Pattrick Bady, Eilleen Kempf, MD  Spacer/Aero-Holding Chambers (AEROCHAMBER PLUS) inhaler Use as instructed 04/23/21  Yes Naveya Ellerman, Eilleen Kempf, MD  amoxicillin-clavulanate (AUGMENTIN) 875-125 MG tablet Take 1 tablet by mouth every 12 (twelve) hours. Patient not taking: Reported on 11/29/2023 11/11/23   Linwood Dibbles, MD  gabapentin (NEURONTIN) 300 MG capsule Take 1 capsule by mouth at bedtime Patient not taking: Reported on 11/29/2023 05/29/23   Georgina Quint, MD    No Known Allergies  Patient Active Problem List   Diagnosis Date Noted   Arthralgia of multiple joints 08/31/2023   Paresthesia of both lower extremities 05/20/2021   History of simple renal cyst 04/23/2021   Bronchiectasis without complication (HCC) 08/14/2020   Language barrier 08/14/2020   Mixed conductive and sensorineural hearing loss of left ear with restricted hearing of right ear 06/19/2020   Solitary pulmonary nodule 03/05/2020   Chronic lung disease 10/31/2015   Hypertension associated with diabetes (HCC) 10/31/2015   Dyslipidemia associated with type 2 diabetes mellitus (HCC) 09/30/2015   Hyperlipidemia 09/30/2015    Past Medical History:  Diagnosis Date   Arthritis    knee   Bronchitis    Diabetes mellitus without complication (HCC)    Several years.  Hyperlipidemia 09/30/2015   Hypertension    Stomach pain    with diarrhea    Past Surgical History:  Procedure Laterality Date   COLONOSCOPY      Social History   Socioeconomic History   Marital status: Widowed    Spouse name: Widowed since 2010   Number of children: 9   Years of education: <8   Highest education  level: Not on file  Occupational History   Occupation: Housewife  Tobacco Use   Smoking status: Never   Smokeless tobacco: Never   Tobacco comments:    Chews cola nut  Vaping Use   Vaping status: Never Used  Substance and Sexual Activity   Alcohol use: No    Alcohol/week: 0.0 standard drinks of alcohol   Drug use: No   Sexual activity: Not Currently  Other Topics Concern   Not on file  Social History Narrative      Widow   Originally from [place] - Came to [employer]. In 2016   Lives with son and his wife and their 3 children.        Exercise - no exercise      Seatbelt - 100%   Gun in home - no   Social Drivers of Corporate investment banker Strain: Not on file  Food Insecurity: Not on file  Transportation Needs: Not on file  Physical Activity: Not on file  Stress: Not on file  Social Connections: Not on file  Intimate Partner Violence: Not on file    Family History  Problem Relation Age of Onset   Bronchitis Sister    Hypertension Sister    Stroke Sister    Hepatitis Daughter    Colon cancer Neg Hx    Esophageal cancer Neg Hx    Rectal cancer Neg Hx    Stomach cancer Neg Hx      Review of Systems  Constitutional: Negative.  Negative for chills and fever.  HENT:  Negative for congestion and sore throat.   Respiratory: Negative.  Negative for cough and shortness of breath.   Cardiovascular: Negative.  Negative for chest pain and palpitations.  Gastrointestinal:  Negative for abdominal pain, diarrhea, nausea and vomiting.  Skin: Negative.  Negative for rash.  Neurological: Negative.  Negative for dizziness and headaches.  Psychiatric/Behavioral:  The patient has insomnia.   All other systems reviewed and are negative.   Vitals:   11/29/23 1057  BP: 128/64  Pulse: 72  Temp: 98.5 F (36.9 C)  SpO2: 92%    Physical Exam Vitals reviewed.  Constitutional:      Appearance: Normal appearance.  HENT:     Head: Normocephalic.     Mouth/Throat:     Mouth:  Mucous membranes are moist.     Pharynx: Oropharynx is clear.  Eyes:     Extraocular Movements: Extraocular movements intact.     Conjunctiva/sclera: Conjunctivae normal.     Pupils: Pupils are equal, round, and reactive to light.  Cardiovascular:     Rate and Rhythm: Normal rate and regular rhythm.     Pulses: Normal pulses.     Heart sounds: Normal heart sounds.  Pulmonary:     Effort: Pulmonary effort is normal.     Breath sounds: Rales (Bilateral dry crackles) present.  Musculoskeletal:     Cervical back: No tenderness.  Lymphadenopathy:     Cervical: No cervical adenopathy.  Skin:    General: Skin is warm and dry.     Capillary Refill:  Capillary refill takes less than 2 seconds.  Neurological:     Mental Status: She is alert and oriented to person, place, and time.  Psychiatric:        Mood and Affect: Mood normal.        Behavior: Behavior normal.    DG Chest 2 View Result Date: 11/29/2023 CLINICAL DATA:  Follow-up pneumonia EXAM: CHEST - 2 VIEW COMPARISON:  11/11/2023, 10/25/2021, 03/05/2020 FINDINGS: Normal heart size. Aortic atherosclerosis. Chronically coarsened interstitial markings bilaterally. Reticulonodular opacity at the left lung base reflecting site of chronic bronchiectasis and mucous impaction better seen on prior CT. No new airspace consolidation, pleural effusion, or pneumothorax. IMPRESSION: Chronic lung changes.  No new airspace consolidation. Electronically Signed   By: Duanne Guess D.O.   On: 11/29/2023 12:06     ASSESSMENT & PLAN: A total of 43 minutes was spent with the patient and counseling/coordination of care regarding preparing for this visit, review of most recent office visit notes, review of most recent emergency department visit notes, review of most recent chest imaging and today's x-ray, review of multiple chronic medical conditions and their management, review of all medications, review of most recent bloodwork results, review of health  maintenance items, education on nutrition, prognosis, documentation, and need for follow up.   Problem List Items Addressed This Visit       Respiratory   Chronic lung disease   Chronic stable condition      Relevant Orders   Comprehensive metabolic panel   CBC with Differential/Platelet   DG Chest 2 View (Completed)   Bronchiectasis without complication (HCC) - Primary   Clinically stable.  Stable vital signs.  No red flag signs or symptoms Recent community-acquired pneumonia much improved Treated successfully with Augmentin We will repeat x-ray today Blood work repeated today      Relevant Orders   Comprehensive metabolic panel   CBC with Differential/Platelet   DG Chest 2 View (Completed)     Endocrine   Dyslipidemia associated with type 2 diabetes mellitus (HCC)   Chronic stable conditions Continue Januvia, metformin, and glimepiride Continue rosuvastatin 20 mg daily Lipid profile done today Diet and nutrition discussed      Relevant Orders   Comprehensive metabolic panel   CBC with Differential/Platelet   DG Chest 2 View (Completed)   Hemoglobin A1c   Other Visit Diagnoses       Hospital discharge follow-up       Relevant Orders   Comprehensive metabolic panel   CBC with Differential/Platelet   DG Chest 2 View (Completed)     Insomnia, unspecified type       Relevant Medications   zolpidem (AMBIEN) 5 MG tablet      Patient Instructions  Bronchiectasis  Bronchiectasis is a condition in which the airways in the lungs (bronchi) are damaged and widened. The condition makes it hard for the lungs to get rid of mucus, and it causes mucus to gather in the bronchi. This condition often leads to lung infections, which can make the condition worse. What are the causes? You can be born with this condition, or you can develop it later in life. Common causes of this condition include: Cystic fibrosis. Repeated lung infections, such as pneumonia or  tuberculosis. An object or other blockage in the lungs. Breathing in fluid, food, or other objects (aspiration). A problem with the body's defense system (immune system) and lung structure that is present at birth (congenital). Sometimes the cause is not known. What  are the signs or symptoms? Common symptoms of this condition include: A daily cough that brings up mucus and lasts for more than 3 weeks. Lung infections that happen often. Shortness of breath and wheezing. Weakness and feeling tired (fatigue). How is this diagnosed? This condition is diagnosed with tests, such as: Chest X-rays or CT scans. These are done to check for changes in the lungs. Breathing tests. These are done to check how well your lungs are working. A test of a sample of your saliva (sputum culture). This test is done to check for infection. Blood tests and other tests. These are done to check for related diseases or causes. How is this treated? Treatment for this condition depends on the severity of the illness and its cause. Treatment may include: Medicines that loosen mucus so it can be coughed up (mucolytics). Medicines that relax the muscles of the bronchi (bronchodilators). Antibiotic medicines to prevent or treat infection. Physical therapy to help clear mucus from the lungs. Techniques may include: Postural drainage. This is when you sit or lie in certain positions so that mucus can drain by gravity. Chest percussion. This involves tapping the chest or back with a cupped hand. Chest vibration. For this therapy, a hand or special equipment vibrates your chest and back. Surgery to remove the affected part of the lung. This may be done in severe cases. Follow these instructions at home: Medicines Take over-the-counter and prescription medicines only as told by your health care provider. If you were prescribed an antibiotic medicine, take it as told by your health care provider. Do not stop taking the  antibiotic even if you start to feel better. Avoid taking sedatives and antihistamines unless your health care provider tells you to take them. These medicines tend to thicken the mucus in the lungs. Managing symptoms Do breathing exercises or techniques to clear your lungs as told by your health care provider. Consider using a cold steam vaporizer or humidifier in your room or home to help loosen secretions. If you have a cough that gets worse at night, try sleeping in a semi-upright position. General instructions Get plenty of rest. Drink enough fluid to keep your urine pale yellow. Stay inside when pollution and ozone levels are high. Stay up to date with vaccinations and immunizations. Avoid cigarette smoke and other lung irritants. Do not use any products that contain nicotine or tobacco. These products include cigarettes, chewing tobacco, and vaping devices, such as e-cigarettes. If you need help quitting, ask your health care provider. Keep all follow-up visits. This is important. Contact a health care provider if: You cough up more sputum than before and the sputum is yellow or green in color. You have a fever or chills. You cannot control your cough and are losing sleep. Get help right away if: You cough up blood. You have chest pain. You have increasing shortness of breath. You have pain that gets worse or is not controlled with medicines. You have a fever and your symptoms suddenly get worse. These symptoms may be an emergency. Get help right away. Call 911. Do not wait to see if the symptoms will go away. Do not drive yourself to the hospital. Summary Bronchiectasis is a condition in which the airways in the lungs (bronchi) are damaged and widened. The condition makes it hard for the lungs to get rid of mucus, and it causes mucus to gather in the bronchi. Treatment usually includes therapy to help clear mucus from the lungs. Avoid cigarette smoke  and other lung  irritants. Stay up to date with vaccinations and immunizations. This information is not intended to replace advice given to you by your health care provider. Make sure you discuss any questions you have with your health care provider. Document Revised: 07/02/2021 Document Reviewed: 05/20/2021 Elsevier Patient Education  2024 Elsevier Inc.    Edwina Barth, MD [facility]

## 2023-11-29 NOTE — Patient Instructions (Signed)
Bronchiectasis  Bronchiectasis is a condition in which the airways in the lungs (bronchi) are damaged and widened. The condition makes it hard for the lungs to get rid of mucus, and it causes mucus to gather in the bronchi. This condition often leads to lung infections, which can make the condition worse. What are the causes? You can be born with this condition, or you can develop it later in life. Common causes of this condition include: Cystic fibrosis. Repeated lung infections, such as pneumonia or tuberculosis. An object or other blockage in the lungs. Breathing in fluid, food, or other objects (aspiration). A problem with the body's defense system (immune system) and lung structure that is present at birth (congenital). Sometimes the cause is not known. What are the signs or symptoms? Common symptoms of this condition include: A daily cough that brings up mucus and lasts for more than 3 weeks. Lung infections that happen often. Shortness of breath and wheezing. Weakness and feeling tired (fatigue). How is this diagnosed? This condition is diagnosed with tests, such as: Chest X-rays or CT scans. These are done to check for changes in the lungs. Breathing tests. These are done to check how well your lungs are working. A test of a sample of your saliva (sputum culture). This test is done to check for infection. Blood tests and other tests. These are done to check for related diseases or causes. How is this treated? Treatment for this condition depends on the severity of the illness and its cause. Treatment may include: Medicines that loosen mucus so it can be coughed up (mucolytics). Medicines that relax the muscles of the bronchi (bronchodilators). Antibiotic medicines to prevent or treat infection. Physical therapy to help clear mucus from the lungs. Techniques may include: Postural drainage. This is when you sit or lie in certain positions so that mucus can drain by gravity. Chest  percussion. This involves tapping the chest or back with a cupped hand. Chest vibration. For this therapy, a hand or special equipment vibrates your chest and back. Surgery to remove the affected part of the lung. This may be done in severe cases. Follow these instructions at home: Medicines Take over-the-counter and prescription medicines only as told by your health care provider. If you were prescribed an antibiotic medicine, take it as told by your health care provider. Do not stop taking the antibiotic even if you start to feel better. Avoid taking sedatives and antihistamines unless your health care provider tells you to take them. These medicines tend to thicken the mucus in the lungs. Managing symptoms Do breathing exercises or techniques to clear your lungs as told by your health care provider. Consider using a cold steam vaporizer or humidifier in your room or home to help loosen secretions. If you have a cough that gets worse at night, try sleeping in a semi-upright position. General instructions Get plenty of rest. Drink enough fluid to keep your urine pale yellow. Stay inside when pollution and ozone levels are high. Stay up to date with vaccinations and immunizations. Avoid cigarette smoke and other lung irritants. Do not use any products that contain nicotine or tobacco. These products include cigarettes, chewing tobacco, and vaping devices, such as e-cigarettes. If you need help quitting, ask your health care provider. Keep all follow-up visits. This is important. Contact a health care provider if: You cough up more sputum than before and the sputum is yellow or green in color. You have a fever or chills. You cannot control your  cough and are losing sleep. Get help right away if: You cough up blood. You have chest pain. You have increasing shortness of breath. You have pain that gets worse or is not controlled with medicines. You have a fever and your symptoms suddenly get  worse. These symptoms may be an emergency. Get help right away. Call 911. Do not wait to see if the symptoms will go away. Do not drive yourself to the hospital. Summary Bronchiectasis is a condition in which the airways in the lungs (bronchi) are damaged and widened. The condition makes it hard for the lungs to get rid of mucus, and it causes mucus to gather in the bronchi. Treatment usually includes therapy to help clear mucus from the lungs. Avoid cigarette smoke and other lung irritants. Stay up to date with vaccinations and immunizations. This information is not intended to replace advice given to you by your health care provider. Make sure you discuss any questions you have with your health care provider. Document Revised: 07/02/2021 Document Reviewed: 05/20/2021 Elsevier Patient Education  2024 [employer].

## 2023-11-29 NOTE — Assessment & Plan Note (Signed)
Clinically stable.  Stable vital signs.  No red flag signs or symptoms Recent community-acquired pneumonia much improved Treated successfully with Augmentin We will repeat x-ray today Blood work repeated today

## 2023-11-29 NOTE — Assessment & Plan Note (Signed)
Chronic stable condition

## 2023-11-30 NOTE — Progress Notes (Signed)
Do not recommend refill on Augmentin

## 2023-12-01 ENCOUNTER — Telehealth: Payer: Self-pay

## 2023-12-01 ENCOUNTER — Other Ambulatory Visit (HOSPITAL_COMMUNITY): Payer: Self-pay

## 2023-12-01 NOTE — Telephone Encounter (Signed)
Pharmacy Patient Advocate Encounter  Received notification from [facility] Medicaid that Prior Authorization for Farxiga 10MG  tablets has been APPROVED from 12/01/23 to 11/30/24. Ran test claim, Copay is $4. This test claim was processed through [facility]- copay amounts may vary at other pharmacies due to pharmacy/plan contracts, or as the patient moves through the different stages of their insurance plan.   PA #/Case ID/Reference #: [id number]  *spoke with [facility] pharmacy

## 2023-12-01 NOTE — Telephone Encounter (Signed)
Pharmacy Patient Advocate Encounter   Received notification from  [facility] Portal that prior authorization for Farxiga 10MG  tablets is required/requested.   Insurance verification completed.   The patient is insured through [facility] [place] .   Per test claim: PA required; PA submitted to above mentioned insurance via CoverMyMeds Key/confirmation #/EOC [id number] Status is pending

## 2023-12-03 ENCOUNTER — Encounter: Payer: Self-pay | Admitting: Radiology

## 2023-12-03 ENCOUNTER — Other Ambulatory Visit: Payer: Self-pay | Admitting: Emergency Medicine

## 2023-12-03 DIAGNOSIS — M2550 Pain in unspecified joint: Secondary | ICD-10-CM

## 2023-12-29 ENCOUNTER — Other Ambulatory Visit: Payer: Self-pay | Admitting: Emergency Medicine

## 2023-12-29 DIAGNOSIS — M2550 Pain in unspecified joint: Secondary | ICD-10-CM

## 2024-02-29 ENCOUNTER — Ambulatory Visit: Payer: Medicaid Other | Admitting: Emergency Medicine

## 2024-03-08 ENCOUNTER — Ambulatory Visit: Payer: Self-pay | Admitting: Emergency Medicine

## 2024-03-08 NOTE — Telephone Encounter (Signed)
 Chief Complaint: Left arm pain at flu shot injection site  Symptoms: Redness, 8-9/10 pain level constant, numbness, "feels like water in arm" Pertinent Negatives: Patient denies swelling  Disposition: [x] ED  Additional Notes: Spoke with pt's son, Janan Mead. Pt got the flu shot a few weeks ago and her left arm has hurt since then. Pt has constant pain and numbness. This RN advised pt son to take pt to ED now. Pt son states understanding.    Copied from CRM [id number]. Topic: Clinical - Red Word Triage >> Mar 08, 2024 11:31 AM Lovett Ruck C wrote: Red Word that prompted transfer to Nurse Triage: patient's son Janan Mead is on the phone for his mother and he stated that his mother's arm is in pain. She did get a flu shot in that arm, however that they just want to see if they need to come in and have it looked at or what they need to do Reason for Disposition  [1] SEVERE pain AND [2] not improved 2 hours after pain medicine  Answer Assessment - Initial Assessment Questions Chief Complaint: Left arm pain at flu shot injection site  Symptoms: Redness, 8-9/10 pain level constant, numbness, "feels like water in arm"  Pertinent Negatives: Patient denies swelling  Protocols used: Arm Pain-A-AH

## 2024-03-16 ENCOUNTER — Encounter (HOSPITAL_COMMUNITY): Payer: Self-pay | Admitting: Emergency Medicine

## 2024-03-16 ENCOUNTER — Emergency Department (HOSPITAL_COMMUNITY)

## 2024-03-16 ENCOUNTER — Other Ambulatory Visit: Payer: Self-pay

## 2024-03-16 ENCOUNTER — Emergency Department (HOSPITAL_COMMUNITY)
Admission: EM | Admit: 2024-03-16 | Discharge: 2024-03-17 | Disposition: A | Discharge status: Home / Self Care | Attending: Emergency Medicine | Admitting: Emergency Medicine

## 2024-03-16 DIAGNOSIS — M25512 Pain in left shoulder: Secondary | ICD-10-CM | POA: Diagnosis present

## 2024-03-16 DIAGNOSIS — E119 Type 2 diabetes mellitus without complications: Secondary | ICD-10-CM | POA: Insufficient documentation

## 2024-03-16 DIAGNOSIS — I10 Essential (primary) hypertension: Secondary | ICD-10-CM | POA: Diagnosis not present

## 2024-03-16 DIAGNOSIS — Z79899 Other long term (current) drug therapy: Secondary | ICD-10-CM | POA: Insufficient documentation

## 2024-03-16 DIAGNOSIS — M7532 Calcific tendinitis of left shoulder: Secondary | ICD-10-CM | POA: Insufficient documentation

## 2024-03-16 DIAGNOSIS — Z7984 Long term (current) use of oral hypoglycemic drugs: Secondary | ICD-10-CM | POA: Insufficient documentation

## 2024-03-16 NOTE — ED Triage Notes (Signed)
 Pt in with pain to L upper arm, states ongoing since the Flu shot back in September. Pt states it has suddenly gotten worse the past 3 days, radiates to L shoulder and bicep. "Sometimes I have to lift it up with my other hand it hurts so bad". Denies any cp, sob or injuries.

## 2024-03-17 MED ORDER — ACETAMINOPHEN 325 MG PO TABS
650.0000 mg | ORAL_TABLET | Freq: Once | ORAL | Status: AC
  Administered 2024-03-17: 650 mg via ORAL
  Filled 2024-03-17: qty 2

## 2024-03-17 MED ORDER — LIDOCAINE 5 % EX PTCH
1.0000 | MEDICATED_PATCH | CUTANEOUS | Status: DC
  Administered 2024-03-17: 1 via TRANSDERMAL
  Filled 2024-03-17: qty 1

## 2024-03-17 NOTE — Discharge Instructions (Addendum)
 You may apply ice for further inflammation reduction. Please stretch this shoulder at least 3 times a day to prevent Adhesive Capsulitis. You can do this by stretching your arm up high as well as stretching the arm frontwards and backwards. When you are watching TV, you can also rest your arm up high for passive stretching. Please follow up with orthopedics.  You can also find over-the-counter topical pain relief at your nearby drug store.  Alternating between 650 mg Tylenol and 400 mg Advil: The best way to alternate taking Acetaminophen (example Tylenol) and Ibuprofen (example Advil/Motrin) is to take them 3 hours apart. For example, if you take ibuprofen at 6 am you can then take Tylenol at 9 am. You can continue this regimen throughout the day, making sure you do not exceed the recommended maximum dose for each drug.    Kuna iya shafa kankara don ?arin rage kumburi. Da fatan Cheryl Hopkins a shimfi?a wannan kafa?a a?alla sau 3 a rana don hana Adhesive Capsulitis. Kuna iya yin haka ta hanyar mi?e hannunku sama sama da kuma shimfi?a hannun gaba da baya. Lokacin da kake kallon talabijin, zaka iya kuma sanya hannunka sama sama don mi?ewa. Da fatan Cheryl Hopkins a bi da [employer].  Hakanan zaka iya samun maganin rage jin zafi na kan-da-counter a kantin magani na kusa.  Canjin tsakanin 650 MG Tylenol da 400 MG Advil: Hanya mafi kyau don maye gurbin shan Acetaminophen (misali Tylenol) da Ibuprofen (misali Advil/Motrin) shine ?aukar su 3 hours baya. Misali, idan ka sha ibuprofen da karfe 6 na safe zaka iya shan Tylenol da karfe 9 na safe. Kuna iya ci gaba da wannan tsarin a ko'ina cikin yini, tabbatar da cewa ba ku wuce iyakar adadin da aka ba da shawarar ga kowane magani ba.

## 2024-03-17 NOTE — ED Provider Notes (Signed)
 [place] [facility] Provider Note   CSN: [id number] Arrival date & time: 03/16/24  1849     History  Chief Complaint  Patient presents with   Arm Pain    Cheryl Hopkins is a 73 y.o. female with PMHx HLD, OA, chronic lung disease, DM, HTN who presents to ED concerned for left shoulder/upper bicep pain for many months - the pain worsened in the last 3 days. No recent trauma. Patient has not taken any medication for her pain recently. No other complaint today.   [employer] #[id number].   Arm Pain       Home Medications Prior to Admission medications   Medication Sig Start Date End Date Taking? Authorizing Provider  ACCU-CHEK GUIDE test strip USE 1 STRIP ONCE DAILY 02/17/23   Elvira Hammersmith, MD  Accu-Chek Softclix Lancets lancets SMARTSIG:1 Topical Daily 02/17/23   Sagardia, Miguel Jose, MD  amLODipine  (NORVASC ) 10 MG tablet Take 1 tablet (10 mg total) by mouth daily. 08/31/23 08/25/24  Sagardia, Miguel Jose, MD  amoxicillin -clavulanate (AUGMENTIN ) 875-125 MG tablet Take 1 tablet by mouth every 12 (twelve) hours. Patient not taking: Reported on 11/29/2023 11/11/23   Trish Furl, MD  Blood Glucose Monitoring Suppl w/Device KIT 1 Device by Does not apply route daily. 02/17/23   Sagardia, Miguel Jose, MD  budesonide -formoterol  (SYMBICORT ) 80-4.5 MCG/ACT inhaler Inhale 2 puffs into the lungs 2 (two) times daily. 06/25/22   Elvira Hammersmith, MD  dapagliflozin  propanediol (FARXIGA ) 10 MG TABS tablet Take 1 tablet (10 mg total) by mouth daily before breakfast. 11/29/23   Elvira Hammersmith, MD  gabapentin  (NEURONTIN ) 300 MG capsule Take 1 capsule by mouth at bedtime Patient not taking: Reported on 11/29/2023 05/29/23   Sagardia, Miguel Jose, MD  glimepiride  (AMARYL ) 2 MG tablet Take 1 tablet (2 mg total) by mouth daily with breakfast. TAKE ONE TABLET BY MOUTH WITH BREAKFAST 08/31/23 08/25/24  Elvira Hammersmith, MD  meloxicam  (MOBIC ) 15  MG tablet Take 1 tablet by mouth once daily 12/29/23   Sagardia, Miguel Jose, MD  metFORMIN  (GLUCOPHAGE -XR) 500 MG 24 hr tablet Take 1 tablet (500 mg total) by mouth 2 (two) times daily with a meal. 08/31/23 08/25/24  Elvira Hammersmith, MD  Multiple Vitamin (MULTIVITAMIN) tablet Take 1 tablet by mouth daily.    [provider]  rosuvastatin  (CRESTOR ) 20 MG tablet Take 1 tablet (20 mg total) by mouth daily. 08/31/23 08/25/24  Elvira Hammersmith, MD  sitaGLIPtin  (JANUVIA ) 50 MG tablet Take 1 tablet (50 mg total) by mouth daily. 08/31/23 08/25/24  Elvira Hammersmith, MD  Spacer/Aero-Holding Chambers (AEROCHAMBER PLUS) inhaler Use as instructed 04/23/21   Elvira Hammersmith, MD  zolpidem  (AMBIEN ) 5 MG tablet Take 1 tablet (5 mg total) by mouth at bedtime as needed for sleep. 11/29/23   Sagardia, Miguel Jose, MD      Allergies    Patient has no known allergies.    Review of Systems   Review of Systems  Musculoskeletal:        Shoulder pain    Physical Exam Updated Vital Signs BP 125/68 (BP Location: Right Arm)   Pulse 78   Temp 98.3 F (36.8 C)   Resp 18   Wt 73.5 kg   LMP 11/02/2010 (Approximate) Comment: postmenopausal  SpO2 (!) 89%   BMI 24.64 kg/m  Physical Exam Vitals and nursing note reviewed.  Constitutional:      General: She is not in acute distress.  Appearance: She is not ill-appearing or toxic-appearing.  HENT:     Head: Normocephalic and atraumatic.  Eyes:     General: No scleral icterus.       Right eye: No discharge.        Left eye: No discharge.     Conjunctiva/sclera: Conjunctivae normal.  Cardiovascular:     Rate and Rhythm: Normal rate and regular rhythm.     Heart sounds: Normal heart sounds.  Pulmonary:     Effort: Pulmonary effort is normal. No respiratory distress.     Breath sounds: Normal breath sounds. No wheezing or rales.  Abdominal:     General: Abdomen is flat.  Musculoskeletal:     Comments: Patient able to abduct  shoulder to 90 degrees. Tenderness to palpation of left superolateral shoulder. No swelling, erythema, or increased warmth appreciated. +2 radial pulse. Area non-tense. Sensation to light touch intact.  Skin:    General: Skin is warm and dry.  Neurological:     General: No focal deficit present.     Mental Status: She is alert. Mental status is at baseline.  Psychiatric:        Mood and Affect: Mood normal.        Behavior: Behavior normal.     ED Results / Procedures / Treatments   Labs (all labs ordered are listed, but only abnormal results are displayed) Labs Reviewed - No data to display  EKG EKG Interpretation Date/Time:  Thursday Mar 16 2024 19:26:52 EDT Ventricular Rate:  86 PR Interval:  174 QRS Duration:  70 QT Interval:  368 QTC Calculation: 440 R Axis:   58  Text Interpretation: Normal sinus rhythm Normal ECG When compared with ECG of 11-Nov-2023 18:58, PREVIOUS ECG IS PRESENT Confirmed by [employer] ([id number]) on 03/17/2024 8:12:54 AM  Radiology DG Shoulder Left Result Date: 03/16/2024 CLINICAL DATA:  Left shoulder pain EXAM: LEFT SHOULDER - 2+ VIEW COMPARISON:  None Available. FINDINGS: Normal alignment. No acute fracture or dislocation. Acromioclavicular joint space is preserved. Glenohumeral joint space is preserved. Mild calcific tendinitis of the insertion of the rotator cuff upon the greater tuberosity. Limited evaluation of the left hemithorax is unremarkable. IMPRESSION: 1. Mild calcific tendinitis. Electronically Signed   By: Worthy Heads M.D.   On: 03/16/2024 20:54    Procedures Procedures    Medications Ordered in ED Medications  acetaminophen (TYLENOL) tablet 650 mg (has no administration in time range)  lidocaine (LIDODERM) 5 % 1 patch (has no administration in time range)    ED Course/ Medical Decision Making/ A&P                                 Medical Decision Making Amount and/or Complexity of Data Reviewed Radiology:  ordered.  Risk OTC drugs. Prescription drug management.   This patient presents to the ED for concern of left shoulder pain, this involves an extensive number of treatment options, and is a complaint that carries with it a high risk of complications and morbidity.  The differential diagnosis includes hemarthrosis, gout, septic joint, fracture, tendonitis, muscle strain, bursitis, compartment syndrome   Co morbidities that complicate the patient evaluation  HLD, OA, chronic lung disease, DM, HTN   Additional history obtained:  Dr. Vedia Geralds PCP   Problem List / ED Course / Critical interventions / Medication management  Patient presents to ED concern for chronic left shoulder pain worsening over the past 3 days.  No other concerns today.  She has not taken anything for her pain today.  Will provide her with the Tylenol. Patient's vitals are slightly concerning for hypoxia in the lower 90s.  There is one documented O2 of 89% on RA. Chart review showing that patient has chronic lung disease and her O2 baseline is around 91% - this does not seem to be an acute concern today.  Rest physical exam reassuring.  Patient afebrile and without signs of infection. I ordered imaging studies including calcific tendinitis. I independently visualized and interpreted imaging. I agree with the radiologist interpretation.  Shared results with patient.  Answered all questions.  Educated patient on alternating Advil and Tylenol.  Recommended patient follow-up with her already established orthopedic provider.  Patient verbalized understanding with plan. Staffed with Dr. Manus Sellers I have reviewed the patients home medicines and have made adjustments as needed The patient has been appropriately medically screened and/or stabilized in the ED. I have low suspicion for any other emergent medical condition which would require further screening, evaluation or treatment in the ED or require inpatient management. At time  of discharge the patient is hemodynamically stable and in no acute distress. I have discussed work-up results and diagnosis with patient and answered all questions. Patient is agreeable with discharge plan. We discussed strict return precautions for returning to the emergency department and they verbalized understanding.     Ddx these are considered less likely due to history of present illness and physical exam -hemarthrosis: joint without swelling; ROM intact -gout: no warmth or erythema; ROM intact  -septic joint: afebrile; no warmth or erythema; no skin changes; ROM intact  -fracture: xray without concern  -compartment syndrome: area not tense; neurovascularly intact   Social Determinants of Health:  geriatric         Final Clinical Impression(s) / ED Diagnoses Final diagnoses:  Calcific tendinitis of left shoulder    Rx / DC Orders ED Discharge Orders     None         [place], [place] 03/17/24 4098    Tegeler, Marine Sia, MD 03/17/24 [phone]

## 2024-03-25 ENCOUNTER — Emergency Department (HOSPITAL_COMMUNITY)

## 2024-03-25 ENCOUNTER — Inpatient Hospital Stay (HOSPITAL_COMMUNITY)
Admission: EM | Admit: 2024-03-25 | Discharge: 2024-03-30 | DRG: 956 | Disposition: A | Discharge status: Home Health | Attending: Internal Medicine | Admitting: Internal Medicine

## 2024-03-25 ENCOUNTER — Encounter (HOSPITAL_COMMUNITY): Payer: Self-pay | Admitting: Emergency Medicine

## 2024-03-25 DIAGNOSIS — I152 Hypertension secondary to endocrine disorders: Secondary | ICD-10-CM | POA: Diagnosis present

## 2024-03-25 DIAGNOSIS — W1830XA Fall on same level, unspecified, initial encounter: Secondary | ICD-10-CM | POA: Diagnosis present

## 2024-03-25 DIAGNOSIS — Z7984 Long term (current) use of oral hypoglycemic drugs: Secondary | ICD-10-CM | POA: Diagnosis not present

## 2024-03-25 DIAGNOSIS — D72829 Elevated white blood cell count, unspecified: Secondary | ICD-10-CM | POA: Diagnosis present

## 2024-03-25 DIAGNOSIS — Z79899 Other long term (current) drug therapy: Secondary | ICD-10-CM

## 2024-03-25 DIAGNOSIS — E1159 Type 2 diabetes mellitus with other circulatory complications: Secondary | ICD-10-CM | POA: Diagnosis present

## 2024-03-25 DIAGNOSIS — Y9239 Other specified sports and athletic area as the place of occurrence of the external cause: Secondary | ICD-10-CM

## 2024-03-25 DIAGNOSIS — Z825 Family history of asthma and other chronic lower respiratory diseases: Secondary | ICD-10-CM

## 2024-03-25 DIAGNOSIS — Z791 Long term (current) use of non-steroidal anti-inflammatories (NSAID): Secondary | ICD-10-CM

## 2024-03-25 DIAGNOSIS — E119 Type 2 diabetes mellitus without complications: Secondary | ICD-10-CM

## 2024-03-25 DIAGNOSIS — E785 Hyperlipidemia, unspecified: Secondary | ICD-10-CM | POA: Diagnosis present

## 2024-03-25 DIAGNOSIS — E7800 Pure hypercholesterolemia, unspecified: Secondary | ICD-10-CM

## 2024-03-25 DIAGNOSIS — Z8249 Family history of ischemic heart disease and other diseases of the circulatory system: Secondary | ICD-10-CM

## 2024-03-25 DIAGNOSIS — I10 Essential (primary) hypertension: Secondary | ICD-10-CM | POA: Diagnosis not present

## 2024-03-25 DIAGNOSIS — Z823 Family history of stroke: Secondary | ICD-10-CM | POA: Diagnosis not present

## 2024-03-25 DIAGNOSIS — S72002A Fracture of unspecified part of neck of left femur, initial encounter for closed fracture: Principal | ICD-10-CM

## 2024-03-25 DIAGNOSIS — Z7401 Bed confinement status: Secondary | ICD-10-CM | POA: Diagnosis not present

## 2024-03-25 DIAGNOSIS — S32592A Other specified fracture of left pubis, initial encounter for closed fracture: Secondary | ICD-10-CM | POA: Diagnosis present

## 2024-03-25 DIAGNOSIS — M25552 Pain in left hip: Secondary | ICD-10-CM | POA: Diagnosis not present

## 2024-03-25 DIAGNOSIS — R000 Tachycardia, unspecified: Secondary | ICD-10-CM | POA: Diagnosis not present

## 2024-03-25 DIAGNOSIS — Z7951 Long term (current) use of inhaled steroids: Secondary | ICD-10-CM

## 2024-03-25 DIAGNOSIS — J479 Bronchiectasis, uncomplicated: Secondary | ICD-10-CM

## 2024-03-25 HISTORY — DX: Dyspnea, unspecified: R06.00

## 2024-03-25 HISTORY — DX: Gastro-esophageal reflux disease without esophagitis: K21.9

## 2024-03-25 LAB — BASIC METABOLIC PANEL WITH GFR
Anion gap: 10 (ref 5–15)
BUN: 12 mg/dL (ref 8–23)
CO2: 27 mmol/L (ref 22–32)
Calcium: 9.1 mg/dL (ref 8.9–10.3)
Chloride: 99 mmol/L (ref 98–111)
Creatinine, Ser: 0.83 mg/dL (ref 0.44–1.00)
GFR, Estimated: >60 mL/min (ref >60)
Glucose, Bld: 200 mg/dL — ABNORMAL HIGH (ref 70–99)
Potassium: 3.7 mmol/L (ref 3.5–5.1)
Sodium: 136 mmol/L (ref 135–145)

## 2024-03-25 MED ORDER — FENTANYL CITRATE PF 50 MCG/ML IJ SOSY
50.0000 ug | PREFILLED_SYRINGE | Freq: Once | INTRAMUSCULAR | Status: AC
  Administered 2024-03-25: 50 ug via INTRAVENOUS
  Filled 2024-03-25: qty 1

## 2024-03-25 NOTE — ED Triage Notes (Signed)
 Pt presents to the ED via [facility] with complaints of L hip/leg pain following a fall tonight at the gym. Pt was sitting on a bench that wasn't secured and landed on her L hip. No shortening nor outward rotation noted. Endorses pain when bearing weight to the extremity. A&Ox4 at this time. Denies hitting her head,  CP or SOB.

## 2024-03-25 NOTE — Consult Note (Signed)
 Orthopedic Surgery Consult Note  Assessment: Patient is a 73 y.o. female with left femoral neck fracture   Plan: -Planning for operative fixation today -Diet: NPO for procedure -DVT ppx: aspirin 81mg  BID post-operatively -Ancef and TXA on call to OR -Weight bearing status: NWB LLE -PT evaluate and treat post-operatively -Pain control -Dispo: pending completion of operative plans   Discussed recommendation for operative intervention in the form of left femoral neck percutaneous pinning. Explained the risks of this procedure included, but were not limited to: nonunion, malunion, hardware failure, infection, bleeding, stiffness, avascular necrosis, need for additional procedures, deep vein thrombosis, pulmonary embolism, and death. Explained that her risks would be higher, particularly infection, given her elevated A1c. The benefits of this procedure would be to promote fracture healing by providing stability and to allow for mobilization. The alternatives of this surgery would be to treat the fracture with immobilization with bedrest or to do no intervention. The patient's questions were answered to her satisfaction. After this discussion, patient elected to proceed with surgery. Informed consent was obtained.   ___________________________________________________________________________   Reason for consult: left femoral neck fracture  History:  Patient is a 73 y.o. female who had a fall at the gym and noted immediate onset of left hip pain. She said she was on a piece of equipment that wasn't secured and fell to the ground, landing on her left side. She presented to the [facility] ED and was found to have a left femoral neck fracture. She was admitted to medicine and transferred to [facility]. She is reporting left hip pain. No pain elsewhere. Denies paresthesias and numbness.   Review of systems: General: denies fevers and chills, myalgias Neurologic: denies recent changes in vision, slurred  speech Abdomen: denies nausea, vomiting, hematemesis Respiratory: denies cough, shortness of breath  Past medical history:  DM (last A1c was 8.1 on 11/29/2023) HTN HLD  Allergies: NKDA   Past surgical history:  None  Social history: Denies use of nicotine-containing products (cigarettes, vaping, smokeless, etc.) Alcohol use: denies Denies use of recreational drugs  Family history: -reviewed and not pertinent to femoral neck fracture   Physical Exam:  BMI of 24.6  General: no acute distress, appears stated age Neurologic: alert, answering questions appropriately, following commands Cardiovascular: regular rate, no cyanosis Respiratory: unlabored breathing on room air, symmetric chest rise Psychiatric: appropriate affect, normal cadence to speech  MSK:   -Bilateral upper extremities  No tenderness to palpation over extremity, no gross deformity, no open wounds Fires deltoid, biceps, triceps, wrist extensors, wrist flexors, finger extensors, finger flexors  AIN/PIN/IO intact  Palpable radial pulse  Sensation intact to light touch in median/ulnar/radial/axillary nerve distributions  Hand warm and well perfused  -Right lower extremity  No tenderness to palpation over extremity, no pain with log roll, no gross deformity, no open wounds Fires hip flexors, quadriceps, hamstrings, tibialis anterior, gastrocnemius and soleus, extensor hallucis longus Plantarflexes and dorsiflexes toes Sensation intact to light touch in sural, saphenous, tibial, deep peroneal, and superficial peroneal nerve distributions Foot warm and well perfused, palpable DP pulse  -Left lower extremity   No tenderness to palpation over extremity, except over the hip. Pain with log roll. No gross deformity, no open wounds Fires quadriceps, hamstrings, tibialis anterior, gastrocnemius and soleus, extensor hallucis longus. Does not fire hip flexors due to pain Plantarflexes and dorsiflexes toes Sensation  intact to light touch in sural, saphenous, tibial, deep peroneal, and superficial peroneal nerve distributions Foot warm and well perfused, palpable DP pulse  Imaging: XRs of the left hip from 03/25/2024 was independently reviewed and interpreted, showing a nondisplaced femoral neck fracture. No other fractures seen. No dislocation seen. Lateral does not demonstrate the fracture well.  CT of the left hip from 03/25/2024 was independently reviewed and interpreted, showing a nondisplaced left femoral neck fracture. No other fractures seen.    Patient name: Cheryl Hopkins Patient MRN: [id number] Date: 03/26/2024

## 2024-03-25 NOTE — ED Notes (Signed)
 Pt o2 85 on room air pt placed on 4L [place] pt O2 up to 92%. MD notified

## 2024-03-25 NOTE — ED Provider Notes (Signed)
 [place] [facility] AT [facility] Provider Note   CSN: [id number] Arrival date & time: 03/25/24  2104     History Chief Complaint  Patient presents with   Cheryl Hopkins is a 73 y.o. female patient with history of hyperlipidemia, diabetes, hypertension who presents to the emerged from today for further evaluation of left hip pain.  Patient states that she was at the gym working out and she went to go lift some weights in sat down on the bench funny and there was also missing a screw and she fell onto her left side.  She did not hit her head or lose consciousness.  Since then she has been having left hip pain that radiates down the left leg.   Fall       Home Medications Prior to Admission medications   Medication Sig Start Date End Date Taking? Authorizing Provider  ACCU-CHEK GUIDE test strip USE 1 STRIP ONCE DAILY 02/17/23   Elvira Hammersmith, MD  Accu-Chek Softclix Lancets lancets SMARTSIG:1 Topical Daily 02/17/23   Sagardia, Miguel Jose, MD  amLODipine  (NORVASC ) 10 MG tablet Take 1 tablet (10 mg total) by mouth daily. 08/31/23 08/25/24  Elvira Hammersmith, MD  amoxicillin -clavulanate (AUGMENTIN ) 875-125 MG tablet Take 1 tablet by mouth every 12 (twelve) hours. Patient not taking: Reported on 11/29/2023 11/11/23   Trish Furl, MD  Blood Glucose Monitoring Suppl w/Device KIT 1 Device by Does not apply route daily. 02/17/23   Sagardia, Miguel Jose, MD  budesonide -formoterol  (SYMBICORT ) 80-4.5 MCG/ACT inhaler Inhale 2 puffs into the lungs 2 (two) times daily. 06/25/22   Elvira Hammersmith, MD  dapagliflozin  propanediol (FARXIGA ) 10 MG TABS tablet Take 1 tablet (10 mg total) by mouth daily before breakfast. 11/29/23   Elvira Hammersmith, MD  gabapentin  (NEURONTIN ) 300 MG capsule Take 1 capsule by mouth at bedtime Patient not taking: Reported on 11/29/2023 05/29/23   Sagardia, Miguel Jose, MD  glimepiride  (AMARYL ) 2 MG tablet Take 1 tablet (2 mg total)  by mouth daily with breakfast. TAKE ONE TABLET BY MOUTH WITH BREAKFAST 08/31/23 08/25/24  Elvira Hammersmith, MD  meloxicam  (MOBIC ) 15 MG tablet Take 1 tablet by mouth once daily 12/29/23   Sagardia, Miguel Jose, MD  metFORMIN  (GLUCOPHAGE -XR) 500 MG 24 hr tablet Take 1 tablet (500 mg total) by mouth 2 (two) times daily with a meal. 08/31/23 08/25/24  Elvira Hammersmith, MD  Multiple Vitamin (MULTIVITAMIN) tablet Take 1 tablet by mouth daily.    [provider]  rosuvastatin  (CRESTOR ) 20 MG tablet Take 1 tablet (20 mg total) by mouth daily. 08/31/23 08/25/24  Elvira Hammersmith, MD  sitaGLIPtin  (JANUVIA ) 50 MG tablet Take 1 tablet (50 mg total) by mouth daily. 08/31/23 08/25/24  Elvira Hammersmith, MD  Spacer/Aero-Holding Chambers (AEROCHAMBER PLUS) inhaler Use as instructed 04/23/21   Sagardia, Miguel Jose, MD  zolpidem  (AMBIEN ) 5 MG tablet Take 1 tablet (5 mg total) by mouth at bedtime as needed for sleep. 11/29/23   Sagardia, Miguel Jose, MD      Allergies    Patient has no known allergies.    Review of Systems   Review of Systems  All other systems reviewed and are negative.   Physical Exam Updated Vital Signs BP 136/65 (BP Location: Left Arm)   Pulse 92   Temp 98 F (36.7 C) (Oral)   Resp 18   Ht 5\' 8"  (1.727 m)   Wt 73.5 kg   LMP 11/02/2010 (  Approximate) Comment: postmenopausal  SpO2 95%   BMI 24.64 kg/m  Physical Exam Vitals and nursing note reviewed.  Constitutional:      Appearance: Normal appearance.  HENT:     Head: Normocephalic and atraumatic.  Eyes:     General:        Right eye: No discharge.        Left eye: No discharge.     Conjunctiva/sclera: Conjunctivae normal.  Pulmonary:     Effort: Pulmonary effort is normal.  Musculoskeletal:     Comments: 2+ her Sal[place] pedis pulse felt bilaterally.  They are equal.  There is left hip tenderness.  Limited range of motion secondary to pain.  Skin:    General: Skin is warm and dry.     Findings:  No rash.  Neurological:     General: No focal deficit present.     Mental Status: She is alert.  Psychiatric:        Mood and Affect: Mood normal.        Behavior: Behavior normal.     ED Results / Procedures / Treatments   Labs (all labs ordered are listed, but only abnormal results are displayed) Labs Reviewed  BASIC METABOLIC PANEL WITH GFR - Abnormal; Notable for the following components:      Result Value   Glucose, Bld 200 (*)    All other components within normal limits  CBC WITH DIFFERENTIAL/PLATELET    EKG None  Radiology CT Hip Left Wo Contrast Result Date: 03/25/2024 CLINICAL DATA:  Status post trauma. EXAM: CT OF THE LEFT HIP WITHOUT CONTRAST TECHNIQUE: Multidetector CT imaging of the left hip was performed according to the standard protocol. Multiplanar CT image reconstructions were also generated. RADIATION DOSE REDUCTION: This exam was performed according to the departmental dose-optimization program which includes automated exposure control, adjustment of the mA and/or kV according to patient size and/or use of iterative reconstruction technique. COMPARISON:  None Available. FINDINGS: Bones/Joint/Cartilage Acute, nondisplaced fracture deformities are seen involving the medial aspect of the left superior pubic ramus and midportion of the left inferior pubic ramus. An additional acute, partially impacted fracture deformity is seen extending through the neck of the proximal left femur. There is no evidence of dislocation. Mild degenerative changes are seen in the form of joint space narrowing and acetabular sclerosis. Additional degenerative changes are seen within the visualized portion of the lower lumbar spine. Ligaments Suboptimally assessed by CT. Muscles and Tendons Limited in evaluation in the absence of intravenous contrast and otherwise unremarkable. Soft tissues Mild soft tissue thickening and very small hematoma formation is seen along the previously noted fracture  sites. IMPRESSION: 1. Acute, nondisplaced fracture deformities involving the left superior and inferior pubic rami. 2. Acute, partially impacted fracture deformity extending through the neck of the proximal left femur. Electronically Signed   By: Virgle Grime M.D.   On: 03/25/2024 23:31   DG Femur Min 2 Views Left Result Date: 03/25/2024 CLINICAL DATA:  Status post fall. EXAM: LEFT FEMUR 2 VIEWS COMPARISON:  None Available. FINDINGS: An acute fracture deformity is seen extending through the neck of the proximal left femur. There is no evidence of dislocation. A small suprapatellar effusion is seen within the visualized portion of the left knee. Soft tissues are otherwise unremarkable. IMPRESSION: Acute fracture of the proximal left femur. Electronically Signed   By: Virgle Grime M.D.   On: 03/25/2024 22:03   DG Hip Unilat W or Wo Pelvis 2-3 Views Left Result Date:  03/25/2024 CLINICAL DATA:  Status post fall. EXAM: DG HIP (WITH OR WITHOUT PELVIS) 2-3V LEFT COMPARISON:  None Available. FINDINGS: An acute, partially impacted fracture deformity is seen involving the neck of the proximal left femur. There is no evidence of dislocation. Mild degenerative changes are seen in the form of joint space narrowing and acetabular sclerosis. IMPRESSION: Acute fracture of the proximal left femur. Electronically Signed   By: Virgle Grime M.D.   On: 03/25/2024 21:57    Procedures Procedures    Medications Ordered in ED Medications  fentaNYL  (SUBLIMAZE ) injection 50 mcg (50 mcg Intravenous Given 03/25/24 2250)    ED Course/ Medical Decision Making/ A&P Clinical Course as of 03/26/24 0001  Sat Mar 25, 2024  2226 I spoke with Dr. Sulema Endo with orthopedics who recommends admission over to [facility] and he will perform surgery in the morning.  N.p.o. after midnight. [CF]  2329 Basic metabolic panel(!) Negative.  [CF]    Clinical Course User Index [CF] Darletta Ehrich, PA-C   {   Click here for ABCD2,  HEART and other calculators  Medical Decision Making Cheryl Hopkins is a 73 y.o. female patient who presents to the emergency department today for further evaluation of left hip pain.  Imaging was performed in triage interpreted by myself.  There is evidence of an impacted left femoral neck fracture.  I spoke with Dr. Sulema Endo with orthopedics who recommends her going over to [facility] he will repair this tomorrow.  I have gotten her admitted to the hospitalist service.  Basic labs were obtained.  Vital signs are normal.  Pain is controlled.  She is stable for admission at this time.   Amount and/or Complexity of Data Reviewed Labs: ordered. Decision-making details documented in ED Course. Radiology: ordered.  Risk Prescription drug management. Decision regarding hospitalization.    Final Clinical Impression(s) / ED Diagnoses Final diagnoses:  Closed fracture of neck of left femur, initial encounter [facility])    Rx / DC Orders ED Discharge Orders     None         Darletta Ehrich, PA-C 03/26/24 0004    Teddi Favors, DO 04/04/24 1555

## 2024-03-26 ENCOUNTER — Encounter (HOSPITAL_COMMUNITY): Payer: Self-pay | Admitting: Family Medicine

## 2024-03-26 ENCOUNTER — Inpatient Hospital Stay (HOSPITAL_COMMUNITY): Admitting: Anesthesiology

## 2024-03-26 ENCOUNTER — Inpatient Hospital Stay (HOSPITAL_COMMUNITY)

## 2024-03-26 ENCOUNTER — Encounter (HOSPITAL_COMMUNITY): Payer: Self-pay | Admitting: Anesthesiology

## 2024-03-26 ENCOUNTER — Encounter (HOSPITAL_COMMUNITY)
Admission: EM | Disposition: A | Discharge status: Home Health | Payer: Self-pay | Source: Home / Self Care | Attending: Internal Medicine

## 2024-03-26 ENCOUNTER — Other Ambulatory Visit: Payer: Self-pay

## 2024-03-26 DIAGNOSIS — S72002A Fracture of unspecified part of neck of left femur, initial encounter for closed fracture: Secondary | ICD-10-CM | POA: Diagnosis not present

## 2024-03-26 DIAGNOSIS — I10 Essential (primary) hypertension: Secondary | ICD-10-CM

## 2024-03-26 DIAGNOSIS — E119 Type 2 diabetes mellitus without complications: Secondary | ICD-10-CM

## 2024-03-26 DIAGNOSIS — Z7984 Long term (current) use of oral hypoglycemic drugs: Secondary | ICD-10-CM

## 2024-03-26 LAB — BASIC METABOLIC PANEL WITH GFR
Anion gap: 10 (ref 5–15)
BUN: 7 mg/dL — ABNORMAL LOW (ref 8–23)
CO2: 29 mmol/L (ref 22–32)
Calcium: 9.1 mg/dL (ref 8.9–10.3)
Chloride: 99 mmol/L (ref 98–111)
Creatinine, Ser: 0.56 mg/dL (ref 0.44–1.00)
GFR, Estimated: >60 mL/min (ref >60)
Glucose, Bld: 140 mg/dL — ABNORMAL HIGH (ref 70–99)
Potassium: 3.8 mmol/L (ref 3.5–5.1)
Sodium: 138 mmol/L (ref 135–145)

## 2024-03-26 LAB — GLUCOSE, CAPILLARY
Glucose-Capillary: 156 mg/dL — ABNORMAL HIGH (ref 70–99)
Glucose-Capillary: 162 mg/dL — ABNORMAL HIGH (ref 70–99)
Glucose-Capillary: 181 mg/dL — ABNORMAL HIGH (ref 70–99)
Glucose-Capillary: 202 mg/dL — ABNORMAL HIGH (ref 70–99)
Glucose-Capillary: 213 mg/dL — ABNORMAL HIGH (ref 70–99)
Glucose-Capillary: 290 mg/dL — ABNORMAL HIGH (ref 70–99)

## 2024-03-26 LAB — CBC
HCT: 41.9 % (ref 36.0–46.0)
Hemoglobin: 13.7 g/dL (ref 12.0–15.0)
MCH: 27.3 pg (ref 26.0–34.0)
MCHC: 32.7 g/dL (ref 30.0–36.0)
MCV: 83.6 fL (ref 80.0–100.0)
Platelets: 283 10*3/uL (ref 150–400)
RBC: 5.01 MIL/uL (ref 3.87–5.11)
RDW: 13.2 % (ref 11.5–15.5)
WBC: 14.1 10*3/uL — ABNORMAL HIGH (ref 4.0–10.5)
nRBC: 0 % (ref 0.0–0.2)

## 2024-03-26 LAB — CBC WITH DIFFERENTIAL/PLATELET
Abs Immature Granulocytes: 0.06 10*3/uL (ref 0.00–0.07)
Basophils Absolute: 0 10*3/uL (ref 0.0–0.1)
Basophils Relative: 0 %
Eosinophils Absolute: 0.1 10*3/uL (ref 0.0–0.5)
Eosinophils Relative: 1 %
HCT: 44.7 % (ref 36.0–46.0)
Hemoglobin: 14.6 g/dL (ref 12.0–15.0)
Immature Granulocytes: 1 %
Lymphocytes Relative: 9 %
Lymphs Abs: 1.1 10*3/uL (ref 0.7–4.0)
MCH: 28.1 pg (ref 26.0–34.0)
MCHC: 32.7 g/dL (ref 30.0–36.0)
MCV: 86 fL (ref 80.0–100.0)
Monocytes Absolute: 0.6 10*3/uL (ref 0.1–1.0)
Monocytes Relative: 5 %
Neutro Abs: 10.9 10*3/uL — ABNORMAL HIGH (ref 1.7–7.7)
Neutrophils Relative %: 84 %
Platelets: 303 10*3/uL (ref 150–400)
RBC: 5.2 MIL/uL — ABNORMAL HIGH (ref 3.87–5.11)
RDW: 13.2 % (ref 11.5–15.5)
WBC: 12.8 10*3/uL — ABNORMAL HIGH (ref 4.0–10.5)
nRBC: 0 % (ref 0.0–0.2)

## 2024-03-26 LAB — TYPE AND SCREEN
ABO/RH(D): O POS
Antibody Screen: NEGATIVE

## 2024-03-26 LAB — CBG MONITORING, ED: Glucose-Capillary: 164 mg/dL — ABNORMAL HIGH (ref 70–99)

## 2024-03-26 LAB — ABO/RH: ABO/RH(D): O POS

## 2024-03-26 LAB — SURGICAL PCR SCREEN
MRSA, PCR: NEGATIVE
Staphylococcus aureus: NEGATIVE

## 2024-03-26 SURGERY — OPEN REDUCTION INTERNAL FIXATION HIP
Anesthesia: General | Site: Hip | Laterality: Left

## 2024-03-26 MED ORDER — METHOCARBAMOL 500 MG PO TABS: 500.0000 mg | ORAL_TABLET | Freq: Four times a day (QID) | ORAL | Status: DC | PRN

## 2024-03-26 MED ORDER — CEFAZOLIN SODIUM-DEXTROSE 2-4 GM/100ML-% IV SOLN
INTRAVENOUS | Status: AC
  Filled 2024-03-26: qty 100

## 2024-03-26 MED ORDER — ONDANSETRON HCL 4 MG/2ML IJ SOLN
INTRAMUSCULAR | Status: DC | PRN
  Administered 2024-03-26: 4 mg via INTRAVENOUS

## 2024-03-26 MED ORDER — DEXAMETHASONE SODIUM PHOSPHATE 10 MG/ML IJ SOLN
INTRAMUSCULAR | Status: DC | PRN
  Administered 2024-03-26: 4 mg via INTRAVENOUS

## 2024-03-26 MED ORDER — HYDROMORPHONE HCL 1 MG/ML IJ SOLN: 0.5000 mg | INTRAMUSCULAR | Status: DC | PRN

## 2024-03-26 MED ORDER — 0.9 % SODIUM CHLORIDE (POUR BTL) OPTIME
TOPICAL | Status: DC | PRN
  Administered 2024-03-26: 1000 mL

## 2024-03-26 MED ORDER — MIDAZOLAM HCL 2 MG/2ML IJ SOLN
INTRAMUSCULAR | Status: DC | PRN
  Administered 2024-03-26: .5 mg via INTRAVENOUS

## 2024-03-26 MED ORDER — FENTANYL CITRATE (PF) 100 MCG/2ML IJ SOLN
INTRAMUSCULAR | Status: AC
  Filled 2024-03-26: qty 2

## 2024-03-26 MED ORDER — ASPIRIN 81 MG PO CHEW
81.0000 mg | CHEWABLE_TABLET | Freq: Two times a day (BID) | ORAL | Status: DC
  Administered 2024-03-26 – 2024-03-30 (×9): 81 mg via ORAL
  Filled 2024-03-26 (×9): qty 1

## 2024-03-26 MED ORDER — IPRATROPIUM-ALBUTEROL 0.5-2.5 (3) MG/3ML IN SOLN: 3.0000 mL | Freq: Four times a day (QID) | RESPIRATORY_TRACT | Status: DC | PRN

## 2024-03-26 MED ORDER — PROPOFOL 10 MG/ML IV BOLUS
INTRAVENOUS | Status: DC | PRN
  Administered 2024-03-26: 100 mg via INTRAVENOUS

## 2024-03-26 MED ORDER — TRANEXAMIC ACID-NACL 1000-0.7 MG/100ML-% IV SOLN
INTRAVENOUS | Status: AC
  Filled 2024-03-26: qty 100

## 2024-03-26 MED ORDER — ACETAMINOPHEN 10 MG/ML IV SOLN
INTRAVENOUS | Status: AC
  Filled 2024-03-26: qty 100

## 2024-03-26 MED ORDER — PHENYLEPHRINE 80 MCG/ML (10ML) SYRINGE FOR IV PUSH (FOR BLOOD PRESSURE SUPPORT)
PREFILLED_SYRINGE | INTRAVENOUS | Status: DC | PRN
Start: 2024-03-26 — End: 2024-03-26
  Administered 2024-03-26: 40 ug via INTRAVENOUS
  Administered 2024-03-26 (×3): 80 ug via INTRAVENOUS

## 2024-03-26 MED ORDER — TRANEXAMIC ACID-NACL 1000-0.7 MG/100ML-% IV SOLN
1000.0000 mg | INTRAVENOUS | Status: AC
  Administered 2024-03-26: 1000 mg via INTRAVENOUS

## 2024-03-26 MED ORDER — LIDOCAINE 2% (20 MG/ML) 5 ML SYRINGE
INTRAMUSCULAR | Status: DC | PRN
  Administered 2024-03-26: 80 mg via INTRAVENOUS

## 2024-03-26 MED ORDER — POLYETHYLENE GLYCOL 3350 17 G PO PACK: 17.0000 g | PACK | Freq: Every day | ORAL | Status: DC | PRN

## 2024-03-26 MED ORDER — ROCURONIUM BROMIDE 10 MG/ML (PF) SYRINGE
PREFILLED_SYRINGE | INTRAVENOUS | Status: DC | PRN
  Administered 2024-03-26: 60 mg via INTRAVENOUS

## 2024-03-26 MED ORDER — FENTANYL CITRATE (PF) 250 MCG/5ML IJ SOLN
INTRAMUSCULAR | Status: AC
Start: 2024-03-26 — End: ?
  Filled 2024-03-26: qty 5

## 2024-03-26 MED ORDER — INSULIN ASPART 100 UNIT/ML IJ SOLN
0.0000 [IU] | INTRAMUSCULAR | Status: DC
  Administered 2024-03-26: 3 [IU] via SUBCUTANEOUS
  Administered 2024-03-26: 1 [IU] via SUBCUTANEOUS
  Administered 2024-03-26 (×2): 2 [IU] via SUBCUTANEOUS
  Administered 2024-03-26: 1 [IU] via SUBCUTANEOUS
  Administered 2024-03-26: 2 [IU] via SUBCUTANEOUS
  Administered 2024-03-27: 3 [IU] via SUBCUTANEOUS
  Filled 2024-03-26: qty 0.06

## 2024-03-26 MED ORDER — PROCHLORPERAZINE EDISYLATE 10 MG/2ML IJ SOLN: 5.0000 mg | Freq: Four times a day (QID) | INTRAMUSCULAR | Status: DC | PRN

## 2024-03-26 MED ORDER — INSULIN ASPART 100 UNIT/ML IJ SOLN
0.0000 [IU] | INTRAMUSCULAR | Status: DC | PRN
  Administered 2024-03-26: 2 [IU] via SUBCUTANEOUS

## 2024-03-26 MED ORDER — FENTANYL CITRATE (PF) 250 MCG/5ML IJ SOLN
INTRAMUSCULAR | Status: DC | PRN
  Administered 2024-03-26: 25 ug via INTRAVENOUS
  Administered 2024-03-26: 50 ug via INTRAVENOUS
  Administered 2024-03-26: 25 ug via INTRAVENOUS

## 2024-03-26 MED ORDER — SUGAMMADEX SODIUM 200 MG/2ML IV SOLN: INTRAVENOUS | Status: DC | PRN

## 2024-03-26 MED ORDER — PROPOFOL 10 MG/ML IV BOLUS
INTRAVENOUS | Status: AC
Start: 2024-03-26 — End: ?
  Filled 2024-03-26: qty 20

## 2024-03-26 MED ORDER — OXYCODONE HCL 5 MG PO TABS: 5.0000 mg | ORAL_TABLET | ORAL | Status: DC | PRN

## 2024-03-26 MED ORDER — LACTATED RINGERS IV SOLN: INTRAVENOUS | Status: DC | PRN

## 2024-03-26 MED ORDER — ORAL CARE MOUTH RINSE: 15.0000 mL | Freq: Once | OROMUCOSAL | Status: AC

## 2024-03-26 MED ORDER — ACETAMINOPHEN 500 MG PO TABS
1000.0000 mg | ORAL_TABLET | Freq: Three times a day (TID) | ORAL | Status: DC
  Administered 2024-03-26 – 2024-03-30 (×9): 1000 mg via ORAL
  Filled 2024-03-26 (×10): qty 2

## 2024-03-26 MED ORDER — HYDROMORPHONE HCL 1 MG/ML IJ SOLN
0.5000 mg | INTRAMUSCULAR | Status: DC | PRN
  Administered 2024-03-26: 0.5 mg via INTRAVENOUS
  Filled 2024-03-26: qty 0.5

## 2024-03-26 MED ORDER — LACTATED RINGERS IV SOLN
INTRAVENOUS | Status: DC
Start: 2024-03-26 — End: 2024-03-26

## 2024-03-26 MED ORDER — CEFAZOLIN SODIUM-DEXTROSE 2-4 GM/100ML-% IV SOLN
2.0000 g | INTRAVENOUS | Status: AC
  Administered 2024-03-26: 2 g via INTRAVENOUS

## 2024-03-26 MED ORDER — METHOCARBAMOL 1000 MG/10ML IJ SOLN
500.0000 mg | Freq: Four times a day (QID) | INTRAMUSCULAR | Status: DC | PRN
  Administered 2024-03-26: 500 mg via INTRAVENOUS
  Filled 2024-03-26: qty 10

## 2024-03-26 MED ORDER — PHENYLEPHRINE HCL-NACL 20-0.9 MG/250ML-% IV SOLN
INTRAVENOUS | Status: DC | PRN
  Administered 2024-03-26: 30 ug/min via INTRAVENOUS

## 2024-03-26 MED ORDER — MIDAZOLAM HCL 2 MG/2ML IJ SOLN
INTRAMUSCULAR | Status: AC
  Filled 2024-03-26: qty 2

## 2024-03-26 MED ORDER — SUGAMMADEX SODIUM 200 MG/2ML IV SOLN
INTRAVENOUS | Status: DC | PRN
  Administered 2024-03-26: 200 mg via INTRAVENOUS

## 2024-03-26 MED ORDER — ROSUVASTATIN CALCIUM 20 MG PO TABS
20.0000 mg | ORAL_TABLET | Freq: Every day | ORAL | Status: DC
  Administered 2024-03-27 – 2024-03-30 (×4): 20 mg via ORAL
  Filled 2024-03-26 (×5): qty 1

## 2024-03-26 MED ORDER — OXYCODONE HCL 5 MG PO TABS
5.0000 mg | ORAL_TABLET | ORAL | Status: DC | PRN
  Administered 2024-03-26 – 2024-03-29 (×5): 5 mg via ORAL
  Filled 2024-03-26 (×5): qty 1

## 2024-03-26 MED ORDER — CHLORHEXIDINE GLUCONATE 0.12 % MT SOLN
15.0000 mL | Freq: Once | OROMUCOSAL | Status: AC
  Administered 2024-03-26: 15 mL via OROMUCOSAL

## 2024-03-26 MED ORDER — FENTANYL CITRATE (PF) 100 MCG/2ML IJ SOLN
25.0000 ug | INTRAMUSCULAR | Status: DC | PRN
  Administered 2024-03-26: 50 ug via INTRAVENOUS

## 2024-03-26 MED ORDER — AMLODIPINE BESYLATE 10 MG PO TABS
10.0000 mg | ORAL_TABLET | Freq: Every day | ORAL | Status: DC
  Administered 2024-03-27 – 2024-03-30 (×4): 10 mg via ORAL
  Filled 2024-03-26 (×5): qty 1

## 2024-03-26 MED ORDER — CHLORHEXIDINE GLUCONATE 0.12 % MT SOLN
OROMUCOSAL | Status: AC
  Filled 2024-03-26: qty 15

## 2024-03-26 MED ORDER — TRANEXAMIC ACID-NACL 1000-0.7 MG/100ML-% IV SOLN
1000.0000 mg | Freq: Once | INTRAVENOUS | Status: AC
  Administered 2024-03-26: 1000 mg via INTRAVENOUS
  Filled 2024-03-26: qty 100

## 2024-03-26 MED ORDER — CEFAZOLIN SODIUM-DEXTROSE 2-4 GM/100ML-% IV SOLN
2.0000 g | Freq: Four times a day (QID) | INTRAVENOUS | Status: AC
  Administered 2024-03-26 – 2024-03-27 (×3): 2 g via INTRAVENOUS
  Filled 2024-03-26 (×3): qty 100

## 2024-03-26 MED ORDER — ONDANSETRON HCL 4 MG/2ML IJ SOLN: 4.0000 mg | Freq: Once | INTRAMUSCULAR | Status: DC | PRN

## 2024-03-26 MED ORDER — ACETAMINOPHEN 10 MG/ML IV SOLN
INTRAVENOUS | Status: DC | PRN
  Administered 2024-03-26: 1000 mg via INTRAVENOUS

## 2024-03-26 SURGICAL SUPPLY — 32 items
BIT DRILL 3.2 QUICK MINI 300 (DRILL) IMPLANT
BIT DRILL 5.0 QC 6.5 (BIT) IMPLANT
BNDG COHESIVE 6X5 TAN NS LF (GAUZE/BANDAGES/DRESSINGS) ×1 IMPLANT
CANISTER SUCTION 3000ML PPV (SUCTIONS) ×1 IMPLANT
COVER PERINEAL POST (MISCELLANEOUS) ×1 IMPLANT
COVER SURGICAL LIGHT HANDLE (MISCELLANEOUS) ×1 IMPLANT
DRAPE C-ARM 42X72 X-RAY (DRAPES) ×1 IMPLANT
DRAPE C-ARMOR (DRAPES) IMPLANT
DRAPE STERI IOBAN 125X83 (DRAPES) ×1 IMPLANT
DRSG TEGADERM 4X4.75 (GAUZE/BANDAGES/DRESSINGS) IMPLANT
DRSG XEROFORM 1X8 (GAUZE/BANDAGES/DRESSINGS) IMPLANT
DURAPREP 26ML APPLICATOR (WOUND CARE) ×1 IMPLANT
ELECTRODE REM PT RTRN 9FT ADLT (ELECTROSURGICAL) ×1 IMPLANT
GAUZE PAD ABD 8X10 STRL (GAUZE/BANDAGES/DRESSINGS) ×1 IMPLANT
GAUZE SPONGE 4X4 12PLY STRL (GAUZE/BANDAGES/DRESSINGS) ×1 IMPLANT
GAUZE XEROFORM 1X8 LF (GAUZE/BANDAGES/DRESSINGS) ×1 IMPLANT
GLOVE BIOGEL PI IND STRL 8 (GLOVE) ×1 IMPLANT
GLOVE PI ORTHO PRO STRL 7.5 (GLOVE) ×1 IMPLANT
GOWN STRL REUS W/ TWL XL LVL3 (GOWN DISPOSABLE) ×1 IMPLANT
KIT BASIN OR (CUSTOM PROCEDURE TRAY) ×1 IMPLANT
KIT TURNOVER KIT B (KITS) ×1 IMPLANT
MANIFOLD NEPTUNE II (INSTRUMENTS) ×1 IMPLANT
NS IRRIG 1000ML POUR BTL (IV SOLUTION) ×1 IMPLANT
PACK GENERAL/GYN (CUSTOM PROCEDURE TRAY) ×1 IMPLANT
PAD ARMBOARD POSITIONER FOAM (MISCELLANEOUS) ×1 IMPLANT
SCREW CANN RVRS CUT FLT 90X16X (Screw) IMPLANT
SCREW CANN SD PT 6.5X95 (Screw) IMPLANT
STAPLER SKIN PROX 35W (STAPLE) IMPLANT
SUT VIC AB 0 CT1 18XCR BRD 8 (SUTURE) ×1 IMPLANT
SUT VIC AB 2-0 CT1 18 (SUTURE) ×1 IMPLANT
WASHER CANN 12.7 STRL (Washer) IMPLANT
WATER STERILE IRR 1000ML POUR (IV SOLUTION) ×1 IMPLANT

## 2024-03-26 NOTE — Anesthesia Postprocedure Evaluation (Signed)
 Anesthesia Post Note  Patient: Cheryl Hopkins  Procedure(s) Performed: OPEN REDUCTION INTERNAL FIXATION HIP (Left: Hip)     Patient location during evaluation: PACU Anesthesia Type: General Level of consciousness: awake and alert Pain management: pain level controlled Vital Signs Assessment: post-procedure vital signs reviewed and stable Respiratory status: spontaneous breathing, nonlabored ventilation and respiratory function stable Cardiovascular status: blood pressure returned to baseline and stable Postop Assessment: no apparent nausea or vomiting Anesthetic complications: no   No notable events documented.  Last Vitals:  Vitals:   03/26/24 1115 03/26/24 1136  BP: 127/60 125/64  Pulse: 90 95  Resp: 16 18  Temp:  36.6 C  SpO2: 93% 93%    Last Pain:  Vitals:   03/26/24 1455  TempSrc:   PainSc: 6                  Erin Havers

## 2024-03-26 NOTE — Progress Notes (Signed)
 Called Xray for ETA, they only have 3 techs. They are aware pt still needs xray and will be in her room on the floor.

## 2024-03-26 NOTE — Progress Notes (Signed)
 The patient declined both the required admission documentation questions and admission health assessment.The daughter states that the hospital already has the information and she does not need to give the information again.Patient through the daughter was made a ware of the importance of those questions and the importance of health assessment including skin.Charge nurse was also in the room and is a ware of the patients refusal.

## 2024-03-26 NOTE — Anesthesia Preprocedure Evaluation (Signed)
 Anesthesia Evaluation  Patient identified by MRN, date of birth, ID band Patient awake    Reviewed: Allergy & Precautions, NPO status , Patient's Chart, lab work & pertinent test results  Airway Mallampati: II  TM Distance: >3 FB Neck ROM: Full    Dental  (+) Teeth Intact, Dental Advisory Given   Pulmonary neg pulmonary ROS Chronic lung disease    Pulmonary exam normal breath sounds clear to auscultation       Cardiovascular hypertension, Pt. on medications Normal cardiovascular exam Rhythm:Regular Rate:Normal     Neuro/Psych negative neurological ROS     GI/Hepatic Neg liver ROS,GERD  ,,  Endo/Other  diabetes, Type 2, Oral Hypoglycemic Agents    Renal/GU negative Renal ROS     Musculoskeletal  (+) Arthritis ,  LEFT FEMORAL NECK FRACTURE   Abdominal   Peds  Hematology negative hematology ROS (+) Plt 283k   Anesthesia Other Findings Day of surgery medications reviewed with the patient.  Reproductive/Obstetrics                             Anesthesia Physical Anesthesia Plan  ASA: 3  Anesthesia Plan: General   Post-op Pain Management: Ofirmev  IV (intra-op)*   Induction: Intravenous  PONV Risk Score and Plan: 3 and Dexamethasone and Ondansetron  Airway Management Planned: Oral ETT  Additional Equipment:   Intra-op Plan:   Post-operative Plan: Extubation in OR  Informed Consent: I have reviewed the patients History and Physical, chart, labs and discussed the procedure including the risks, benefits and alternatives for the proposed anesthesia with the patient or authorized representative who has indicated his/her understanding and acceptance.     Dental advisory given and Interpreter used for interview  Plan Discussed with: CRNA  Anesthesia Plan Comments:        Anesthesia Quick Evaluation

## 2024-03-26 NOTE — Op Note (Signed)
 Orthopedic Surgery Operative Report   Procedure: Left nondisplaced femoral neck fracture screw fixation   Modifier: none   Date of procedure: 03/26/2024   Patient name: Cheryl Hopkins   MRN: [id number]  DOB: [birth year]   Surgeon: Colette Davies, MD Assistant: None Pre-operative diagnosis: left nondisplaced femoral neck fracture Post-operative diagnosis: same as above Findings: nondisplaced left femoral neck fracture   Specimens: none Anesthesia: general EBL: 150cc Complications: none Pre-incision antibiotic: ancef TXA was given prior to incision as well   Implants:  Implant Name Type Inv. Item Serial No. Manufacturer Lot No. LRB No. Used Action  WASHER CANN 12.7 STRL - [id number] Washer WASHER CANN 12.7 [facility] [employer] [id number] Left 1 Implanted  WASHER CANN 12.7 STRL - [id number] Washer WASHER CANN 12.7 [employer] [id number] Left 2 Implanted  SCREW CANN SD PT 6.5X95 - [id number] Screw SCREW CANN SD PT 6.5X95  [employer]  Left 2 Implanted  SCREW CANN RVRS CUT FLT 90X16X - [id number] Screw SCREW CANN RVRS CUT FLT [id number]  [employer]  Left 1 Implanted       Indication for procedure: Patient is a 73 y.o. female who presented to the ER after a ground level fall at her gym. The patient had left hip pain and x-rays revealed a nondisplaced left femoral neck fracture. The patient was admitted to a medicine service with orthopedics consulted. I met the patient and discussed the fracture. I recommended operative management in the form of screw fixation to stabilize the fracture and allow for mobilization. Explained the risks of this procedure included, but were not limited to: nonunion, malunion, fixation failure, infection, bleeding, stiffness, need for additional procedures, avascular necrosis, deep vein thrombosis, pulmonary embolism, MI, arrhythmia, and death. The alternatives of this surgery would be to treat the  fracture with immobilization or to perform no intervention. After our discussion, patient and her family elected to proceed with surgery.    Procedure Description: The patient was met in the pre-operative holding area. The patient's identity and consent were verified. The operative site was marked by myself. The patient's remaining questions about the surgery were answered. The patient was brought back to the operating room. General anesthesia was induced and an endotracheal tube was placed by the anesthesia staff. The patient was transferred to the [facility] table. All bony prominences were well padded. The surgical area was cleansed with alcohol. Ancef and TXA were administered by anesthesia. The patient's skin was then prepped and draped in a standard, sterile fashion. A time out was performed that identified the patient, the procedure, and the operative site. All team members agreed with what was stated in the time out.    Fluoroscopy was brought in to mark the trajectory for screw placement on both the AP and lateral planes. A 5cm incision was then made at the intersection of the marks made on the AP and lateral films. This incision was taken sharply down through skin, dermis, and fascia. A pin was then placed over the lateral cortex of the proximal femur. Adjustments were made on both the AP and lateral fluoroscopic images to have the pin go into the inferior portion of the femoral neck while contained with the femoral neck. Once this position was obtained, the pin was advanced into the femoral neck and femoral head under fluoroscopic guidance. Pin placement was checked on both AP and lateral fluoroscopic images and shown to be in satisfactory position. A targeting guide was  then placed over the initial pin. A second pin was placed into this guide to locate it in the superior and anterior aspect of the femoral neck. The pin was advanced into the femoral head and neck under fluoroscopic guidance. AP and lateral  fluoroscopic images confirmed satisfactory position. The same process was then repeated to place a third pin in the posterior and superior aspect of the femoral neck.   Next, attention was turned to screw placement. The screw length was estimated off of the pin. That length partially threaded screw with a washer was then inserted over the pin until there was good purchase on the lateral cortex of the proximal femur. Satisfactory placement was confirmed on AP and lateral fluoroscopic images. The same process was then repeated two more times to place a total of three partially threaded cannulated screws with washers.    Final AP and lateral fluoroscopic images were then taken of the hip and showed satisfactory position of all three screws within the femoral neck. These images also demonstrated that the fracture was still nondisplaced. The wound was copiously irrigated with sterile saline. The fascia was closed with 0 vicryl. The deep dermal layer was closed with 2-0 vicryl. The skin was closed with staples. Dressings were applied. All counts were correct at the end of the case. Patient was transferred back to a hospital bed. The patient was awakened from anesthesia and brought back to the post-anesthesia care unit in stable condition.     Post-operative plan: The patient will recover in the post-anesthesia care unit and then go to the floor on the medicine service. The patient will receive two post-operative doses of ancef. She will get another dose of TXA as well. The patient will be weight bearing as tolerated. The patient will work with physical therapy. The patient's disposition will be determined by the medicine service.        Colette Davies, MD Orthopedic Surgeon

## 2024-03-26 NOTE — H&P (Addendum)
 History and Physical    Cheryl Hopkins ZOX:[id number] DOB: 05/28/1951 DOA: 03/25/2024  PCP: Elvira Hammersmith, MD   Patient coming from: Home   Chief Complaint: Fall with severe left hip pain   HPI: Cheryl Hopkins is a 73 y.o. female with medical history significant for hypertension, hyperlipidemia, type 2 diabetes mellitus, and bronchiectasis who presents with severe left hip pain after fall.  Patient reports that she was in her usual state of health and having an uneventful day when she was attempting to sit on a bench at the gym.  She ended up falling onto her left side, denies hitting her head or losing consciousness, but has had severe left hip pain ever since.  She is typically physically active and never experiences chest discomfort with activity.  She denies any history of heart disease.  She has not had any recent cough, shortness of breath, fever, or chills.  ED Course: Upon arrival to the ED, patient is found to be afebrile and saturating well on room air with normal HR and stable BP.  Labs are notable for glucose 200 and normal renal function.  CBC not yet resulted.  CT demonstrates left femoral neck fracture and acute nondisplaced fractures of the left superior and inferior pubic rami.  Orthopedic surgery (Dr. Sulema Endo) was consulted by the ED PA and recommends medical admission to [facility].  Patient was treated with fentanyl in the ED.   Review of Systems:  All other systems reviewed and apart from HPI, are negative.  Past Medical History:  Diagnosis Date   Arthritis    knee   Bronchitis    Diabetes mellitus without complication (HCC)    Several years.   Hyperlipidemia 09/30/2015   Hypertension    Stomach pain    with diarrhea    Past Surgical History:  Procedure Laterality Date   COLONOSCOPY      Social History:   reports that she has never smoked. She has never used smokeless tobacco. She reports that she does not drink alcohol and does not use  drugs.  No Known Allergies  Family History  Problem Relation Age of Onset   Bronchitis Sister    Hypertension Sister    Stroke Sister    Hepatitis Daughter    Colon cancer Neg Hx    Esophageal cancer Neg Hx    Rectal cancer Neg Hx    Stomach cancer Neg Hx      Prior to Admission medications   Medication Sig Start Date End Date Taking? Authorizing Provider  ACCU-CHEK GUIDE test strip USE 1 STRIP ONCE DAILY 02/17/23   Elvira Hammersmith, MD  Accu-Chek Softclix Lancets lancets SMARTSIG:1 Topical Daily 02/17/23   Sagardia, Miguel Jose, MD  amLODipine  (NORVASC ) 10 MG tablet Take 1 tablet (10 mg total) by mouth daily. 08/31/23 08/25/24  Elvira Hammersmith, MD  Blood Glucose Monitoring Suppl w/Device KIT 1 Device by Does not apply route daily. 02/17/23   Sagardia, Miguel Jose, MD  budesonide -formoterol  (SYMBICORT ) 80-4.5 MCG/ACT inhaler Inhale 2 puffs into the lungs 2 (two) times daily. 06/25/22   Elvira Hammersmith, MD  dapagliflozin  propanediol (FARXIGA ) 10 MG TABS tablet Take 1 tablet (10 mg total) by mouth daily before breakfast. 11/29/23   Elvira Hammersmith, MD  gabapentin  (NEURONTIN ) 300 MG capsule Take 1 capsule by mouth at bedtime Patient not taking: Reported on 11/29/2023 05/29/23   Sagardia, Miguel Jose, MD  glimepiride  (AMARYL ) 2 MG tablet Take 1 tablet (2 mg total) by  mouth daily with breakfast. TAKE ONE TABLET BY MOUTH WITH BREAKFAST 08/31/23 08/25/24  Elvira Hammersmith, MD  meloxicam  (MOBIC ) 15 MG tablet Take 1 tablet by mouth once daily 12/29/23   Sagardia, Miguel Jose, MD  metFORMIN  (GLUCOPHAGE -XR) 500 MG 24 hr tablet Take 1 tablet (500 mg total) by mouth 2 (two) times daily with a meal. 08/31/23 08/25/24  Elvira Hammersmith, MD  Multiple Vitamin (MULTIVITAMIN) tablet Take 1 tablet by mouth daily.    [provider]  rosuvastatin  (CRESTOR ) 20 MG tablet Take 1 tablet (20 mg total) by mouth daily. 08/31/23 08/25/24  Elvira Hammersmith, MD  sitaGLIPtin   (JANUVIA ) 50 MG tablet Take 1 tablet (50 mg total) by mouth daily. 08/31/23 08/25/24  Elvira Hammersmith, MD  Spacer/Aero-Holding Chambers (AEROCHAMBER PLUS) inhaler Use as instructed 04/23/21   Elvira Hammersmith, MD  zolpidem  (AMBIEN ) 5 MG tablet Take 1 tablet (5 mg total) by mouth at bedtime as needed for sleep. 11/29/23   Elvira Hammersmith, MD    Physical Exam: Vitals:   03/25/24 2115 03/25/24 2121  BP:  136/65  Pulse:  92  Resp:  18  Temp:  98 F (36.7 C)  TempSrc:  Oral  SpO2:  95%  Weight: 73.5 kg   Height: 5\' 8"  (1.727 m)     Constitutional: NAD, no pallor or diaphoresis   Eyes: PERTLA, lids and conjunctivae normal ENMT: Mucous membranes are moist. Posterior pharynx clear of any exudate or lesions.   Neck: supple, no masses  Respiratory: no wheezing, no crackles. No accessory muscle use.  Cardiovascular: S1 & S2 heard, regular rate and rhythm. No extremity edema.  Abdomen: No tenderness, soft. Bowel sounds active.  Musculoskeletal: no clubbing / cyanosis. Left hip tenderness, neurovascularly intact distally.   Skin: no significant rashes, lesions, ulcers. Warm, dry, well-perfused. Neurologic: CN 2-12 grossly intact. Moving all extremities. Alert and oriented.  Psychiatric: Calm. Cooperative.    Labs and Imaging on Admission: I have personally reviewed following labs and imaging studies  CBC: No results for input(s): "WBC", "NEUTROABS", "HGB", "HCT", "MCV", "PLT" in the last 168 hours. Basic Metabolic Panel: Recent Labs  Lab 03/25/24 2211  NA 136  K 3.7  CL 99  CO2 27  GLUCOSE 200*  BUN 12  CREATININE 0.83  CALCIUM  9.1   GFR: Estimated Creatinine Clearance: 62.7 mL/min (by C-G formula based on SCr of 0.83 mg/dL). Liver Function Tests: No results for input(s): "AST", "ALT", "ALKPHOS", "BILITOT", "PROT", "ALBUMIN" in the last 168 hours. No results for input(s): "LIPASE", "AMYLASE" in the last 168 hours. No results for input(s): "AMMONIA" in the last  168 hours. Coagulation Profile: No results for input(s): "INR", "PROTIME" in the last 168 hours. Cardiac Enzymes: No results for input(s): "CKTOTAL", "CKMB", "CKMBINDEX", "TROPONINI" in the last 168 hours. BNP (last 3 results) No results for input(s): "PROBNP" in the last 8760 hours. HbA1C: No results for input(s): "HGBA1C" in the last 72 hours. CBG: No results for input(s): "GLUCAP" in the last 168 hours. Lipid Profile: No results for input(s): "CHOL", "HDL", "LDLCALC", "TRIG", "CHOLHDL", "LDLDIRECT" in the last 72 hours. Thyroid Function Tests: No results for input(s): "TSH", "T4TOTAL", "FREET4", "T3FREE", "THYROIDAB" in the last 72 hours. Anemia Panel: No results for input(s): "VITAMINB12", "FOLATE", "FERRITIN", "TIBC", "IRON", "RETICCTPCT" in the last 72 hours. Urine analysis:    Component Value Date/Time   COLORURINE STRAW (A) 11/11/2023 1920   APPEARANCEUR CLEAR 11/11/2023 1920   LABSPEC 1.006 11/11/2023 1920   PHURINE 6.0 11/11/2023  1920   GLUCOSEU NEGATIVE 11/11/2023 1920   HGBUR NEGATIVE 11/11/2023 1920   BILIRUBINUR NEGATIVE 11/11/2023 1920   KETONESUR NEGATIVE 11/11/2023 1920   PROTEINUR NEGATIVE 11/11/2023 1920   NITRITE NEGATIVE 11/11/2023 1920   LEUKOCYTESUR NEGATIVE 11/11/2023 1920   Sepsis Labs: @LABRCNTIP (procalcitonin:4,lacticidven:4) )No results found for this or any previous visit (from the past 240 hours).   Radiological Exams on Admission: CT Hip Left Wo Contrast Result Date: 03/25/2024 CLINICAL DATA:  Status post trauma. EXAM: CT OF THE LEFT HIP WITHOUT CONTRAST TECHNIQUE: Multidetector CT imaging of the left hip was performed according to the standard protocol. Multiplanar CT image reconstructions were also generated. RADIATION DOSE REDUCTION: This exam was performed according to the departmental dose-optimization program which includes automated exposure control, adjustment of the mA and/or kV according to patient size and/or use of iterative  reconstruction technique. COMPARISON:  None Available. FINDINGS: Bones/Joint/Cartilage Acute, nondisplaced fracture deformities are seen involving the medial aspect of the left superior pubic ramus and midportion of the left inferior pubic ramus. An additional acute, partially impacted fracture deformity is seen extending through the neck of the proximal left femur. There is no evidence of dislocation. Mild degenerative changes are seen in the form of joint space narrowing and acetabular sclerosis. Additional degenerative changes are seen within the visualized portion of the lower lumbar spine. Ligaments Suboptimally assessed by CT. Muscles and Tendons Limited in evaluation in the absence of intravenous contrast and otherwise unremarkable. Soft tissues Mild soft tissue thickening and very small hematoma formation is seen along the previously noted fracture sites. IMPRESSION: 1. Acute, nondisplaced fracture deformities involving the left superior and inferior pubic rami. 2. Acute, partially impacted fracture deformity extending through the neck of the proximal left femur. Electronically Signed   By: Virgle Grime M.D.   On: 03/25/2024 23:31   DG Femur Min 2 Views Left Result Date: 03/25/2024 CLINICAL DATA:  Status post fall. EXAM: LEFT FEMUR 2 VIEWS COMPARISON:  None Available. FINDINGS: An acute fracture deformity is seen extending through the neck of the proximal left femur. There is no evidence of dislocation. A small suprapatellar effusion is seen within the visualized portion of the left knee. Soft tissues are otherwise unremarkable. IMPRESSION: Acute fracture of the proximal left femur. Electronically Signed   By: Virgle Grime M.D.   On: 03/25/2024 22:03   DG Hip Unilat W or Wo Pelvis 2-3 Views Left Result Date: 03/25/2024 CLINICAL DATA:  Status post fall. EXAM: DG HIP (WITH OR WITHOUT PELVIS) 2-3V LEFT COMPARISON:  None Available. FINDINGS: An acute, partially impacted fracture deformity is seen  involving the neck of the proximal left femur. There is no evidence of dislocation. Mild degenerative changes are seen in the form of joint space narrowing and acetabular sclerosis. IMPRESSION: Acute fracture of the proximal left femur. Electronically Signed   By: Virgle Grime M.D.   On: 03/25/2024 21:57    Assessment/Plan  1. Left hip fracture  - Based on the available data, Chezney Huether presents an estimated 0.4% risk of perioperative MI or cardiac arrest; no further preoperative cardiac evaluation is indicated  - Continue NPO, pain-control, and supportive care   2. Type II DM  - A1c was 8.2% in January 2025  - Hold oral agents, check CBGs, and use low-intensity SSI for now   3. Hypertension  - Continue Norvasc  as tolerated    4. Hyperlipidemia  - Continue Crestor     5. Bronchiectasis  - Not in exacerbation  - Supportive care  DVT prophylaxis: SCDs  Code Status: Full  Level of Care: Level of care: Med-Surg Family Communication: Family at bedside  Disposition Plan:  Patient is from: Home  Anticipated d/c is to: TBD Anticipated d/c date is: 03/30/24  Patient currently: Pending orthopedic surgery consultation and likely operative hip repair  Consults called: Orthopedic surgery  Admission status: inpatient     Walton Guppy, MD [facility]  03/26/2024, 12:28 AM

## 2024-03-26 NOTE — Plan of Care (Signed)
  Problem: Coping: Goal: Ability to adjust to condition or change in health will improve Outcome: Progressing   Problem: Education: Goal: Knowledge of General Education information will improve Description: Including pain rating scale, medication(s)/side effects and non-pharmacologic comfort measures Outcome: Progressing   Problem: Activity: Goal: Risk for activity intolerance will decrease Outcome: Progressing   Problem: Nutrition: Goal: Adequate nutrition will be maintained Outcome: Progressing   Problem: Elimination: Goal: Will not experience complications related to bowel motility Outcome: Progressing   Problem: Pain Managment: Goal: General experience of comfort will improve and/or be controlled Outcome: Progressing   Problem: Elimination: Goal: Will not experience complications related to urinary retention Outcome: Progressing

## 2024-03-26 NOTE — ED Notes (Signed)
[facility] transport setup for pt

## 2024-03-26 NOTE — Progress Notes (Signed)
 There was not an available interpreter for Patient's native language on the language services. Patient's son required to translate for the patient and graciously agreed to do so. Aaron Aas

## 2024-03-26 NOTE — Anesthesia Procedure Notes (Signed)
 Procedure Name: Intubation Date/Time: 03/26/2024 9:07 AM  Performed by: Pauletta Boroughs, CRNAPre-anesthesia Checklist: Patient identified, Emergency Drugs available, Suction available and Patient being monitored Patient Re-evaluated:Patient Re-evaluated prior to induction Oxygen Delivery Method: Circle System Utilized Preoxygenation: Pre-oxygenation with 100% oxygen Induction Type: IV induction and Cricoid Pressure applied Ventilation: Mask ventilation without difficulty Laryngoscope Size: Mac and 3 Grade View: Grade II Tube type: Oral Tube size: 7.0 mm Number of attempts: 1 Airway Equipment and Method: Stylet and Oral airway Placement Confirmation: ETT inserted through vocal cords under direct vision, positive ETCO2 and breath sounds checked- equal and bilateral Tube secured with: Tape Dental Injury: Teeth and Oropharynx as per pre-operative assessment

## 2024-03-26 NOTE — Brief Op Note (Signed)
 03/26/2024  10:27 AM  PATIENT:  Cheryl Hopkins  73 y.o. female  PRE-OPERATIVE DIAGNOSIS:  LEFT FEMORAL NECK FRACTURE  POST-OPERATIVE DIAGNOSIS:  LEFT FEMORAL NECK FRACTURE  PROCEDURE:  Procedure(s) with comments: OPEN REDUCTION INTERNAL FIXATION HIP (Left) - HIP PINNING  SURGEON:  Surgeons and Role:    * Diedra Fowler, MD - Primary  PHYSICIAN ASSISTANT: none  ASSISTANTS: none   ANESTHESIA:   general  EBL:  150 mL   BLOOD ADMINISTERED:none  DRAINS: none   LOCAL MEDICATIONS USED:  NONE  SPECIMEN:  No Specimen  DISPOSITION OF SPECIMEN:  N/A  COUNTS:  YES  TOURNIQUET: NONE  DICTATION: .Note written in EPIC  PLAN OF CARE: Admit to inpatient   PATIENT DISPOSITION:  PACU - hemodynamically stable.   Delay start of Pharmacological VTE agent (>24hrs) due to surgical blood loss or risk of bleeding: no

## 2024-03-26 NOTE — Discharge Instructions (Addendum)
 Orthopedic Surgery Discharge Instructions  Patient name: Cheryl Hopkins Fracture: left femoral neck fracture Procedure Performed: left femoral neck fracture screw fixation Date of Surgery: 03/26/2024 Surgeon: Colette Davies, MD  Activity: You are allowed to put as much weight on your leg as you would like. You can walk as much as you would like. You can perform household activities such as cleaning dishes, doing laundry, vacuuming, etc.  Incision Care: Your incision site has a dressing over it. That dressing should remain in place and dry at all times for a total of one week after surgery. After one week, you can remove the dressing. Underneath the dressing, you will find skin staples. You should leave these staples in place. They will be taken out in the office when the wound has healed. Do not pick, rub, or scrub at them. Do not put cream or lotion over the surgical area. After one week and once the dressing is off, it is okay to let soap and water run over your incision. Again, do not pick, scrub, or rub at the staples when bathing. Do not submerge (e.g., take a bath, swim, go in a hot tub, etc.) until six weeks after surgery. There may be some bloody drainage from the incision into the dressing after surgery. This is normal. You do not need to replace the dressing. Continue to leave it in place for the one week as instructed above. Should the dressing become saturated with blood or drainage, please call the office for further instructions.   Medications: You have been prescribed oxycodone. This is a narcotic pain medication and should only be taken as prescribed. You should not drink alcohol or operate heavy machinery (including driving) while taking this medication. The oxycodone can cause constipation as a side effect. For that reason, you have been prescribed senna and miralax. These are both laxatives. You do not need to take this medication if you develop diarrhea. Should you remain constipated even  while taking the senna and miralax, please use the miralax twice daily. Tylenol  has been prescribed to be taken every 8 hours, which will give you additional pain relief.   You have been prescribed aspirin as a blood thinner. This medication is to be taken to prevent blood clots. Take 81 milligrams twice daily. You should refrain from using other blood thinners (warfarin, apixaban, plavix, xarelto, etc.) while using the aspirin. You will need to take this medication for a total of 6 weeks after your surgery.   You should not use over-the-counter NSAIDs (ibuprofen, Aleve , Celebrex, naproxen , meloxicam , etc.) for pain relief because aspirin is a similar medication. There can be side effects including but not limited to kidney injury and ulcers if you take these type of medications with the aspirin.  In order to set expectations for opioid prescriptions, you will only be prescribed opioids for a total of six weeks after surgery and, at two-weeks after surgery, your opioid prescription will start to tapered (decreased dosage and number of pills). If you have ongoing need for opioid medication six weeks after surgery, you will be referred to pain management. If you are already established with a provider that is giving you opioid medications, you should schedule an appointment with them for six weeks after surgery if you feel you are going to need another prescription. State law only allows for opioid prescriptions one week at a time. If you are running out of opioid medication near the end of the week, please call the office during business hours  before running out so I can send you another prescription.   Driving: You should not drive while taking narcotic pain medications. You should start getting back to driving slowly and you may want to try driving in a parking lot before doing anything more.   Diet: You are safe to resume your regular diet after surgery.   Reasons to Call the Office After Surgery: You  should feel free to call the office with any concerns or questions you have in the post-operative period, but you should definitely notify the office if you develop: -shortness of breath, chest pain, or trouble breathing -excessive bleeding, drainage, redness, or swelling around the surgical site -fevers, chills, or pain that is getting worse with each passing day -persistent nausea or vomiting -new weakness in the left leg, new or worsening numbness or tingling in the left leg -other concerns about your surgery  Follow Up Appointments: You have a follow up visit scheduled with Dr. Sulema Endo on 04/13/2024 at 2pm. Please arrive on time to this appointment. The office location and phone number are listed below.   Office Information:  -Colette Davies, MD -Phone number: [phone] -Address: [address]       [place], [place] [address]

## 2024-03-26 NOTE — Progress Notes (Signed)
 Dr. Sulema Endo was called at this time and is aware of hematoma to pt's incision site. He was informed hematoma had not changed in PACU.

## 2024-03-26 NOTE — Transfer of Care (Signed)
 Immediate Anesthesia Transfer of Care Note  Patient: Cheryl Hopkins  Procedure(s) Performed: OPEN REDUCTION INTERNAL FIXATION HIP (Left: Hip)  Patient Location: PACU  Anesthesia Type:General  Level of Consciousness: awake, alert , and oriented  Airway & Oxygen Therapy: Patient Spontanous Breathing and Patient connected to face mask oxygen  Post-op Assessment: Report given to RN and Post -op Vital signs reviewed and stable  Post vital signs: Reviewed and stable  Last Vitals:  Vitals Value Taken Time  BP 153/53 03/26/24 1019  Temp    Pulse 97 03/26/24 1022  Resp 19 03/26/24 1022  SpO2 99 % 03/26/24 1022  Vitals shown include unfiled device data.  Last Pain:  Vitals:   03/26/24 0213  TempSrc:   PainSc: 2          Complications: No notable events documented.

## 2024-03-26 NOTE — Progress Notes (Signed)
 [facility] PROGRESS NOTE   Cheryl Hopkins ZOX:[id number] DOB: 09/14/1951 DOA: 03/25/2024  PCP: Elvira Hammersmith, MD  Brief History: 73 y.o. female with medical history significant for hypertension, hyperlipidemia, type 2 diabetes mellitus, and bronchiectasis who presents with severe left hip pain after fall.  Patient was found to have a left hip fracture.  She was hospitalized for further management.  Consultants: Orthopedics  Procedures: Surgery is planned for today    Subjective/Interval History: Patient does not speak [place].  Her daughter and son are at the bedside who are able to interpret.  Currently has significant pain at 7-8 out of 10 in intensity in the left hip area.  Denies any chest pain shortness of breath.  No nausea vomiting.  Has chronic cough.    Assessment/Plan:  Left hip fracture Secondary to mechanical fall. Plan is for surgical intervention later today.  Pain control.  DVT prophylaxis after surgery.  Will also need PT and OT evaluation tomorrow.  Diabetes mellitus type 2 HbA1c was 8.2 in January. Oral agents on hold.  SSI.  Essential hypertension Amlodipine  being continued.  Hyperlipidemia On statin.  Leukocytosis Reactive.  Recheck labs tomorrow morning.  History of bronchiectasis Has been seen by pulmonology previously.  Respiratory status is stable.  Chest x-ray shows chronic changes without any acute findings.   DVT Prophylaxis: Definitive prophylaxis after surgery Code Status: Full code Family Communication: Discussed with patient's daughter and son Disposition Plan: To be determined  Status is: Inpatient Remains inpatient appropriate because: Hip fracture      Medications: Scheduled:  [MAR Hold] amLODipine   10 mg Oral Daily   [MAR Hold] insulin aspart  0-6 Units Subcutaneous Q4H   [MAR Hold] rosuvastatin   20 mg Oral Daily   Continuous:  [MAR Hold]  ceFAZolin (ANCEF) IV     lactated ringers 10 mL/hr at 03/26/24  0841   [facility] Hold] tranexamic acid     PRN:0.9 % irrigation (POUR BTL), fentaNYL (SUBLIMAZE) injection, [MAR Hold]  HYDROmorphone (DILAUDID) injection, insulin aspart, [MAR Hold] ipratropium-albuterol , [MAR Hold] methocarbamol **OR** [MAR Hold] methocarbamol (ROBAXIN) injection, ondansetron (ZOFRAN) IV, [MAR Hold] oxyCODONE, [MAR Hold] polyethylene glycol, [MAR Hold] prochlorperazine  Antibiotics: Anti-infectives (From admission, onward)    Start     Dose/Rate Route Frequency Ordered Stop   03/26/24 0800  [MAR Hold]  ceFAZolin (ANCEF) IVPB 2g/100 mL premix        (MAR Hold since Sun 03/26/2024 at 0829.Hold Reason: Transfer to a Procedural area)   2 g 200 mL/hr over 30 Minutes Intravenous To Surgery 03/26/24 0709 03/27/24 0800       Objective:  Vital Signs  Vitals:   03/25/24 2121 03/26/24 0132 03/26/24 0359 03/26/24 0757  BP: 136/65 (!) 141/56 139/67 (!) 143/60  Pulse: 92 (!) 102 100 (!) 109  Resp: 18 20 16 20   Temp: 98 F (36.7 C) 98.7 F (37.1 C) 98.8 F (37.1 C) 98.3 F (36.8 C)  TempSrc: Oral Oral    SpO2: 95% 94% 94% 94%  Weight:      Height:        Intake/Output Summary (Last 24 hours) at 03/26/2024 0915 Last data filed at 03/26/2024 0755 Gross per 24 hour  Intake 0 ml  Output --  Net 0 ml   Filed Weights   03/25/24 2115  Weight: 73.5 kg    General appearance: Awake alert.  In no distress Resp: Normal effort at rest.  Few crackles at the bases.  No wheezing or rhonchi. Cardio: S1-S2  is normal regular.  No S3-S4.  No rubs murmurs or bruit GI: Abdomen is soft.  Nontender nondistended.  Bowel sounds are present normal.  No masses organomegaly Extremities: Left lower extremity is externally rotated.   Lab Results:  Data Reviewed: I have personally reviewed following labs and reports of the imaging studies  CBC: Recent Labs  Lab 03/25/24 2211 03/26/24 0525  WBC 12.8* 14.1*  NEUTROABS 10.9*  --   HGB 14.6 13.7  HCT 44.7 41.9  MCV 86.0 83.6  PLT 303 283     Basic Metabolic Panel: Recent Labs  Lab 03/25/24 2211 03/26/24 0525  NA 136 138  K 3.7 3.8  CL 99 99  CO2 27 29  GLUCOSE 200* 140*  BUN 12 7*  CREATININE 0.83 0.56  CALCIUM  9.1 9.1    GFR: Estimated Creatinine Clearance: 65.1 mL/min (by C-G formula based on SCr of 0.56 mg/dL).    CBG: Recent Labs  Lab 03/26/24 0033 03/26/24 0358 03/26/24 0845  GLUCAP 164* 162* 181*     Radiology Studies: Chest Portable 1 View Result Date: 03/26/2024 CLINICAL DATA:  Preop for hip fracture EXAM: PORTABLE CHEST 1 VIEW COMPARISON:  Radiograph 11/29/2023 FINDINGS: Stable cardiomediastinal silhouette. Aortic atherosclerotic calcification. Chronic bronchitic changes. Left basilar atelectasis/scarring. Otherwise no focal consolidation, pleural effusion, or pneumothorax. No displaced rib fractures. IMPRESSION: Chronic lung changes similar to 11/29/2023. No acute cardiopulmonary disease. Electronically Signed   By: Rozell Cornet M.D.   On: 03/26/2024 01:04   CT Hip Left Wo Contrast Result Date: 03/25/2024 CLINICAL DATA:  Status post trauma. EXAM: CT OF THE LEFT HIP WITHOUT CONTRAST TECHNIQUE: Multidetector CT imaging of the left hip was performed according to the standard protocol. Multiplanar CT image reconstructions were also generated. RADIATION DOSE REDUCTION: This exam was performed according to the departmental dose-optimization program which includes automated exposure control, adjustment of the mA and/or kV according to patient size and/or use of iterative reconstruction technique. COMPARISON:  None Available. FINDINGS: Bones/Joint/Cartilage Acute, nondisplaced fracture deformities are seen involving the medial aspect of the left superior pubic ramus and midportion of the left inferior pubic ramus. An additional acute, partially impacted fracture deformity is seen extending through the neck of the proximal left femur. There is no evidence of dislocation. Mild degenerative changes are seen in  the form of joint space narrowing and acetabular sclerosis. Additional degenerative changes are seen within the visualized portion of the lower lumbar spine. Ligaments Suboptimally assessed by CT. Muscles and Tendons Limited in evaluation in the absence of intravenous contrast and otherwise unremarkable. Soft tissues Mild soft tissue thickening and very small hematoma formation is seen along the previously noted fracture sites. IMPRESSION: 1. Acute, nondisplaced fracture deformities involving the left superior and inferior pubic rami. 2. Acute, partially impacted fracture deformity extending through the neck of the proximal left femur. Electronically Signed   By: Virgle Grime M.D.   On: 03/25/2024 23:31   DG Femur Min 2 Views Left Result Date: 03/25/2024 CLINICAL DATA:  Status post fall. EXAM: LEFT FEMUR 2 VIEWS COMPARISON:  None Available. FINDINGS: An acute fracture deformity is seen extending through the neck of the proximal left femur. There is no evidence of dislocation. A small suprapatellar effusion is seen within the visualized portion of the left knee. Soft tissues are otherwise unremarkable. IMPRESSION: Acute fracture of the proximal left femur. Electronically Signed   By: Virgle Grime M.D.   On: 03/25/2024 22:03   DG Hip Unilat W or Wo Pelvis 2-3 Views  Left Result Date: 03/25/2024 CLINICAL DATA:  Status post fall. EXAM: DG HIP (WITH OR WITHOUT PELVIS) 2-3V LEFT COMPARISON:  None Available. FINDINGS: An acute, partially impacted fracture deformity is seen involving the neck of the proximal left femur. There is no evidence of dislocation. Mild degenerative changes are seen in the form of joint space narrowing and acetabular sclerosis. IMPRESSION: Acute fracture of the proximal left femur. Electronically Signed   By: Virgle Grime M.D.   On: 03/25/2024 21:57       LOS: 1 day   Cheryl Hopkins  [facility] Pager on [email or web address]  03/26/2024, 9:15 AM

## 2024-03-27 DIAGNOSIS — J479 Bronchiectasis, uncomplicated: Secondary | ICD-10-CM | POA: Diagnosis not present

## 2024-03-27 DIAGNOSIS — S72002A Fracture of unspecified part of neck of left femur, initial encounter for closed fracture: Secondary | ICD-10-CM | POA: Diagnosis not present

## 2024-03-27 DIAGNOSIS — I152 Hypertension secondary to endocrine disorders: Secondary | ICD-10-CM | POA: Diagnosis not present

## 2024-03-27 DIAGNOSIS — E1159 Type 2 diabetes mellitus with other circulatory complications: Secondary | ICD-10-CM | POA: Diagnosis not present

## 2024-03-27 LAB — CBC
HCT: 36.4 % (ref 36.0–46.0)
Hemoglobin: 12 g/dL (ref 12.0–15.0)
MCH: 28 pg (ref 26.0–34.0)
MCHC: 33 g/dL (ref 30.0–36.0)
MCV: 84.8 fL (ref 80.0–100.0)
Platelets: 227 10*3/uL (ref 150–400)
RBC: 4.29 MIL/uL (ref 3.87–5.11)
RDW: 13.2 % (ref 11.5–15.5)
WBC: 13.6 10*3/uL — ABNORMAL HIGH (ref 4.0–10.5)
nRBC: 0 % (ref 0.0–0.2)

## 2024-03-27 LAB — GLUCOSE, CAPILLARY
Glucose-Capillary: 145 mg/dL — ABNORMAL HIGH (ref 70–99)
Glucose-Capillary: 287 mg/dL — ABNORMAL HIGH (ref 70–99)
Glucose-Capillary: 181 mg/dL — ABNORMAL HIGH (ref 70–99)
Glucose-Capillary: 245 mg/dL — ABNORMAL HIGH (ref 70–99)
Glucose-Capillary: 219 mg/dL — ABNORMAL HIGH (ref 70–99)

## 2024-03-27 LAB — BASIC METABOLIC PANEL WITH GFR
Anion gap: 7 (ref 5–15)
BUN: 7 mg/dL — ABNORMAL LOW (ref 8–23)
CO2: 30 mmol/L (ref 22–32)
Calcium: 8.4 mg/dL — ABNORMAL LOW (ref 8.9–10.3)
Chloride: 100 mmol/L (ref 98–111)
Creatinine, Ser: 0.59 mg/dL (ref 0.44–1.00)
GFR, Estimated: >60 mL/min (ref >60)
Glucose, Bld: 156 mg/dL — ABNORMAL HIGH (ref 70–99)
Potassium: 3.5 mmol/L (ref 3.5–5.1)
Sodium: 137 mmol/L (ref 135–145)

## 2024-03-27 MED ORDER — INSULIN ASPART 100 UNIT/ML IJ SOLN
0.0000 [IU] | Freq: Three times a day (TID) | INTRAMUSCULAR | Status: DC
  Administered 2024-03-27: 3 [IU] via SUBCUTANEOUS
  Administered 2024-03-27: 5 [IU] via SUBCUTANEOUS
  Administered 2024-03-28: 3 [IU] via SUBCUTANEOUS
  Administered 2024-03-28: 11 [IU] via SUBCUTANEOUS
  Administered 2024-03-28: 5 [IU] via SUBCUTANEOUS
  Administered 2024-03-29: 8 [IU] via SUBCUTANEOUS
  Administered 2024-03-29 – 2024-03-30 (×2): 3 [IU] via SUBCUTANEOUS

## 2024-03-27 MED ORDER — INSULIN ASPART 100 UNIT/ML IJ SOLN
0.0000 [IU] | Freq: Every day | INTRAMUSCULAR | Status: DC
  Administered 2024-03-27: 2 [IU] via SUBCUTANEOUS
  Administered 2024-03-28: 4 [IU] via SUBCUTANEOUS
  Administered 2024-03-29: 5 [IU] via SUBCUTANEOUS

## 2024-03-27 MED ORDER — ENSURE ENLIVE PO LIQD: 237.0000 mL | Freq: Two times a day (BID) | ORAL | Status: DC

## 2024-03-27 MED ORDER — POTASSIUM CHLORIDE CRYS ER 20 MEQ PO TBCR
40.0000 meq | EXTENDED_RELEASE_TABLET | Freq: Once | ORAL | Status: AC
  Administered 2024-03-27: 40 meq via ORAL
  Filled 2024-03-27: qty 2

## 2024-03-27 NOTE — Plan of Care (Signed)
  Problem: Tissue Perfusion: Goal: Adequacy of tissue perfusion will improve Outcome: Progressing   Problem: Education: Goal: Knowledge of General Education information will improve Description: Including pain rating scale, medication(s)/side effects and non-pharmacologic comfort measures Outcome: Progressing   Problem: Clinical Measurements: Goal: Ability to maintain clinical measurements within normal limits will improve Outcome: Progressing   Problem: Nutrition: Goal: Adequate nutrition will be maintained Outcome: Progressing   Problem: Coping: Goal: Level of anxiety will decrease Outcome: Progressing   Problem: Elimination: Goal: Will not experience complications related to bowel motility Outcome: Progressing   Problem: Safety: Goal: Ability to remain free from injury will improve Outcome: Progressing

## 2024-03-27 NOTE — TOC CAGE-AID Note (Signed)
 Transition of Care [facility]) - CAGE-AID Screening   Patient Details  Name: Cheryl Hopkins MRN: [id number] Date of Birth: Mar 19, 1951  Transition of [facility]) CM/SW Contact:    Dee Maday E Artavis Cowie, LCSW Phone Number: 03/27/2024, 9:48 AM   Clinical Narrative:    CAGE-AID Screening:    Have You Ever Felt You Ought to Cut Down on Your Drinking or Drug Use?: No Have People Annoyed You By [employer] Your Drinking Or Drug Use?: No Have You Felt Bad Or Guilty About Your Drinking Or Drug Use?: No Have You Ever Had a Drink or Used Drugs First Thing In The Morning to Steady Your Nerves or to Get Rid of a Hangover?: No CAGE-AID Score: 0  Substance Abuse Education Offered: No

## 2024-03-27 NOTE — Progress Notes (Signed)
 [facility] PROGRESS NOTE   Jessilyn Catino ZOX:[id number] DOB: 02-13-1951 DOA: 03/25/2024  PCP: Elvira Hammersmith, MD  Brief History: 73 y.o. female with medical history significant for hypertension, hyperlipidemia, type 2 diabetes mellitus, and bronchiectasis who presents with severe left hip pain after fall.  Patient was found to have a left hip fracture.  She was hospitalized for further management.  Consultants: Orthopedics  Procedures: ORIF left hip    Subjective/Interval History: Patient does not speak [place].  Her son was able to interpret.  Patient feels well this morning.  Pain is reasonably well-controlled.  No nausea vomiting.  No abdominal pain.    Assessment/Plan:  Left hip fracture Secondary to mechanical fall. Underwent surgery on 5/25.  Await PT and OT evaluation. Aspirin twice a day ordered for DVT prophylaxis.  Diabetes mellitus type 2 HbA1c was 8.2 in January. Oral agents on hold.  SSI.  Essential hypertension Amlodipine  being continued.  Blood pressure is reasonably well-controlled.  Supplement potassium.  Hyperlipidemia On statin.  Leukocytosis Reactive.  Stable this morning.  She is afebrile.  Do not suspect any infection.  History of bronchiectasis Has been seen by pulmonology previously.  Respiratory status is stable.  Chest x-ray shows chronic changes without any acute findings. Noted to be on oxygen.  Will try to wean her off of it prior to discharge.   DVT Prophylaxis: Aspirin twice a day Code Status: Full code Family Communication: Discussed with patient's daughter and son Disposition Plan: To be determined    Medications: Scheduled:  acetaminophen   1,000 mg Oral Q8H   amLODipine   10 mg Oral Daily   aspirin  81 mg Oral BID   insulin aspart  0-6 Units Subcutaneous Q4H   rosuvastatin   20 mg Oral Daily   Continuous:   PRN:HYDROmorphone (DILAUDID) injection, ipratropium-albuterol , methocarbamol **OR** methocarbamol  (ROBAXIN) injection, oxyCODONE, polyethylene glycol, prochlorperazine  Antibiotics: Anti-infectives (From admission, onward)    Start     Dose/Rate Route Frequency Ordered Stop   03/26/24 1600  ceFAZolin (ANCEF) IVPB 2g/100 mL premix        2 g 200 mL/hr over 30 Minutes Intravenous Every 6 hours 03/26/24 1140 03/27/24 0519   03/26/24 0800  ceFAZolin (ANCEF) IVPB 2g/100 mL premix        2 g 200 mL/hr over 30 Minutes Intravenous To Surgery 03/26/24 0709 03/26/24 0949       Objective:  Vital Signs  Vitals:   03/26/24 1948 03/26/24 2340 03/27/24 0412 03/27/24 0740  BP: (!) 127/57 123/60 (!) 111/53 (!) 132/55  Pulse: 86 87 80 95  Resp: 16 16 15 16   Temp: 98.3 F (36.8 C) 98.6 F (37 C) 98.4 F (36.9 C) 98.6 F (37 C)  TempSrc:      SpO2: 95% 97% 96% 97%  Weight:      Height:        Intake/Output Summary (Last 24 hours) at 03/27/2024 1009 Last data filed at 03/27/2024 0743 Gross per 24 hour  Intake 1580 ml  Output 2300 ml  Net -720 ml   Filed Weights   03/25/24 2115  Weight: 73.5 kg   General appearance: Awake alert.  In no distress Resp: Clear to auscultation bilaterally.  Normal effort Cardio: S1-S2 is normal regular.  No S3-S4.  No rubs murmurs or bruit GI: Abdomen is soft.  Nontender nondistended.  Bowel sounds are present normal.  No masses organomegaly   Lab Results:  Data Reviewed: I have personally reviewed following labs and reports  of the imaging studies  CBC: Recent Labs  Lab 03/25/24 2211 03/26/24 0525 03/27/24 0601  WBC 12.8* 14.1* 13.6*  NEUTROABS 10.9*  --   --   HGB 14.6 13.7 12.0  HCT 44.7 41.9 36.4  MCV 86.0 83.6 84.8  PLT 303 283 227    Basic Metabolic Panel: Recent Labs  Lab 03/25/24 2211 03/26/24 0525 03/27/24 0601  NA 136 138 137  K 3.7 3.8 3.5  CL 99 99 100  CO2 27 29 30   GLUCOSE 200* 140* 156*  BUN 12 7* 7*  CREATININE 0.83 0.56 0.59  CALCIUM  9.1 9.1 8.4*    GFR: Estimated Creatinine Clearance: 65.1 mL/min (by C-G  formula based on SCr of 0.59 mg/dL).    CBG: Recent Labs  Lab 03/26/24 1706 03/26/24 2001 03/26/24 2339 03/27/24 0334 03/27/24 0816  GLUCAP 290* 213* 202* 145* 287*     Radiology Studies: DG FEMUR MIN 2 VIEWS LEFT Result Date: 03/26/2024 CLINICAL DATA:  [id number] Post-operative state [id number] EXAM: LEFT FEMUR 2 VIEWS COMPARISON:  Mar 26, 2024, Mar 25, 2024 FINDINGS: Status post ORIF of the proximal femur. Orthopedic hardware is intact and without periprosthetic fracture or lucency. Fracture fragments are in improved alignment. Limited assessment on lateral radiograph secondary to poor penetration. Small volume soft tissue air and overlying skin staples. IMPRESSION: Expected postsurgical appearance status post ORIF of the proximal femur Electronically Signed   By: Clancy Crimes M.D.   On: 03/26/2024 11:57   DG HIP UNILAT WITH PELVIS 2-3 VIEWS LEFT Result Date: 03/26/2024 CLINICAL DATA:  Elective surgery. EXAM: DG HIP (WITH OR WITHOUT PELVIS) 2-3V LEFT COMPARISON:  Preoperative imaging FINDINGS: Two fluoroscopic spot views of the left hip submitted from the operating room. 3 screws traverse the left femoral neck fracture. Fluoroscopy time 1 minutes 19 seconds. Dose 11.1 mGy. IMPRESSION: Intraoperative fluoroscopy during left femoral neck fracture fixation. Electronically Signed   By: Chadwick Colonel M.D.   On: 03/26/2024 11:30   DG C-Arm 1-60 Min-No Report Result Date: 03/26/2024 Fluoroscopy was utilized by the requesting physician.  No radiographic interpretation.   Chest Portable 1 View Result Date: 03/26/2024 CLINICAL DATA:  Preop for hip fracture EXAM: PORTABLE CHEST 1 VIEW COMPARISON:  Radiograph 11/29/2023 FINDINGS: Stable cardiomediastinal silhouette. Aortic atherosclerotic calcification. Chronic bronchitic changes. Left basilar atelectasis/scarring. Otherwise no focal consolidation, pleural effusion, or pneumothorax. No displaced rib fractures. IMPRESSION: Chronic lung changes  similar to 11/29/2023. No acute cardiopulmonary disease. Electronically Signed   By: Rozell Cornet M.D.   On: 03/26/2024 01:04   CT Hip Left Wo Contrast Result Date: 03/25/2024 CLINICAL DATA:  Status post trauma. EXAM: CT OF THE LEFT HIP WITHOUT CONTRAST TECHNIQUE: Multidetector CT imaging of the left hip was performed according to the standard protocol. Multiplanar CT image reconstructions were also generated. RADIATION DOSE REDUCTION: This exam was performed according to the departmental dose-optimization program which includes automated exposure control, adjustment of the mA and/or kV according to patient size and/or use of iterative reconstruction technique. COMPARISON:  None Available. FINDINGS: Bones/Joint/Cartilage Acute, nondisplaced fracture deformities are seen involving the medial aspect of the left superior pubic ramus and midportion of the left inferior pubic ramus. An additional acute, partially impacted fracture deformity is seen extending through the neck of the proximal left femur. There is no evidence of dislocation. Mild degenerative changes are seen in the form of joint space narrowing and acetabular sclerosis. Additional degenerative changes are seen within the visualized portion of the lower lumbar spine. Ligaments Suboptimally assessed  by CT. Muscles and Tendons Limited in evaluation in the absence of intravenous contrast and otherwise unremarkable. Soft tissues Mild soft tissue thickening and very small hematoma formation is seen along the previously noted fracture sites. IMPRESSION: 1. Acute, nondisplaced fracture deformities involving the left superior and inferior pubic rami. 2. Acute, partially impacted fracture deformity extending through the neck of the proximal left femur. Electronically Signed   By: Virgle Grime M.D.   On: 03/25/2024 23:31   DG Femur Min 2 Views Left Result Date: 03/25/2024 CLINICAL DATA:  Status post fall. EXAM: LEFT FEMUR 2 VIEWS COMPARISON:  None  Available. FINDINGS: An acute fracture deformity is seen extending through the neck of the proximal left femur. There is no evidence of dislocation. A small suprapatellar effusion is seen within the visualized portion of the left knee. Soft tissues are otherwise unremarkable. IMPRESSION: Acute fracture of the proximal left femur. Electronically Signed   By: Virgle Grime M.D.   On: 03/25/2024 22:03   DG Hip Unilat W or Wo Pelvis 2-3 Views Left Result Date: 03/25/2024 CLINICAL DATA:  Status post fall. EXAM: DG HIP (WITH OR WITHOUT PELVIS) 2-3V LEFT COMPARISON:  None Available. FINDINGS: An acute, partially impacted fracture deformity is seen involving the neck of the proximal left femur. There is no evidence of dislocation. Mild degenerative changes are seen in the form of joint space narrowing and acetabular sclerosis. IMPRESSION: Acute fracture of the proximal left femur. Electronically Signed   By: Virgle Grime M.D.   On: 03/25/2024 21:57       LOS: 2 days   Tyrone Balash Lyndon Santiago  [facility] Pager on [email or web address]  03/27/2024, 10:09 AM

## 2024-03-27 NOTE — Evaluation (Signed)
 Physical Therapy Evaluation Patient Details Name: Cheryl Hopkins MRN: [id number] DOB: 05/24/1951 Today's Date: 03/27/2024  History of Present Illness  73 y.o. female presents to [facility] hospital on 03/25/2024 with L hip pain after a fall. CT scan notable for L femoral neck fx and acute nondisplaced fxs of L superior and inferior pubic rami. Pt underwent L femoral neck fx screw fixation on 5/25. PMH includes HTN, HLD, DMII, bronchiectasis.  Clinical Impression  Pt presents to PT with deficits in strength, power, functional mobility, gait, balance, endurance. Pt is able to transfer and ambulate for short household distances with support of RW and physical assistance. Pt is limited by pain a L hip at this time, PT provides education on available pain medications prescribed by the MD as it appears the patient has only been taking tylenol  today. Pt will benefit from frequent mobilization in an effort to improve strength and activity tolerance. PT will continue to follow in the acute setting in an effort to reduce assistance requirements during gait and transfers, and to initiate stair training. Pt's son expresses the desire for the pt to discharge home, with hopes of a family member being able to fly from [place] to assist in her care. He is concerned the language barrier could prove detrimental if the pt were to discharge to a SNF. PT will continue to push to achieve a mobility level appropriate for discharge home.      If plan is discharge home, recommend the following: A little help with walking and/or transfers;A little help with bathing/dressing/bathroom;Assistance with cooking/housework;Assist for transportation;Help with stairs or ramp for entrance   Can travel by private vehicle        Equipment Recommendations Rolling walker (2 wheels);BSC/3in1  Recommendations for Other Services       Functional Status Assessment Patient has had a recent decline in their functional status and demonstrates the  ability to make significant improvements in function in a reasonable and predictable amount of time.     Precautions / Restrictions Precautions Precautions: Fall Recall of Precautions/Restrictions: Intact Restrictions Weight Bearing Restrictions Per Provider Order: Yes LLE Weight Bearing Per Provider Order: Weight bearing as tolerated      Mobility  Bed Mobility Overal bed mobility: Needs Assistance Bed Mobility: Sit to Supine       Sit to supine: Min assist   General bed mobility comments: assist for BLE    Transfers Overall transfer level: Needs assistance Equipment used: Rolling walker (2 wheels) Transfers: Sit to/from Stand Sit to Stand: Min assist, +2 physical assistance           General transfer comment: standing from commode    Ambulation/Gait Ambulation/Gait assistance: Min assist Gait Distance (Feet): 15 Feet Assistive device: Rolling walker (2 wheels) Gait Pattern/deviations: Step-to pattern Gait velocity: reduced Gait velocity interpretation: <1.31 ft/sec, indicative of household ambulator   General Gait Details: slowed step-to gait with reduced stance time on LLE  Stairs            Wheelchair Mobility     Tilt Bed    Modified Rankin (Stroke Patients Only)       Balance Overall balance assessment: Needs assistance Sitting-balance support: No upper extremity supported, Feet supported Sitting balance-Leahy Scale: Fair     Standing balance support: Bilateral upper extremity supported, Reliant on assistive device for balance Standing balance-Leahy Scale: Poor  Pertinent Vitals/Pain Pain Assessment Pain Assessment: 0-10 Pain Score: 8  Pain Location: L hip Pain Descriptors / Indicators: Aching Pain Intervention(s): Patient requesting pain meds-RN notified    Home Living Family/patient expects to be discharged to:: Private residence Living Arrangements: Children Available Help at  Discharge: Family;Available PRN/intermittently Type of Home: Apartment Home Access: Stairs to enter Entrance Stairs-Rails: Doctor, general practice of Steps: 10   Home Layout: One level Home Equipment: None      Prior Function Prior Level of Function : Independent/Modified Independent             Mobility Comments: ambulatory without DME, was exercising in apartment gym when this injury occurred ADLs Comments: assistance for transportation     Extremity/Trunk Assessment   Upper Extremity Assessment Upper Extremity Assessment: Overall WFL for tasks assessed    Lower Extremity Assessment Lower Extremity Assessment: LLE deficits/detail LLE Deficits / Details: generalized weakness largely related to pain    Cervical / Trunk Assessment Cervical / Trunk Assessment: Normal  Communication   Communication Communication: Other (comment) (difficult to assess as pt's son is interpreting. pt responds appropriately throughout session)    Cognition Arousal: Alert Behavior During Therapy: WFL for tasks assessed/performed   PT - Cognitive impairments: Difficult to assess Difficult to assess due to: Non-English speaking                     PT - Cognition Comments: pt's son interprets during session as video interpreter services does not have [place] language. Pt responds appropriately to communication during session. Following commands: Intact       Cueing Cueing Techniques: Verbal cues, Visual cues     General Comments General comments (skin integrity, edema, etc.): pt in NAD    Exercises General Exercises - Lower Extremity Quad Sets: AROM, Left, 5 reps, Supine Heel Slides: Left, AAROM, 5 reps, Supine Hip ABduction/ADduction: AROM, Left, 5 reps, Supine   Assessment/Plan    PT Assessment Patient needs continued PT services  PT Problem List Decreased strength;Decreased activity tolerance;Decreased mobility;Decreased balance;Decreased knowledge of use of  DME;Decreased safety awareness;Pain       PT Treatment Interventions DME instruction;Gait training;Stair training;Functional mobility training;Therapeutic exercise;Therapeutic activities;Balance training;Neuromuscular re-education;Cognitive remediation;Patient/family education    PT Goals (Current goals can be found in the Care Plan section)  Acute Rehab PT Goals Patient Stated Goal: to return to independence PT Goal Formulation: With patient/family Time For Goal Achievement: 04/10/24 Potential to Achieve Goals: Good    Frequency Min 3X/week     Co-evaluation               AM-PAC PT "6 Clicks" Mobility  Outcome Measure Help needed turning from your back to your side while in a flat bed without using bedrails?: A Little Help needed moving from lying on your back to sitting on the side of a flat bed without using bedrails?: A Little Help needed moving to and from a bed to a chair (including a wheelchair)?: A Little Help needed standing up from a chair using your arms (e.g., wheelchair or bedside chair)?: A Little Help needed to walk in hospital room?: A Lot Help needed climbing 3-5 steps with a railing? : Total 6 Click Score: 15    End of Session   Activity Tolerance: Patient limited by pain Patient left: in bed;with call bell/phone within reach;with bed alarm set;with family/visitor present Nurse Communication: Mobility status PT Visit Diagnosis: Other abnormalities of gait and mobility (R26.89);Muscle weakness (generalized) (M62.81);Pain Pain - Right/Left:  Left Pain - part of body: Hip    Time: 1308-6578 PT Time Calculation (min) (ACUTE ONLY): 32 min   Charges:   PT Evaluation $PT Eval Low Complexity: 1 Low   PT General Charges $$ ACUTE PT VISIT: 1 Visit         Rexie Catena, PT, DPT Acute Rehabilitation Office [phone]   Rexie Catena 03/27/2024, 2:57 PM

## 2024-03-27 NOTE — Progress Notes (Signed)
 Orthopedic Surgery Progress Note   Assessment: Patient is a 73 y.o. female with left nondisplaced femoral neck fracture status post internal fixation   Plan: -Operative plans: complete -Diet: diabetic -DVT ppx: aspirin 81mg  BID -Antibiotics: ancef x2 post-op doses -Weight bearing status: as tolerated -PT evaluate and treat -Pain control -Dispo: per primary  ___________________________________________________________________________  Subjective: No acute events overnight. Pain well controlled. Has not worked with Pt yet. Denies paresthesias and numbness.    Physical Exam:  General: no acute distress, appears stated age Neurologic: alert, answering questions appropriately, following commands Respiratory: unlabored breathing on room air, symmetric chest rise Psychiatric: appropriate affect, normal cadence to speech  MSK:   -Left lower extremity  Dressings over lateral hip c/d/i EHL/TA/GSC intact Plantarflexes and dorsiflexes toes Sensation intact to light touch in sural, saphenous, tibial, deep peroneal, and superficial peroneal nerve distributions Foot warm and well perfused, palpable DP pulse   Patient name: Cheryl Hopkins Patient MRN: [id number] Date: 03/27/24

## 2024-03-27 NOTE — Progress Notes (Signed)
 Initial Nutrition Assessment  DOCUMENTATION CODES:   Not applicable  INTERVENTION:   Encourage family to bring in outside food if pt prefers.   Ensure Enlive po BID, each supplement provides 350 kcal and 20 grams of protein.  Pt currently only on sliding scale insulin, hx of poorly controlled DM, CBGs in 200s. Recommend additional coverage   NUTRITION DIAGNOSIS:   Increased nutrient needs related to post-op healing as evidenced by estimated needs.  GOAL:   Patient will meet greater than or equal to 90% of their needs  MONITOR:   PO intake, Supplement acceptance  REASON FOR ASSESSMENT:   Consult Assessment of nutrition requirement/status  ASSESSMENT:   73 yo female admitted post fall with L. Hip fracture. PMH includes HTN, HLD, DM, bronchiectasis.  5/25 OR: fixation of L non-displaced left femoral neck fracture  Pt does not speak English, needs interpretor: Hausa  Recorded po intake 100% of meals  Labs: CBGs 145-290  Meds:  SS Novolog  Diet Order:   Diet Order             Diet Carb Modified Fluid consistency: Thin  Diet effective now                   EDUCATION NEEDS:   No education needs have been identified at this time  Skin:  Skin Assessment: Skin Integrity Issues: Skin Integrity Issues:: Incisions Incisions: left hip  Last BM:  PTA  Height:   Ht Readings from Last 1 Encounters:  03/25/24 5\' 8"  (1.727 m)    Weight:   Wt Readings from Last 1 Encounters:  03/25/24 73.5 kg    BMI:  Body mass index is 24.64 kg/m.  Estimated Nutritional Needs:   Kcal:  1700-1800 kcals  Protein:  70-80 g  Fluid:  >/= 1.7 L  Norvel Beer MS, RDN, LDN, CNSC Registered Dietitian 3 Clinical Nutrition RD Inpatient Contact Info in Amion

## 2024-03-28 ENCOUNTER — Other Ambulatory Visit (HOSPITAL_COMMUNITY): Payer: Self-pay

## 2024-03-28 ENCOUNTER — Encounter (HOSPITAL_COMMUNITY): Payer: Self-pay | Admitting: Orthopedic Surgery

## 2024-03-28 DIAGNOSIS — S72002A Fracture of unspecified part of neck of left femur, initial encounter for closed fracture: Secondary | ICD-10-CM | POA: Diagnosis not present

## 2024-03-28 LAB — GLUCOSE, CAPILLARY
Glucose-Capillary: 216 mg/dL — ABNORMAL HIGH (ref 70–99)
Glucose-Capillary: 337 mg/dL — ABNORMAL HIGH (ref 70–99)
Glucose-Capillary: 170 mg/dL — ABNORMAL HIGH (ref 70–99)
Glucose-Capillary: 317 mg/dL — ABNORMAL HIGH (ref 70–99)

## 2024-03-28 LAB — CBC
HCT: 38.1 % (ref 36.0–46.0)
Hemoglobin: 12.6 g/dL (ref 12.0–15.0)
MCH: 27.9 pg (ref 26.0–34.0)
MCHC: 33.1 g/dL (ref 30.0–36.0)
MCV: 84.3 fL (ref 80.0–100.0)
Platelets: 220 10*3/uL (ref 150–400)
RBC: 4.52 MIL/uL (ref 3.87–5.11)
RDW: 13.3 % (ref 11.5–15.5)
WBC: 13.9 10*3/uL — ABNORMAL HIGH (ref 4.0–10.5)
nRBC: 0 % (ref 0.0–0.2)

## 2024-03-28 LAB — BASIC METABOLIC PANEL WITH GFR
Anion gap: 9 (ref 5–15)
BUN: 5 mg/dL — ABNORMAL LOW (ref 8–23)
CO2: 30 mmol/L (ref 22–32)
Calcium: 8.7 mg/dL — ABNORMAL LOW (ref 8.9–10.3)
Chloride: 98 mmol/L (ref 98–111)
Creatinine, Ser: 0.57 mg/dL (ref 0.44–1.00)
GFR, Estimated: >60 mL/min (ref >60)
Glucose, Bld: 205 mg/dL — ABNORMAL HIGH (ref 70–99)
Potassium: 3.6 mmol/L (ref 3.5–5.1)
Sodium: 137 mmol/L (ref 135–145)

## 2024-03-28 MED ORDER — METHOCARBAMOL 500 MG PO TABS
500.0000 mg | ORAL_TABLET | Freq: Four times a day (QID) | ORAL | 0 refills | Status: DC | PRN
  Filled 2024-03-28: qty 30, 8d supply, fill #0

## 2024-03-28 MED ORDER — SENNOSIDES-DOCUSATE SODIUM 8.6-50 MG PO TABS
2.0000 | ORAL_TABLET | Freq: Two times a day (BID) | ORAL | Status: DC
  Administered 2024-03-28 – 2024-03-29 (×3): 2 via ORAL
  Filled 2024-03-28 (×3): qty 2

## 2024-03-28 MED ORDER — POLYETHYLENE GLYCOL 3350 17 GM/SCOOP PO POWD
17.0000 g | Freq: Every day | ORAL | 0 refills | Status: AC | PRN
Start: 2024-03-28 — End: ?
  Filled 2024-03-28: qty 238, 14d supply, fill #0

## 2024-03-28 MED ORDER — OXYCODONE HCL 5 MG PO TABS
5.0000 mg | ORAL_TABLET | ORAL | 0 refills | Status: DC | PRN
  Filled 2024-03-28: qty 30, 5d supply, fill #0

## 2024-03-28 MED ORDER — SENNOSIDES-DOCUSATE SODIUM 8.6-50 MG PO TABS
2.0000 | ORAL_TABLET | Freq: Two times a day (BID) | ORAL | 0 refills | Status: AC
  Filled 2024-03-28: qty 60, 15d supply, fill #0

## 2024-03-28 MED ORDER — ACETAMINOPHEN 500 MG PO TABS
1000.0000 mg | ORAL_TABLET | Freq: Three times a day (TID) | ORAL | 0 refills | Status: AC
  Filled 2024-03-28: qty 42, 7d supply, fill #0

## 2024-03-28 MED ORDER — POTASSIUM CHLORIDE CRYS ER 20 MEQ PO TBCR
40.0000 meq | EXTENDED_RELEASE_TABLET | Freq: Once | ORAL | Status: AC
  Administered 2024-03-28: 40 meq via ORAL
  Filled 2024-03-28: qty 2

## 2024-03-28 MED ORDER — ASPIRIN 81 MG PO CHEW
81.0000 mg | CHEWABLE_TABLET | Freq: Two times a day (BID) | ORAL | 0 refills | Status: DC
  Filled 2024-03-28: qty 60, 30d supply, fill #0

## 2024-03-28 NOTE — Plan of Care (Signed)
  Problem: Metabolic: Goal: Ability to maintain appropriate glucose levels will improve Outcome: Progressing   Problem: Nutritional: Goal: Maintenance of adequate nutrition will improve Outcome: Progressing Goal: Progress toward achieving an optimal weight will improve Outcome: Progressing   Problem: Clinical Measurements: Goal: Will remain free from infection Outcome: Progressing

## 2024-03-28 NOTE — Inpatient Diabetes Management (Signed)
 Inpatient Diabetes Program Recommendations  AACE/ADA: New Consensus Statement on Inpatient Glycemic Control (2015)  Target Ranges:  Prepandial:   less than 140 mg/dL      Peak postprandial:   less than 180 mg/dL (1-2 hours)      Critically ill patients:  140 - 180 mg/dL   Lab Results  Component Value Date   GLUCAP 216 (H) 03/28/2024   HGBA1C 8.1 (H) 11/29/2023    Review of Glycemic Control  Latest Reference Range & Units 03/27/24 08:16 03/27/24 11:34 03/27/24 16:01 03/27/24 21:26 03/28/24 06:06  Glucose-Capillary 70 - 99 mg/dL 308 (H) 657 (H) 846 (H) 219 (H) 216 (H)   Diabetes history: DM 2 Outpatient Diabetes medications: Farxiga  10 mg Daily, Amaryl  2 mg Daily, metformin  500 mg bid, Januvia  50 mg Daily Current orders for Inpatient glycemic control:  Novolog 0-15 units tid + hs  Ensure Enlive bid between meals (40 grams of carbohydrates)  Inpatient Diabetes Program Recommendations:    -  Consider adding Novolog 2 units tid meal coverage if eating >50% of meals/supplement).  Thanks,  Eloise Hake RN, MSN, BC-ADM Inpatient Diabetes Coordinator Team Pager [phone] (8a-5p)

## 2024-03-28 NOTE — Discharge Summary (Addendum)
 [facility]  Physician Discharge Summary   Patient ID: Cheryl Hopkins MRN: [id number] DOB/AGE: [id number] y.o.  Admit date: 03/25/2024 Discharge date:   03/28/2024   PCP: Elvira Hammersmith, MD  DISCHARGE DIAGNOSES:    Closed fracture of neck of left femur (HCC)   Hyperlipidemia   Hypertension associated with diabetes (HCC)   Bronchiectasis without complication (HCC)   Diabetes mellitus without complication (HCC)   RECOMMENDATIONS FOR OUTPATIENT FOLLOW UP: Follow-up with Dr. Sulema Endo with orthopedics in 1 week   Home Health: PT and OT Equipment/Devices: Rolling walker  CODE STATUS: Full code  DISCHARGE CONDITION: fair  Diet recommendation: Modified carbohydrate  INITIAL HISTORY: 73 y.o. female with medical history significant for hypertension, hyperlipidemia, type 2 diabetes mellitus, and bronchiectasis who presents with severe left hip pain after fall.  Patient was found to have a left hip fracture.  She was hospitalized for further management.   Consultants: Orthopedics   Procedures: ORIF left hip  HOSPITAL COURSE:   Left hip fracture Secondary to mechanical fall. Underwent surgery on 5/25.  Aspirin twice a day ordered for DVT prophylaxis. Seen by physical therapy.  Home health is recommended.   Diabetes mellitus type 2 HbA1c was 8.2 in January. Resume home medications   Essential hypertension Resume home medications   Hyperlipidemia On statin.   Leukocytosis Reactive.  She remains afebrile   History of bronchiectasis/hypoxia Has been seen by pulmonology previously.  Respiratory status is stable.  Chest x-ray shows chronic changes without any acute findings. Noted to be on oxygen.  It appears that she desaturated when she worked with physical therapy today.  Might need home oxygen.  This could just be due to deconditioning in the setting of her bronchiectasis.  X-ray did not show any acute findings and she does not have any dyspnea at  rest. Amb referral sent to [facility].   Patient is stable.  Should be able to go home later today or tomorrow.  May need home oxygen.   PERTINENT LABS:  The results of significant diagnostics from this hospitalization (including imaging, microbiology, ancillary and laboratory) are listed below for reference.    Microbiology: Recent Results (from the past 240 hours)  Surgical pcr screen     Status: None   Collection Time: 03/26/24  5:51 AM   Specimen: Nasal Mucosa; Nasal Swab  Result Value Ref Range Status   MRSA, PCR NEGATIVE NEGATIVE Final   Staphylococcus aureus NEGATIVE NEGATIVE Final    Comment: (NOTE) The Xpert SA Assay (FDA approved for NASAL specimens in patients 68 years of age and older), is one component of a comprehensive surveillance program. It is not intended to diagnose infection nor to guide or monitor treatment. Performed at [facility], [address] [place], [place] [address]      Labs:   Basic Metabolic Panel: Recent Labs  Lab 03/25/24 2211 03/26/24 0525 03/27/24 0601 03/28/24 0546  NA 136 138 137 137  K 3.7 3.8 3.5 3.6  CL 99 99 100 98  CO2 27 29 30 30   GLUCOSE 200* 140* 156* 205*  BUN 12 7* 7* 5*  CREATININE 0.83 0.56 0.59 0.57  CALCIUM  9.1 9.1 8.4* 8.7*    CBC: Recent Labs  Lab 03/25/24 2211 03/26/24 0525 03/27/24 0601 03/28/24 0546  WBC 12.8* 14.1* 13.6* 13.9*  NEUTROABS 10.9*  --   --   --   HGB 14.6 13.7 12.0 12.6  HCT 44.7 41.9 36.4 38.1  MCV 86.0 83.6 84.8 84.3  PLT 303 283 227 220     CBG: Recent Labs  Lab 03/27/24 1134 03/27/24 1601 03/27/24 2126 03/28/24 0606 03/28/24 1108  GLUCAP 181* 245* 219* 216* 337*     IMAGING STUDIES DG FEMUR MIN 2 VIEWS LEFT Result Date: 03/26/2024 CLINICAL DATA:  [id number] Post-operative state [id number] EXAM: LEFT FEMUR 2 VIEWS COMPARISON:  Mar 26, 2024, Mar 25, 2024 FINDINGS: Status post ORIF of the proximal femur. Orthopedic hardware is intact and without periprosthetic fracture or  lucency. Fracture fragments are in improved alignment. Limited assessment on lateral radiograph secondary to poor penetration. Small volume soft tissue air and overlying skin staples. IMPRESSION: Expected postsurgical appearance status post ORIF of the proximal femur Electronically Signed   By: Clancy Crimes M.D.   On: 03/26/2024 11:57   DG HIP UNILAT WITH PELVIS 2-3 VIEWS LEFT Result Date: 03/26/2024 CLINICAL DATA:  Elective surgery. EXAM: DG HIP (WITH OR WITHOUT PELVIS) 2-3V LEFT COMPARISON:  Preoperative imaging FINDINGS: Two fluoroscopic spot views of the left hip submitted from the operating room. 3 screws traverse the left femoral neck fracture. Fluoroscopy time 1 minutes 19 seconds. Dose 11.1 mGy. IMPRESSION: Intraoperative fluoroscopy during left femoral neck fracture fixation. Electronically Signed   By: Chadwick Colonel M.D.   On: 03/26/2024 11:30   DG C-Arm 1-60 Min-No Report Result Date: 03/26/2024 Fluoroscopy was utilized by the requesting physician.  No radiographic interpretation.   Chest Portable 1 View Result Date: 03/26/2024 CLINICAL DATA:  Preop for hip fracture EXAM: PORTABLE CHEST 1 VIEW COMPARISON:  Radiograph 11/29/2023 FINDINGS: Stable cardiomediastinal silhouette. Aortic atherosclerotic calcification. Chronic bronchitic changes. Left basilar atelectasis/scarring. Otherwise no focal consolidation, pleural effusion, or pneumothorax. No displaced rib fractures. IMPRESSION: Chronic lung changes similar to 11/29/2023. No acute cardiopulmonary disease. Electronically Signed   By: Rozell Cornet M.D.   On: 03/26/2024 01:04   CT Hip Left Wo Contrast Result Date: 03/25/2024 CLINICAL DATA:  Status post trauma. EXAM: CT OF THE LEFT HIP WITHOUT CONTRAST TECHNIQUE: Multidetector CT imaging of the left hip was performed according to the standard protocol. Multiplanar CT image reconstructions were also generated. RADIATION DOSE REDUCTION: This exam was performed according to the  departmental dose-optimization program which includes automated exposure control, adjustment of the mA and/or kV according to patient size and/or use of iterative reconstruction technique. COMPARISON:  None Available. FINDINGS: Bones/Joint/Cartilage Acute, nondisplaced fracture deformities are seen involving the medial aspect of the left superior pubic ramus and midportion of the left inferior pubic ramus. An additional acute, partially impacted fracture deformity is seen extending through the neck of the proximal left femur. There is no evidence of dislocation. Mild degenerative changes are seen in the form of joint space narrowing and acetabular sclerosis. Additional degenerative changes are seen within the visualized portion of the lower lumbar spine. Ligaments Suboptimally assessed by CT. Muscles and Tendons Limited in evaluation in the absence of intravenous contrast and otherwise unremarkable. Soft tissues Mild soft tissue thickening and very small hematoma formation is seen along the previously noted fracture sites. IMPRESSION: 1. Acute, nondisplaced fracture deformities involving the left superior and inferior pubic rami. 2. Acute, partially impacted fracture deformity extending through the neck of the proximal left femur. Electronically Signed   By: Virgle Grime M.D.   On: 03/25/2024 23:31   DG Femur Min 2 Views Left Result Date: 03/25/2024 CLINICAL DATA:  Status post fall. EXAM: LEFT FEMUR 2 VIEWS COMPARISON:  None Available. FINDINGS: An acute fracture deformity is seen extending through the neck of the  proximal left femur. There is no evidence of dislocation. A small suprapatellar effusion is seen within the visualized portion of the left knee. Soft tissues are otherwise unremarkable. IMPRESSION: Acute fracture of the proximal left femur. Electronically Signed   By: Virgle Grime M.D.   On: 03/25/2024 22:03   DG Hip Unilat W or Wo Pelvis 2-3 Views Left Result Date: 03/25/2024 CLINICAL  DATA:  Status post fall. EXAM: DG HIP (WITH OR WITHOUT PELVIS) 2-3V LEFT COMPARISON:  None Available. FINDINGS: An acute, partially impacted fracture deformity is seen involving the neck of the proximal left femur. There is no evidence of dislocation. Mild degenerative changes are seen in the form of joint space narrowing and acetabular sclerosis. IMPRESSION: Acute fracture of the proximal left femur. Electronically Signed   By: Virgle Grime M.D.   On: 03/25/2024 21:57   DG Shoulder Left Result Date: 03/16/2024 CLINICAL DATA:  Left shoulder pain EXAM: LEFT SHOULDER - 2+ VIEW COMPARISON:  None Available. FINDINGS: Normal alignment. No acute fracture or dislocation. Acromioclavicular joint space is preserved. Glenohumeral joint space is preserved. Mild calcific tendinitis of the insertion of the rotator cuff upon the greater tuberosity. Limited evaluation of the left hemithorax is unremarkable. IMPRESSION: 1. Mild calcific tendinitis. Electronically Signed   By: Worthy Heads M.D.   On: 03/16/2024 20:54    DISCHARGE EXAMINATION: Vitals:   03/27/24 1503 03/27/24 1933 03/28/24 0359 03/28/24 0737  BP: (!) 122/57 (!) 111/52 139/65 (!) 146/61  Pulse: 98 90 (!) 109 96  Resp: 16 15 17 16   Temp: 98.2 F (36.8 C) 98.3 F (36.8 C) 99.8 F (37.7 C) 98.6 F (37 C)  TempSrc:   Oral   SpO2: 100% 92% 94% 95%  Weight:      Height:       General appearance: Awake alert.  In no distress Resp: Clear to auscultation bilaterally.  Normal effort Cardio: S1-S2 is normal regular.  No S3-S4.  No rubs murmurs or bruit GI: Abdomen is soft.  Nontender nondistended.  Bowel sounds are present normal.  No masses organomegaly    DISPOSITION: Home with home health  Discharge Instructions     Call MD for:  difficulty breathing, headache or visual disturbances   Complete by: As directed    Call MD for:  extreme fatigue   Complete by: As directed    Call MD for:  persistant dizziness or light-headedness    Complete by: As directed    Call MD for:  persistant nausea and vomiting   Complete by: As directed    Call MD for:  severe uncontrolled pain   Complete by: As directed    Call MD for:  temperature >100.4   Complete by: As directed    Diet - low sodium heart healthy   Complete by: As directed    Discharge instructions   Complete by: As directed    Please follow instructions provided by the orthopedic surgeon.  You were cared for by a hospitalist during your hospital stay. If you have any questions about your discharge medications or the care you received while you were in the hospital after you are discharged, you can call the unit and asked to speak with the hospitalist on call if the hospitalist that took care of you is not available. Once you are discharged, your primary care physician will handle any further medical issues. Please note that NO REFILLS for any discharge medications will be authorized once you are discharged, as it is  imperative that you return to your primary care physician (or establish a relationship with a primary care physician if you do not have one) for your aftercare needs so that they can reassess your need for medications and monitor your lab values. If you do not have a primary care physician, you can call [phone] for a physician referral.   Increase activity slowly   Complete by: As directed    Pulmonary Visit   Complete by: As directed    Bronchiectasis, hypoxia   Reason for referral: Other Pulmonary         Allergies as of 03/28/2024       Reactions   Pork-derived Products Other (See Comments)   religious        Medication List     STOP taking these medications    zolpidem  5 MG tablet Commonly known as: AMBIEN        TAKE these medications    Acetaminophen  Extra Strength 500 MG Tabs Take 2 tablets (1,000 mg total) by mouth every 8 (eight) hours for 7 days.   amLODipine  10 MG tablet Commonly known as: NORVASC  Take 1 tablet (10 mg  total) by mouth daily.   Aspirin Low Dose 81 MG chewable tablet Generic drug: aspirin Chew 1 tablet (81 mg total) by mouth 2 (two) times daily.   budesonide -formoterol  80-4.5 MCG/ACT inhaler Commonly known as: SYMBICORT  Inhale 2 puffs into the lungs 2 (two) times daily. What changed:  when to take this reasons to take this   dapagliflozin  propanediol 10 MG Tabs tablet Commonly known as: Farxiga  Take 1 tablet (10 mg total) by mouth daily before breakfast.   gabapentin  300 MG capsule Commonly known as: NEURONTIN  Take 1 capsule by mouth at bedtime What changed:  when to take this reasons to take this   glimepiride  2 MG tablet Commonly known as: AMARYL  Take 1 tablet (2 mg total) by mouth daily with breakfast. TAKE ONE TABLET BY MOUTH WITH BREAKFAST   meloxicam  15 MG tablet Commonly known as: MOBIC  Take 1 tablet by mouth once daily What changed:  when to take this reasons to take this   metFORMIN  500 MG 24 hr tablet Commonly known as: GLUCOPHAGE -XR Take 1 tablet (500 mg total) by mouth 2 (two) times daily with a meal.   methocarbamol 500 MG tablet Commonly known as: ROBAXIN Take 1 tablet (500 mg total) by mouth every 6 (six) hours as needed for muscle spasms.   multivitamin tablet Take 1 tablet by mouth daily.   oxyCODONE 5 MG immediate release tablet Commonly known as: Oxy IR/ROXICODONE Take 1 tablet (5 mg total) by mouth every 4 (four) hours as needed for severe pain (pain score 7-10).   polyethylene glycol powder 17 GM/SCOOP powder Commonly known as: GLYCOLAX/MIRALAX Take 17 g by mouth daily as needed for mild constipation.   Restasis 0.05 % ophthalmic emulsion Generic drug: cycloSPORINE Place 1 drop into both eyes 2 (two) times daily as needed (Dry eye).   rosuvastatin  20 MG tablet Commonly known as: CRESTOR  Take 1 tablet (20 mg total) by mouth daily.   Senna-S 8.6-50 MG tablet Generic drug: senna-docusate Take 2 tablets by mouth 2 (two) times daily.    sitaGLIPtin  50 MG tablet Commonly known as: Januvia  Take 1 tablet (50 mg total) by mouth daily.               Durable Medical Equipment  (From admission, onward)           Start  Ordered   03/28/24 1139  For home use only DME oxygen  Once       Question Answer Comment  Length of Need 6 Months   Mode or (Route) Nasal cannula   Liters per Minute 2   Frequency Continuous (stationary and portable oxygen unit needed)   Oxygen conserving device Yes   Oxygen delivery system Gas      03/28/24 1139   03/28/24 1050  For home use only DME Walker rolling  Once       Question Answer Comment  Walker: With 5 Inch Wheels   Patient needs a walker to treat with the following condition Physical deconditioning      03/28/24 1049   03/28/24 1050  For home use only DME 3 n 1  Once        03/28/24 1049              Follow-up Information     Sagardia, Isidro Margo, MD. Schedule an appointment as soon as possible for a visit.   Specialty: Internal Medicine Contact information: [address] [place] [address] [phone]         Diedra Fowler, MD. Schedule an appointment as soon as possible for a visit in 1 week(s).   Specialty: Orthopedic Surgery Why: post hospitalization follow up Contact information: [address] [place] [address] [phone]                 TOTAL DISCHARGE TIME: 35 minutes  Shareka Casale Lyndon Santiago  [facility] Pager on [email or web address]  03/28/2024, 11:39 AM

## 2024-03-28 NOTE — Progress Notes (Signed)
 Physical Therapy Treatment Patient Details Name: Cheryl Hopkins MRN: [id number] DOB: 11/14/1950 Today's Date: 03/28/2024   History of Present Illness 73 y.o. female presents to [facility] hospital on 03/25/2024 with L hip pain after a fall. CT scan notable for L femoral neck fx and acute nondisplaced fxs of L superior and inferior pubic rami. Pt underwent L femoral neck fx screw fixation on 5/25. PMH includes HTN, HLD, DMII, bronchiectasis.    PT Comments  PT tolerates treatment well, ambulating for increased distances and transferring multiple times. Pt continues to require physical assistance for transfers due to lack of LE power and pain. Pt does desaturate with attempts to wean to room and and reports DOE, education provided on incentive spriometer use, pt with poor inspiratory volume noted at this time. PT will continue to follow and attempt to initiate stair training as the pt has a flight of steps to ascend to enter her home.    If plan is discharge home, recommend the following: A little help with walking and/or transfers;A little help with bathing/dressing/bathroom;Assistance with cooking/housework;Assist for transportation;Help with stairs or ramp for entrance   Can travel by private vehicle        Equipment Recommendations  Rolling walker (2 wheels);BSC/3in1    Recommendations for Other Services       Precautions / Restrictions Precautions Precautions: Fall Recall of Precautions/Restrictions: Intact Restrictions Weight Bearing Restrictions Per Provider Order: Yes LLE Weight Bearing Per Provider Order: Weight bearing as tolerated     Mobility  Bed Mobility Overal bed mobility: Needs Assistance Bed Mobility: Supine to Sit     Supine to sit: Min assist     General bed mobility comments: assist for LLE    Transfers Overall transfer level: Needs assistance Equipment used: Rolling walker (2 wheels) Transfers: Sit to/from Stand Sit to Stand: Min assist           General  transfer comment: verbal cues for hand placement and to facilitate anterior trunk lean    Ambulation/Gait Ambulation/Gait assistance: Contact guard assist Gait Distance (Feet): 100 Feet Assistive device: Rolling walker (2 wheels) Gait Pattern/deviations: Step-through pattern Gait velocity: reduced Gait velocity interpretation: <1.8 ft/sec, indicate of risk for recurrent falls   General Gait Details: slowed step-through gait, reduced stance time on LLE   Stairs             Wheelchair Mobility     Tilt Bed    Modified Rankin (Stroke Patients Only)       Balance Overall balance assessment: Needs assistance Sitting-balance support: No upper extremity supported Sitting balance-Leahy Scale: Fair     Standing balance support: Bilateral upper extremity supported Standing balance-Leahy Scale: Poor                              Communication Communication Communication: Other (comment) Factors Affecting Communication: Non - English speaking, interpreter not available  Cognition Arousal: Alert Behavior During Therapy: WFL for tasks assessed/performed   PT - Cognitive impairments: Difficult to assess Difficult to assess due to: Non-English speaking                     PT - Cognition Comments: niece interpreting during session, follows commands appropriately Following commands: Intact      Cueing Cueing Techniques: Verbal cues, Visual cues  Exercises Other Exercises Other Exercises: education provided on incentive spirometer use. pt performs 5 sets of 3 breaths during treatment  General Comments General comments (skin integrity, edema, etc.): pt on 2L [place] upon PT arrival, PT attempts to wean to room air when mobilizing, pt desats to 80% when mobilizing. Pt recovers on 2L [place] at end of session      Pertinent Vitals/Pain Pain Assessment Pain Assessment: Faces Faces Pain Scale: Hurts even more Pain Location: L hip Pain Descriptors /  Indicators: Grimacing Pain Intervention(s): Monitored during session    Home Living                          Prior Function            PT Goals (current goals can now be found in the care plan section) Acute Rehab PT Goals Patient Stated Goal: to return to independence Progress towards PT goals: Progressing toward goals    Frequency    Min 3X/week      PT Plan      Co-evaluation              AM-PAC PT "6 Clicks" Mobility   Outcome Measure  Help needed turning from your back to your side while in a flat bed without using bedrails?: A Little Help needed moving from lying on your back to sitting on the side of a flat bed without using bedrails?: A Little Help needed moving to and from a bed to a chair (including a wheelchair)?: A Little Help needed standing up from a chair using your arms (e.g., wheelchair or bedside chair)?: A Little Help needed to walk in hospital room?: A Little Help needed climbing 3-5 steps with a railing? : A Lot 6 Click Score: 17    End of Session Equipment Utilized During Treatment: Gait belt Activity Tolerance: Patient tolerated treatment well Patient left: in chair;with chair alarm set;with call bell/phone within reach Nurse Communication: Mobility status PT Visit Diagnosis: Other abnormalities of gait and mobility (R26.89);Muscle weakness (generalized) (M62.81);Pain Pain - Right/Left: Left Pain - part of body: Hip     Time: 0822-0900 PT Time Calculation (min) (ACUTE ONLY): 38 min  Charges:    $Gait Training: 8-22 mins $Therapeutic Exercise: 8-22 mins $Therapeutic Activity: 8-22 mins PT General Charges $$ ACUTE PT VISIT: 1 Visit                     Rexie Catena, PT, DPT Acute Rehabilitation Office [phone]    Rexie Catena 03/28/2024, 10:31 AM

## 2024-03-29 ENCOUNTER — Telehealth: Payer: Self-pay | Admitting: Emergency Medicine

## 2024-03-29 DIAGNOSIS — S72002D Fracture of unspecified part of neck of left femur, subsequent encounter for closed fracture with routine healing: Secondary | ICD-10-CM

## 2024-03-29 LAB — BASIC METABOLIC PANEL WITH GFR
Anion gap: 8 (ref 5–15)
BUN: 5 mg/dL — ABNORMAL LOW (ref 8–23)
CO2: 30 mmol/L (ref 22–32)
Calcium: 8.4 mg/dL — ABNORMAL LOW (ref 8.9–10.3)
Chloride: 98 mmol/L (ref 98–111)
Creatinine, Ser: 0.52 mg/dL (ref 0.44–1.00)
GFR, Estimated: >60 mL/min (ref >60)
Glucose, Bld: 188 mg/dL — ABNORMAL HIGH (ref 70–99)
Potassium: 3.7 mmol/L (ref 3.5–5.1)
Sodium: 136 mmol/L (ref 135–145)

## 2024-03-29 LAB — GLUCOSE, CAPILLARY
Glucose-Capillary: 192 mg/dL — ABNORMAL HIGH (ref 70–99)
Glucose-Capillary: 281 mg/dL — ABNORMAL HIGH (ref 70–99)
Glucose-Capillary: 81 mg/dL (ref 70–99)
Glucose-Capillary: 368 mg/dL — ABNORMAL HIGH (ref 70–99)

## 2024-03-29 LAB — CBC
HCT: 35.7 % — ABNORMAL LOW (ref 36.0–46.0)
Hemoglobin: 11.8 g/dL — ABNORMAL LOW (ref 12.0–15.0)
MCH: 28 pg (ref 26.0–34.0)
MCHC: 33.1 g/dL (ref 30.0–36.0)
MCV: 84.6 fL (ref 80.0–100.0)
Platelets: 223 10*3/uL (ref 150–400)
RBC: 4.22 MIL/uL (ref 3.87–5.11)
RDW: 13.4 % (ref 11.5–15.5)
WBC: 10.3 10*3/uL (ref 4.0–10.5)
nRBC: 0 % (ref 0.0–0.2)

## 2024-03-29 MED ORDER — POTASSIUM CHLORIDE CRYS ER 20 MEQ PO TBCR
40.0000 meq | EXTENDED_RELEASE_TABLET | Freq: Once | ORAL | Status: AC
  Administered 2024-03-29: 40 meq via ORAL
  Filled 2024-03-29: qty 2

## 2024-03-29 MED ORDER — FUROSEMIDE 10 MG/ML IJ SOLN
20.0000 mg | Freq: Once | INTRAMUSCULAR | Status: AC
  Administered 2024-03-29: 20 mg via INTRAVENOUS
  Filled 2024-03-29: qty 2

## 2024-03-29 NOTE — Telephone Encounter (Signed)
 Copied from CRM [id number]. Topic: Appointments - Appointment Scheduling >> Mar 29, 2024  8:57 AM Cheryl Hopkins wrote: Had surgery.. broke her leg.Aaron Aas Son wants to know if we can write letter to [place] so someone can be here to help her recover at home ( family member ) Pls call son

## 2024-03-29 NOTE — Progress Notes (Signed)
    Durable Medical Equipment  (From admission, onward)           Start     Ordered   03/29/24 1031  For home use only DME 3 n 1  Once       Comments: Bedside commode. Confine to one room   03/29/24 1030   03/28/24 1139  For home use only DME oxygen  Once       Question Answer Comment  Length of Need 6 Months   Mode or (Route) Nasal cannula   Liters per Minute 2   Frequency Continuous (stationary and portable oxygen unit needed)   Oxygen conserving device Yes   Oxygen delivery system Gas      03/28/24 1139   03/28/24 1050  For home use only DME Walker rolling  Once       Question Answer Comment  Walker: With 5 Inch Wheels   Patient needs a walker to treat with the following condition Physical deconditioning      03/28/24 1049

## 2024-03-29 NOTE — Progress Notes (Signed)
 Patient seen and examined, no changes from Dr. Bedford Bowens discharge summary yesterday -Discussed with case manager, limited assistance at home, we are attempting to find home health agency for services  Deforest Fast, MD

## 2024-03-29 NOTE — Progress Notes (Signed)
 SATURATION QUALIFICATIONS: (This note is used to comply with regulatory documentation for home oxygen)  Patient Saturations on Room Air at Rest = 85%  Patient Saturations on Room Air while Ambulating = Not assessed due to desaturation on room air at rest  Patient Saturations on 4 Liters of oxygen while Ambulating = 93%  Please briefly explain why patient needs home oxygen: To improve the patient's tolerance for household mobility.

## 2024-03-29 NOTE — Progress Notes (Signed)
 Physical Therapy Treatment Patient Details Name: Cheryl Hopkins MRN: [id number] DOB: [birth year] Today's Date: 03/29/2024   History of Present Illness 73 y.o. female presents to [facility] hospital on 03/25/2024 with L hip pain after a fall. CT scan notable for L femoral neck fx and acute nondisplaced fxs of L superior and inferior pubic rami. Pt underwent L femoral neck fx screw fixation on 5/25. PMH includes HTN, HLD, DMII, bronchiectasis.    PT Comments  Pt tolerates treatment well, ambulating for increased distances and negotiating multiple steps without physical assistance. Pt and pt's niece continue to express concern over lack of caregiver support during treatment, however PT discusses with case manager that the pt's son is planning to take off work temporarily to provide assistance while further caregiver support is arranged. Pt will benefit from PRN assistance for ADLs and stair negotiation. Pt may also need intermittent assistance if transferring from lower surfaces. Pt appears appropriate for discharge home with HHPT when medically appropriate.   If plan is discharge home, recommend the following: A little help with walking and/or transfers;A little help with bathing/dressing/bathroom;Assistance with cooking/housework;Assist for transportation;Help with stairs or ramp for entrance   Can travel by private vehicle        Equipment Recommendations  Rolling walker (2 wheels);BSC/3in1    Recommendations for Other Services       Precautions / Restrictions Precautions Precautions: Fall Restrictions Weight Bearing Restrictions Per Provider Order: Yes LLE Weight Bearing Per Provider Order: Weight bearing as tolerated     Mobility  Bed Mobility Overal bed mobility: Needs Assistance Bed Mobility: Supine to Sit     Supine to sit: Contact guard          Transfers Overall transfer level: Needs assistance Equipment used: Rolling walker (2 wheels) Transfers: Sit to/from Stand Sit to  Stand: Min assist, Contact guard assist (minA for initial transfer progressing to CGA for 3 subsequent transfers)           General transfer comment: verbal cues for hand placement and to facilitate anterior trunk lean    Ambulation/Gait Ambulation/Gait assistance: Supervision Gait Distance (Feet): 80 Feet (additional trials of 15' x 2) Assistive device: Rolling walker (2 wheels) Gait Pattern/deviations: Step-through pattern Gait velocity: reduced Gait velocity interpretation: <1.8 ft/sec, indicate of risk for recurrent falls   General Gait Details: steady step-through gait, no significanit instability noted   Stairs Stairs: Yes Stairs assistance: Contact guard assist Stair Management: One rail Left, Sideways Number of Stairs: 2 (pt ascends and then descends 2 steps for 3 consecutive trials, 6 steps in total.)     Wheelchair Mobility     Tilt Bed    Modified Rankin (Stroke Patients Only)       Balance Overall balance assessment: Needs assistance Sitting-balance support: No upper extremity supported, Feet supported Sitting balance-Leahy Scale: Good     Standing balance support: Single extremity supported, Reliant on assistive device for balance Standing balance-Leahy Scale: Poor                              Communication Communication Communication: Other (comment) Factors Affecting Communication: Non - English speaking, interpreter not available  Cognition Arousal: Alert Behavior During Therapy: WFL for tasks assessed/performed   PT - Cognitive impairments: Difficult to assess Difficult to assess due to: Non-English speaking                     PT - Cognition Comments:  niece interprets, pt responds to commands appropriately throughout session Following commands: Intact      Cueing Cueing Techniques: Verbal cues, Visual cues  Exercises      General Comments General comments (skin integrity, edema, etc.): pt on 1L [place] upon PT  arrival with oxygen saturation at 87% at rest. Pt requires 4L [place] to maintain sats at or above 92%.      Pertinent Vitals/Pain Pain Assessment Pain Assessment: 0-10 Pain Score: 9  Pain Location: L hip Pain Descriptors / Indicators: Sore Pain Intervention(s): Premedicated before session    Home Living                          Prior Function            PT Goals (current goals can now be found in the care plan section) Acute Rehab PT Goals Patient Stated Goal: to return to independence Progress towards PT goals: Progressing toward goals    Frequency    Min 3X/week      PT Plan      Co-evaluation              AM-PAC PT "6 Clicks" Mobility   Outcome Measure  Help needed turning from your back to your side while in a flat bed without using bedrails?: A Little Help needed moving from lying on your back to sitting on the side of a flat bed without using bedrails?: A Little Help needed moving to and from a bed to a chair (including a wheelchair)?: A Little Help needed standing up from a chair using your arms (e.g., wheelchair or bedside chair)?: A Little Help needed to walk in hospital room?: A Little Help needed climbing 3-5 steps with a railing? : A Little 6 Click Score: 18    End of Session Equipment Utilized During Treatment: Gait belt Activity Tolerance: Patient tolerated treatment well Patient left: in chair;with chair alarm set;with call bell/phone within reach Nurse Communication: Mobility status PT Visit Diagnosis: Other abnormalities of gait and mobility (R26.89);Muscle weakness (generalized) (M62.81);Pain Pain - Right/Left: Left Pain - part of body: Hip     Time: 7829-5621 PT Time Calculation (min) (ACUTE ONLY): 41 min  Charges:    $Gait Training: 23-37 mins $Therapeutic Activity: 8-22 mins PT General Charges $$ ACUTE PT VISIT: 1 Visit                     Rexie Catena, PT, DPT Acute Rehabilitation Office [phone]    Rexie Catena 03/29/2024, 10:47 AM

## 2024-03-29 NOTE — Inpatient Diabetes Management (Signed)
 Inpatient Diabetes Program Recommendations  AACE/ADA: New Consensus Statement on Inpatient Glycemic Control (2015)  Target Ranges:  Prepandial:   less than 140 mg/dL      Peak postprandial:   less than 180 mg/dL (1-2 hours)      Critically ill patients:  140 - 180 mg/dL   Lab Results  Component Value Date   GLUCAP 192 (H) 03/29/2024   HGBA1C 8.1 (H) 11/29/2023    Review of Glycemic Control  Latest Reference Range & Units 03/28/24 06:06 03/28/24 11:08 03/28/24 16:22 03/28/24 21:38 03/29/24 05:33  Glucose-Capillary 70 - 99 mg/dL 147 (H) 829 (H) 562 (H) 317 (H) 192 (H)   Diabetes history: DM 2 Outpatient Diabetes medications: Farxiga  10 mg Daily, Amaryl  2 mg Daily, metformin  500 mg bid, Januvia  50 mg Daily Current orders for Inpatient glycemic control:  Novolog 0-15 units tid + hs  Ensure Enlive bid between meals (40 grams of carbohydrates)  Inpatient Diabetes Program Recommendations:    -  Consider adding Novolog 2 units tid meal coverage if eating >50% of meals/supplement  Thanks,  Eloise Hake RN, MSN, BC-ADM Inpatient Diabetes Coordinator Team Pager [phone] (8a-5p)

## 2024-03-29 NOTE — Telephone Encounter (Signed)
 Provide home health care as needed please.  Call and inquire about her needs.  Thanks.

## 2024-03-29 NOTE — TOC Progression Note (Signed)
 Transition of Care [facility]) - Progression Note    Patient Details  Name: Cheryl Hopkins MRN: [id number] Date of Birth: 06/04/1951  [facility]) CM/SW Contact  Alisa App, RN Phone Number: 03/29/2024, 10:46 AM  Clinical Narrative:        - s/p ORIF L HIP, 5/25 NCM spoke with pt and pt's son regarding d/c planning. Son states he is out of town and is trying to make arrangements for pt. PTA pt lived alone. Son is a Naval architect. States will not be @ home to assist with care once d/c. Son states arrangements should be in place by tomorrow to assist with care. Orders noted for DME and home health needs. Pt/son agreeable to home health services, No provider preference. Referral made with [facility] for HHPT and OT services and accepted , [facility] Tuesday next week ( provider aware). Referral made with [facility] for DME: RW, BSC and oxygen. Equipment will be delivered to bedside prior to d/c.  Son to arrange d/c to home once d/c ready.  Pt without RX MED concerns.  TOC TEAM following and will assist with needs....     Expected Discharge Plan: Home w Home Health Services Barriers to Discharge: Continued Medical Work up  Expected Discharge Plan and Services   Discharge Planning Services: CM Consult   Living arrangements for the past 2 months: Apartment                 DME Arranged: Bedside commode, Walker rolling, Oxygen DME Agency: [employer] Date DME Agency Contacted: 03/29/24 Time DME Agency Contacted: 1034 Representative spoke with at DME Agency: Zula Hitch HH Arranged: PT, OT HH Agency: [facility] Date [facility] Agency Contacted: 03/29/24 Time HH Agency Contacted: 1006 Representative spoke with at [facility] Agency: Loetta Ringer   Social Determinants of Health (SDOH) Interventions SDOH Screenings   Food Insecurity: No Food Insecurity (03/26/2024)  Housing: Low Risk  (03/26/2024)  Transportation Needs: No Transportation Needs (03/26/2024)   Utilities: Not At Risk (03/26/2024)  Depression (PHQ2-9): Low Risk  (11/29/2023)  Social Connections: Moderately Isolated (03/26/2024)  Tobacco Use: Low Risk  (03/26/2024)    Readmission Risk Interventions     No data to display

## 2024-03-30 ENCOUNTER — Other Ambulatory Visit (HOSPITAL_COMMUNITY): Payer: Self-pay

## 2024-03-30 LAB — GLUCOSE, CAPILLARY: Glucose-Capillary: 166 mg/dL — ABNORMAL HIGH (ref 70–99)

## 2024-03-30 MED ORDER — LOPERAMIDE HCL 2 MG PO CAPS
2.0000 mg | ORAL_CAPSULE | Freq: Once | ORAL | Status: DC
  Filled 2024-03-30: qty 1

## 2024-03-30 NOTE — Plan of Care (Signed)

## 2024-03-30 NOTE — Plan of Care (Signed)
  Problem: Education: Goal: Ability to describe self-care measures that may prevent or decrease complications (Diabetes Survival Skills Education) will improve Outcome: Progressing   Problem: Skin Integrity: Goal: Risk for impaired skin integrity will decrease Outcome: Progressing   Problem: Activity: Goal: Risk for activity intolerance will decrease Outcome: Progressing   Problem: Pain Managment: Goal: General experience of comfort will improve and/or be controlled Outcome: Progressing

## 2024-03-31 ENCOUNTER — Telehealth: Payer: Self-pay | Admitting: *Deleted

## 2024-03-31 NOTE — Telephone Encounter (Signed)
 LVM for son Janan Mead to call back regarding his request, needing more information on what is needed

## 2024-03-31 NOTE — Transitions of Care (Post Inpatient/ED Visit) (Signed)
 03/31/2024  Name: Cheryl Hopkins MRN: [id number] DOB: [birth year]  Today's TOC FU Call Status: Today's TOC FU Call Status:: Successful TOC FU Call Completed TOC FU Call Complete Date: 03/31/24 Patient's Name and Date of Birth confirmed.  Transition Care Management Follow-up Telephone Call Date of Discharge: 03/30/24 Discharge Facility: [facility]) Type of Discharge: Inpatient Admission Primary Inpatient Discharge Diagnosis:: (R) hip fracture secondary to mechanical fall, surgical (R) ORIF- hip How have you been since you were released from the hospital?: Better (Per interpreter: "I am doing better, no problems; have all my medications, my son has a lady staying with me until he gets back to [facility].  He is on the road now on his job as a Naval architect, please call him to finish up and schedule the doctor appointment") Any questions or concerns?: No  Calls placed to patient initially via [place] interpreter services: required multiple call attempts to interpreter; patient limited in understanding of TOC questions/ conversation, even with use of interpreter- eventually she requests that I attempt contact with her son who is a cross country Naval architect; son speaks English, but unfortunately is working at time of call- was able to facilitate hospital follow up with PCP, however, son's focus during [facility] call is around a request he has placed with PCP office for "a letter being sent [place];" confirmed for son that from review of EHR- it appears PCP team is aware/ currently working on his request  Items Reviewed: Did you receive and understand the discharge instructions provided?: Yes (unable to review thoroughly with patient due to language barrier, even with interpreter services; son is not able to review- he is working at his job as a Naval architect and is not currently in [place]) Medications obtained,verified, and reconciled?: No Medications Not Reviewed Reasons:: Other: (patient declined;  reports "has all my medicine;" son reports in subsequent call that he can not review due to not currently being at home to "go by the list") Any new allergies since your discharge?: No Dietary orders reviewed?: Yes Type of Diet Ordered:: "As good as I can" Do you have support at home?: Yes People in Home [RPT]: child(ren), adult Name of Support/Comfort Primary Source: Per interpreter, patient reports essentially independent in self-care activities; resides with supportive son who normally assists as/ if needed/ indicated- however, he is not currently at home due to being at work as a Naval architect; patient and son both report that son has hired "a lady" who is staying with patient "24/7" until he returnes home "next week"  Medications Reviewed Today: Medications Reviewed Today     Reviewed by Dquan Cortopassi M, RN (Registered Nurse) on 03/31/24 at 1603  Med List Status: <None>   Medication Order Taking? Sig Documenting Provider Last Dose Status Informant  acetaminophen  (TYLENOL ) 500 MG tablet [id number]  Take 2 tablets (1,000 mg total) by mouth every 8 (eight) hours for 7 days. Krishnan, Gokul, MD  Active   amLODipine  (NORVASC ) 10 MG tablet [id number] No Take 1 tablet (10 mg total) by mouth daily. Elvira Hammersmith, MD Past Week Active Self, Child, Pharmacy Records  aspirin  81 MG chewable tablet [id number]  Chew 1 tablet (81 mg total) by mouth 2 (two) times daily. Krishnan, Gokul, MD  Active   budesonide -formoterol  (SYMBICORT ) 80-4.5 MCG/ACT inhaler [id number] No Inhale 2 puffs into the lungs 2 (two) times daily.  Patient taking differently: Inhale 2 puffs into the lungs 2 (two) times daily as needed (Asthma).   Sagardia,  Isidro Margo, MD Past Month Active Self, Child, Pharmacy Records  dapagliflozin  propanediol (FARXIGA ) 10 MG TABS tablet [id number] No Take 1 tablet (10 mg total) by mouth daily before breakfast. Elvira Hammersmith, MD Past Week Active Self, Child, Pharmacy Records  gabapentin   (NEURONTIN ) 300 MG capsule [id number] No Take 1 capsule by mouth at bedtime  Patient taking differently: Take 300 mg by mouth daily as needed (Pain).   Elvira Hammersmith, MD Past Week Active Self, Child, Pharmacy Records  glimepiride  (AMARYL ) 2 MG tablet [id number] No Take 1 tablet (2 mg total) by mouth daily with breakfast. TAKE ONE TABLET BY MOUTH WITH BREAKFAST Sagardia, Isidro Margo, MD Past Week Active Self, Child, Pharmacy Records  meloxicam  (MOBIC ) 15 MG tablet [id number] No Take 1 tablet by mouth once daily  Patient taking differently: Take 15 mg by mouth daily as needed for pain.   Elvira Hammersmith, MD Past Week Active Self, Child, Pharmacy Records  metFORMIN  (GLUCOPHAGE -XR) 500 MG 24 hr tablet [id number] No Take 1 tablet (500 mg total) by mouth 2 (two) times daily with a meal. Sagardia, Isidro Margo, MD Past Week Active Self, Child, Pharmacy Records  methocarbamol (ROBAXIN) 500 MG tablet [id number]  Take 1 tablet (500 mg total) by mouth every 6 (six) hours as needed for muscle spasms. Krishnan, Gokul, MD  Active   Multiple Vitamin (MULTIVITAMIN) tablet [id number] No Take 1 tablet by mouth daily. [provider] Past Week Active Self, Child, Pharmacy Records  oxyCODONE (OXY IR/ROXICODONE) 5 MG immediate release tablet [id number]  Take 1 tablet (5 mg total) by mouth every 4 (four) hours as needed for severe pain (pain score 7-10). Maylene Spear, MD  Active   polyethylene glycol powder [facility]) 17 GM/SCOOP powder [id number]  Take 17 g by mouth daily as needed for mild constipation. Maylene Spear, MD  Active   RESTASIS 0.05 % ophthalmic emulsion [id number] No Place 1 drop into both eyes 2 (two) times daily as needed (Dry eye). [provider] Past Week Active Self, Child, Pharmacy Records  rosuvastatin  (CRESTOR ) 20 MG tablet [id number] No Take 1 tablet (20 mg total) by mouth daily. Elvira Hammersmith, MD Past Week Active Self, Child, Pharmacy Records   senna-docusate (SENOKOT-S) 8.6-50 MG tablet [id number]  Take 2 tablets by mouth 2 (two) times daily. Krishnan, Gokul, MD  Active   sitaGLIPtin  (JANUVIA ) 50 MG tablet [id number] No Take 1 tablet (50 mg total) by mouth daily. Elvira Hammersmith, MD Past Week Active Self, Child, Pharmacy Records           Home Care and Equipment/Supplies: Were Home Health Services Ordered?: Yes Name of Home Health Agency:: [facility]/ OT: start of services confirmed Tuesday 04/04/24 per inpatient notes- provided [facility] phone number to patient's son, [phone]; he stated he will call home health agency around his work load to schedule visits Has Agency set up a time to come to your home?: No Any new equipment or medical supplies ordered?: Yes (home O2; BSC; Rolling Walker) Name of Medical supply agency?: [employer] Were you able to get the equipment/medical supplies?: Yes Do you have any questions related to the use of the equipment/supplies?: No  Functional Questionnaire: Do you need assistance with bathing/showering or dressing?: Yes (per patient report via interpreter- son has hired a "lady" who is staying with her "24/7" and assisting as needed) Do you need assistance with meal preparation?: Yes (per patient report via interpreter- son has hired a "lady" who is staying with her "24/7" and assisting  as needed) Do you need assistance with eating?: No Do you have difficulty maintaining continence: No Do you need assistance with getting out of bed/getting out of a chair/moving?: No Do you have difficulty managing or taking your medications?: Yes (per patient report via interpreter- son has hired a "lady" who is staying with her "24/7" and assisting as needed)  Follow up appointments reviewed: PCP Follow-up appointment confirmed?: Yes (care coordination outreach in real-time with scheduling care guide to successfully schedule hospital follow up PCP appointment 04/07/24) Date of PCP follow-up appointment?:  04/07/24 Follow-up Provider: PCP- covering provider Specialist Hospital Follow-up appointment confirmed?: Yes Date of Specialist follow-up appointment?: 04/13/24 Follow-Up Specialty Provider:: surgical provider Do you need transportation to your follow-up appointment?: No Do you understand care options if your condition(s) worsen?: Yes-patient verbalized understanding  SDOH Interventions Today    Flowsheet Row Most Recent Value  SDOH Interventions   Food Insecurity Interventions Intervention Not Indicated  Housing Interventions Intervention Not Indicated  Transportation Interventions Intervention Not Indicated  [per patient via interpreter: "my son or the lady he has staying with me provide my transportation"]  Utilities Interventions Intervention Not Indicated      Patient's son/ caregiver decline ongoing/ further care management/ coordination outreach- reports currently working with PCP staff to obtain the "letter from [employer]" he has requested- he is a Agricultural consultant and is not available for regular outreach;  patient demonstrates difficulty in communication/ understanding, even with use of interpreter services  See TOC assessment tabs for additional assessment/ TOC intervention information  Pls call/ message for questions,  Erlene Hawks, RN, BSN, Media planner  Transitions of Care  [facility] - Population Health  [place] [phone]: direct office

## 2024-04-03 ENCOUNTER — Telehealth: Payer: Self-pay | Admitting: Orthopedic Surgery

## 2024-04-03 NOTE — Telephone Encounter (Signed)
 I called and gave verbal auth

## 2024-04-03 NOTE — Telephone Encounter (Signed)
 Rochelle (PT) from [facility] called for verbal orders 3wk 2. Rochelle secure number is [phone].

## 2024-04-05 ENCOUNTER — Other Ambulatory Visit: Payer: Self-pay | Admitting: Radiology

## 2024-04-05 DIAGNOSIS — M2550 Pain in unspecified joint: Secondary | ICD-10-CM

## 2024-04-05 DIAGNOSIS — S72009S Fracture of unspecified part of neck of unspecified femur, sequela: Secondary | ICD-10-CM

## 2024-04-06 ENCOUNTER — Telehealth: Payer: Self-pay | Admitting: Emergency Medicine

## 2024-04-06 NOTE — Telephone Encounter (Signed)
 Copied from CRM [id number]. Topic: General - Other >> Apr 06, 2024  4:03 PM Zipporah Him wrote: Reason for CRM: House interpreter is available for the next hour (5PM) to inform patient that they do not have an interpreter available for her language, for her appointment tomorrow. If she has someone at home who can interpret she has that option. Interpreter information, Erby Hatcher - [phone]  Please call to contact patient, will have to call interpreter first and then get the patient on the line, 3 way call from the office. Interpreter cannot call patient. Call from [place] dispatcher for interpreter services.

## 2024-04-06 NOTE — Progress Notes (Deleted)
   Acute Office Visit  Subjective:     Patient ID: Cheryl Hopkins, female    DOB: 08/01/1951, 73 y.o.   MRN: [id number]  No chief complaint on file.   HPI Follow up Hospitalization  [facility] and discharged on 03/28/24. She was treated for L fermur fracture. Treatment for this included ORIF L hip. Telephone follow up was done on 03/31/24 She reports good compliance with treatment. She reports this condition is improved.  ----------------------------------------------------------------------------------------- - HOSPITAL COURSE:    Left hip fracture Secondary to mechanical fall. Underwent surgery on 5/25.  Aspirin  twice a day ordered for DVT prophylaxis. Seen by physical therapy.  Home health is recommended.   Diabetes mellitus type 2 HbA1c was 8.2 in January. Resume home medications   Essential hypertension Resume home medications   Hyperlipidemia On statin.   Leukocytosis Reactive.  She remains afebrile   History of bronchiectasis/hypoxia Has been seen by pulmonology previously.  Respiratory status is stable.  Chest x-ray shows chronic changes without any acute findings. Noted to be on oxygen.  It appears that she desaturated when she worked with physical therapy today.  Might need home oxygen.  This could just be due to deconditioning in the setting of her bronchiectasis.  X-ray did not show any acute findings and she does not have any dyspnea at rest. Amb referral sent to [facility].  ROS Per HPI      Objective:    LMP 11/02/2010 (Approximate) Comment: postmenopausal   Physical Exam Vitals and nursing note reviewed.  Constitutional:      General: She is not in acute distress.    Appearance: Normal appearance. She is normal weight.  HENT:     Head: Normocephalic and atraumatic.     Right Ear: External ear normal.     Left Ear: External ear normal.     Nose: Nose normal.     Mouth/Throat:     Mouth: Mucous membranes are moist.      Pharynx: Oropharynx is clear.  Eyes:     Extraocular Movements: Extraocular movements intact.     Pupils: Pupils are equal, round, and reactive to light.  Cardiovascular:     Rate and Rhythm: Normal rate and regular rhythm.     Pulses: Normal pulses.     Heart sounds: Normal heart sounds.  Pulmonary:     Effort: Pulmonary effort is normal. No respiratory distress.     Breath sounds: Normal breath sounds. No wheezing, rhonchi or rales.  Musculoskeletal:        General: Normal range of motion.     Cervical back: Normal range of motion.     Right lower leg: No edema.     Left lower leg: No edema.  Lymphadenopathy:     Cervical: No cervical adenopathy.  Neurological:     General: No focal deficit present.     Mental Status: She is alert and oriented to person, place, and time.  Psychiatric:        Mood and Affect: Mood normal.        Thought Content: Thought content normal.   No results found for any visits on 04/07/24.      Assessment & Plan:   There are no diagnoses linked to this encounter.   No orders of the defined types were placed in this encounter.   No follow-ups on file.  Wellington Half, FNP

## 2024-04-07 ENCOUNTER — Telehealth: Payer: Self-pay | Admitting: Orthopedic Surgery

## 2024-04-07 ENCOUNTER — Inpatient Hospital Stay: Admitting: Family Medicine

## 2024-04-07 DIAGNOSIS — E1169 Type 2 diabetes mellitus with other specified complication: Secondary | ICD-10-CM

## 2024-04-07 DIAGNOSIS — S72002D Fracture of unspecified part of neck of left femur, subsequent encounter for closed fracture with routine healing: Secondary | ICD-10-CM

## 2024-04-07 DIAGNOSIS — J479 Bronchiectasis, uncomplicated: Secondary | ICD-10-CM

## 2024-04-07 DIAGNOSIS — Z758 Other problems related to medical facilities and other health care: Secondary | ICD-10-CM

## 2024-04-07 NOTE — Telephone Encounter (Signed)
 Patient came. She would like a letter stating that the daughter or son needs to come and be with her for 3 months. Would like the letter sent to [place] [employer] [place]. [email or web address]

## 2024-04-10 ENCOUNTER — Encounter: Payer: Self-pay | Admitting: Radiology

## 2024-04-10 ENCOUNTER — Telehealth: Payer: Self-pay | Admitting: Orthopedic Surgery

## 2024-04-10 MED ORDER — HYDROCODONE-ACETAMINOPHEN 5-325 MG PO TABS: 1.0000 | ORAL_TABLET | ORAL | 0 refills | Status: AC | PRN

## 2024-04-10 NOTE — Addendum Note (Signed)
 Addended by: Colette Davies on: 04/10/2024 05:08 PM   Modules accepted: Orders

## 2024-04-10 NOTE — Telephone Encounter (Signed)
 Patient called and said she needs a refill on all pain medication. CB#[phone]

## 2024-04-13 ENCOUNTER — Ambulatory Visit: Admitting: Orthopedic Surgery

## 2024-04-13 ENCOUNTER — Other Ambulatory Visit (INDEPENDENT_AMBULATORY_CARE_PROVIDER_SITE_OTHER): Payer: Self-pay

## 2024-04-13 DIAGNOSIS — S72002D Fracture of unspecified part of neck of left femur, subsequent encounter for closed fracture with routine healing: Secondary | ICD-10-CM | POA: Diagnosis not present

## 2024-04-13 NOTE — Telephone Encounter (Signed)
 LVM for pt son to give a call back regarding letter needed. Encounter on 6/6 looks like letter has been taken care of

## 2024-04-13 NOTE — Progress Notes (Signed)
 Orthopedic Surgery Post-operative Office Visit  Procedure: left femoral neck fracture percutaneous pinning Date of Surgery: 03/26/2024 (~2 weeks post-op)  Assessment: Patient is a 73 y.o. who is doing well at this stage after surgery. Having minimal pain in her hip and is mobilizing with a walker   Plan: -Operative plans complete -Weightbearing as tolerated -Staples removed today in the office -Okay to let soap/water run over the incision but do not submerge -DVT ppx: ASA 81mg  BID -Continue with PT -Pain management: tylenol  as needed -Return to office in 4 weeks, x-rays needed at next visit: AP/lateral left hip  ___________________________________________________________________________   Subjective: Patient has returned to home since discharge from the hospital.  Her family is helping her at home.  She is having minimal pain in her hip.  She is only using Tylenol  at this point to control her pain.  She is ambulating with a walker.  She has been working with home health PT.  She has not noticed any redness or drainage around her incision.  Objective:  General: no acute distress, appropriate affect Neurologic: alert, answering questions appropriately, following commands Respiratory: unlabored breathing on room air Skin: incision is well-approximated with no erythema, induration, active/expressible drainage  MSK (LLE): antalgic gait, ambulating with walker, EHL/TA/GSC intact, plantar flexes and dorsiflexes toes, sensation intact light touch in sural/saphenous/deep peroneal/superficial peroneal/tibial nerve distributions, foot warm well-perfused, palpable DP pulse  Imaging: X-rays of the left hip from 04/13/2024 were independently reviewed and interpreted, showing 3 partially-threaded screws spanning the femoral neck with washers seen over the lateral cortex of the proximal femur.  No lucency seen around the screws.  None of the screws have backed out.  The fracture appears well reduced  with no interval displacement since immediate postoperative films on 03/26/2024.  No new fracture seen.   Patient name: Cheryl Hopkins Patient MRN: [id number] Date of visit: 04/13/24

## 2024-04-14 NOTE — Telephone Encounter (Deleted)
 Copied from CRM [id number]. Topic: General - Call Back - No Documentation >> Apr 14, 2024  1:21 PM Leah C wrote: Reason for CRM: Patient's son, Cheryl Hopkins, returning call in regards to letter for patient/mom to be sent to the [employer]. Patient would appreciate a call back or message to know that it was sent, and if the copy of the documents could be forwarded to him as well. He provided the embassy contact info:   Contact person: Aboubacar Siddikou Abdou  [phone] [email or web address]

## 2024-04-14 NOTE — Telephone Encounter (Signed)
 Copied from CRM [id number]. Topic: General - Call Back - No Documentation >> Apr 14, 2024  1:21 PM Leah C wrote: Reason for CRM: Patient's son, Janan Mead, returning call in regards to letter for patient/mom to be sent to the [employer]. Patient would appreciate a call back or message to know that it was sent, and if the copy of the documents could be forwarded to him as well. He provided the embassy contact info:   Contact person: Aboubacar Siddikou Abdou  [phone] [email or web address]

## 2024-04-14 NOTE — Telephone Encounter (Signed)
 Letter has been acknowledged by another office

## 2024-04-26 ENCOUNTER — Telehealth: Payer: Self-pay | Admitting: Orthopedic Surgery

## 2024-04-26 MED ORDER — ASPIRIN 81 MG PO CHEW: 81.0000 mg | CHEWABLE_TABLET | Freq: Two times a day (BID) | ORAL | 0 refills | Status: DC

## 2024-04-26 MED ORDER — TRAMADOL HCL 50 MG PO TABS: 50.0000 mg | ORAL_TABLET | Freq: Four times a day (QID) | ORAL | 0 refills | Status: DC | PRN

## 2024-04-26 NOTE — Telephone Encounter (Signed)
 Pt's son Ismel called requesting refill for his mother. Pt need refill of Asprin and pain medication. Please send to [facility]. Please call pt at [phone]

## 2024-05-11 ENCOUNTER — Telehealth: Payer: Self-pay | Admitting: Emergency Medicine

## 2024-05-11 NOTE — Telephone Encounter (Signed)
 Who picks it up?

## 2024-05-11 NOTE — Telephone Encounter (Signed)
 Copied from CRM [id number]. Topic: Clinical - Order For Equipment >> May 11, 2024  9:20 AM Ernestene SQUIBB wrote: Reason for CRM: Pt son advise the oxygen machine need to be picked up as she does not need it [phone]

## 2024-05-15 ENCOUNTER — Ambulatory Visit (INDEPENDENT_AMBULATORY_CARE_PROVIDER_SITE_OTHER): Admitting: Emergency Medicine

## 2024-05-15 ENCOUNTER — Ambulatory Visit: Payer: Self-pay | Admitting: Emergency Medicine

## 2024-05-15 ENCOUNTER — Encounter: Payer: Self-pay | Admitting: Emergency Medicine

## 2024-05-15 VITALS — BP 126/64 | HR 77 | Temp 98.1°F | Ht 68.0 in | Wt 159.0 lb

## 2024-05-15 DIAGNOSIS — I152 Hypertension secondary to endocrine disorders: Secondary | ICD-10-CM | POA: Diagnosis not present

## 2024-05-15 DIAGNOSIS — J479 Bronchiectasis, uncomplicated: Secondary | ICD-10-CM | POA: Diagnosis not present

## 2024-05-15 DIAGNOSIS — E785 Hyperlipidemia, unspecified: Secondary | ICD-10-CM | POA: Diagnosis not present

## 2024-05-15 DIAGNOSIS — Z7984 Long term (current) use of oral hypoglycemic drugs: Secondary | ICD-10-CM

## 2024-05-15 DIAGNOSIS — E1169 Type 2 diabetes mellitus with other specified complication: Secondary | ICD-10-CM | POA: Diagnosis not present

## 2024-05-15 DIAGNOSIS — E1159 Type 2 diabetes mellitus with other circulatory complications: Secondary | ICD-10-CM

## 2024-05-15 DIAGNOSIS — J984 Other disorders of lung: Secondary | ICD-10-CM

## 2024-05-15 DIAGNOSIS — M2550 Pain in unspecified joint: Secondary | ICD-10-CM | POA: Diagnosis not present

## 2024-05-15 LAB — COMPREHENSIVE METABOLIC PANEL WITH GFR
ALT: 12 U/L (ref 0–35)
AST: 15 U/L (ref 0–37)
Albumin: 4.4 g/dL (ref 3.5–5.2)
Alkaline Phosphatase: 97 U/L (ref 39–117)
BUN: 10 mg/dL (ref 6–23)
CO2: 32 meq/L (ref 19–32)
Calcium: 9.6 mg/dL (ref 8.4–10.5)
Chloride: 100 meq/L (ref 96–112)
Creatinine, Ser: 0.63 mg/dL (ref 0.40–1.20)
GFR: 89.2 mL/min (ref >60.00)
Glucose, Bld: 137 mg/dL — ABNORMAL HIGH (ref 70–99)
Potassium: 3.8 meq/L (ref 3.5–5.1)
Sodium: 141 meq/L (ref 135–145)
Total Bilirubin: 0.3 mg/dL (ref 0.2–1.2)
Total Protein: 8.2 g/dL (ref 6.0–8.3)

## 2024-05-15 LAB — MICROALBUMIN / CREATININE URINE RATIO
Creatinine,U: 81.9 mg/dL
Microalb Creat Ratio: 34.6 mg/g — ABNORMAL HIGH (ref 0.0–30.0)
Microalb, Ur: 2.8 mg/dL — ABNORMAL HIGH (ref 0.0–1.9)

## 2024-05-15 LAB — CBC WITH DIFFERENTIAL/PLATELET
Basophils Absolute: 0.1 K/uL (ref 0.0–0.1)
Basophils Relative: 1.1 % (ref 0.0–3.0)
Eosinophils Absolute: 0.5 K/uL (ref 0.0–0.7)
Eosinophils Relative: 7.1 % — ABNORMAL HIGH (ref 0.0–5.0)
HCT: 44.1 % (ref 36.0–46.0)
Hemoglobin: 14.4 g/dL (ref 12.0–15.0)
Lymphocytes Relative: 35.5 % (ref 12.0–46.0)
Lymphs Abs: 2.3 K/uL (ref 0.7–4.0)
MCHC: 32.7 g/dL (ref 30.0–36.0)
MCV: 85.1 fl (ref 78.0–100.0)
Monocytes Absolute: 0.5 K/uL (ref 0.1–1.0)
Monocytes Relative: 8.1 % (ref 3.0–12.0)
Neutro Abs: 3.2 K/uL (ref 1.4–7.7)
Neutrophils Relative %: 48.2 % (ref 43.0–77.0)
Platelets: 364 K/uL (ref 150.0–400.0)
RBC: 5.19 Mil/uL — ABNORMAL HIGH (ref 3.87–5.11)
RDW: 14.7 % (ref 11.5–15.5)
WBC: 6.5 K/uL (ref 4.0–10.5)

## 2024-05-15 LAB — LIPID PANEL
Cholesterol: 163 mg/dL (ref 0–200)
HDL: 53.7 mg/dL (ref >39.00)
LDL Cholesterol: 95 mg/dL (ref 0–99)
NonHDL: 109.6
Total CHOL/HDL Ratio: 3
Triglycerides: 73 mg/dL (ref 0.0–149.0)
VLDL: 14.6 mg/dL (ref 0.0–40.0)

## 2024-05-15 LAB — HEMOGLOBIN A1C: Hgb A1c MFr Bld: 7.4 % — ABNORMAL HIGH (ref 4.6–6.5)

## 2024-05-15 MED ORDER — GLIMEPIRIDE 2 MG PO TABS: 2.0000 mg | ORAL_TABLET | Freq: Every day | ORAL | 1 refills | Status: DC

## 2024-05-15 MED ORDER — MELOXICAM 15 MG PO TABS: 15.0000 mg | ORAL_TABLET | Freq: Every day | ORAL | 1 refills | Status: DC | PRN

## 2024-05-15 MED ORDER — SITAGLIPTIN PHOSPHATE 50 MG PO TABS: 50.0000 mg | ORAL_TABLET | Freq: Every day | ORAL | 1 refills | Status: DC

## 2024-05-15 NOTE — Assessment & Plan Note (Signed)
 Chronic stable condition

## 2024-05-15 NOTE — Assessment & Plan Note (Signed)
 Chronic stable conditions Continue Januvia, metformin, and glimepiride Continue rosuvastatin 20 mg daily Lipid profile done today Diet and nutrition discussed

## 2024-05-15 NOTE — Assessment & Plan Note (Signed)
 BP Readings from Last 3 Encounters:  05/15/24 126/64  03/30/24 (!) 133/55  03/17/24 (!) 145/73  Well-controlled hypertension Continue amlodipine  10 mg daily Will do hemoglobin A1c today Continues glimepiride  2 mg daily, metformin  500 mg twice a day and Januvia  50 mg daily Diet and nutrition discussed Blood work done today Follow-up in 6 months

## 2024-05-15 NOTE — Patient Instructions (Signed)
 Health Maintenance After Age 71 After age 4, you are at a higher risk for certain long-term diseases and infections as well as injuries from falls. Falls are a major cause of broken bones and head injuries in people who are older than age 47. Getting regular preventive care can help to keep you healthy and well. Preventive care includes getting regular testing and making lifestyle changes as recommended by your health care provider. Talk with your health care provider about: Which screenings and tests you should have. A screening is a test that checks for a disease when you have no symptoms. A diet and exercise plan that is right for you. What should I know about screenings and tests to prevent falls? Screening and testing are the best ways to find a health problem early. Early diagnosis and treatment give you the best chance of managing medical conditions that are common after age 37. Certain conditions and lifestyle choices may make you more likely to have a fall. Your health care provider may recommend: Regular vision checks. Poor vision and conditions such as cataracts can make you more likely to have a fall. If you wear glasses, make sure to get your prescription updated if your vision changes. Medicine review. Work with your health care provider to regularly review all of the medicines you are taking, including over-the-counter medicines. Ask your health care provider about any side effects that may make you more likely to have a fall. Tell your health care provider if any medicines that you take make you feel dizzy or sleepy. Strength and balance checks. Your health care provider may recommend certain tests to check your strength and balance while standing, walking, or changing positions. Foot health exam. Foot pain and numbness, as well as not wearing proper footwear, can make you more likely to have a fall. Screenings, including: Osteoporosis screening. Osteoporosis is a condition that causes  the bones to get weaker and break more easily. Blood pressure screening. Blood pressure changes and medicines to control blood pressure can make you feel dizzy. Depression screening. You may be more likely to have a fall if you have a fear of falling, feel depressed, or feel unable to do activities that you used to do. Alcohol use screening. Using too much alcohol can affect your balance and may make you more likely to have a fall. Follow these instructions at home: Lifestyle Do not drink alcohol if: Your health care provider tells you not to drink. If you drink alcohol: Limit how much you have to: 0-1 drink a day for women. 0-2 drinks a day for men. Know how much alcohol is in your drink. In the [place] one drink equals one 12 oz bottle of beer (355 mL), one 5 oz glass of wine (148 mL), or one 1 oz glass of hard liquor (44 mL). Do not use any products that contain nicotine or tobacco. These products include cigarettes, chewing tobacco, and vaping devices, such as e-cigarettes. If you need help quitting, ask your health care provider. Activity  Follow a regular exercise program to stay fit. This will help you maintain your balance. Ask your health care provider what types of exercise are appropriate for you. If you need a cane or walker, use it as recommended by your health care provider. Wear supportive shoes that have nonskid soles. Safety  Remove any tripping hazards, such as rugs, cords, and clutter. Install safety equipment such as grab bars in bathrooms and safety rails on stairs. Keep rooms and walkways  well-lit. General instructions Talk with your health care provider about your risks for falling. Tell your health care provider if: You fall. Be sure to tell your health care provider about all falls, even ones that seem minor. You feel dizzy, tiredness (fatigue), or off-balance. Take over-the-counter and prescription medicines only as told by your health care provider. These include  supplements. Eat a healthy diet and maintain a healthy weight. A healthy diet includes low-fat dairy products, low-fat (lean) meats, and fiber from whole grains, beans, and lots of fruits and vegetables. Stay current with your vaccines. Schedule regular health, dental, and eye exams. Summary Having a healthy lifestyle and getting preventive care can help to protect your health and wellness after age 11. Screening and testing are the best way to find a health problem early and help you avoid having a fall. Early diagnosis and treatment give you the best chance for managing medical conditions that are more common for people who are older than age 28. Falls are a major cause of broken bones and head injuries in people who are older than age 48. Take precautions to prevent a fall at home. Work with your health care provider to learn what changes you can make to improve your health and wellness and to prevent falls. This information is not intended to replace advice given to you by your health care provider. Make sure you discuss any questions you have with your health care provider. Document Revised: 03/10/2021 Document Reviewed: 03/10/2021 Elsevier Patient Education  2024 [employer].

## 2024-05-15 NOTE — Progress Notes (Signed)
 Cheryl Hopkins 73 y.o.   Chief Complaint  Patient presents with   Follow-up    Patient here for 6 month f/u HTN/ DM with niece to interpret. No other concern     HISTORY OF PRESENT ILLNESS: This is a 73 y.o. female here for follow-up of chronic medical conditions including hypertension and diabetes. Accompanied by niece who is helping with translation. Patient doing well. Sustained left femur fracture last May.  Scheduled to follow-up with orthopedist in 2 days No other complaints or medical concerns today. BP Readings from Last 3 Encounters:  03/30/24 (!) 133/55  03/17/24 (!) 145/73  11/29/23 128/64   Lab Results  Component Value Date   HGBA1C 8.1 (H) 11/29/2023     HPI   Prior to Admission medications   Medication Sig Start Date End Date Taking? Authorizing Provider  amLODipine  (NORVASC ) 10 MG tablet Take 1 tablet (10 mg total) by mouth daily. 08/31/23 08/25/24 Yes Maxwel Meadowcroft, Emil Schanz, MD  budesonide -formoterol  (SYMBICORT ) 80-4.5 MCG/ACT inhaler Inhale 2 puffs into the lungs 2 (two) times daily. 06/25/22  Yes Jude Linck, Emil Schanz, MD  gabapentin  (NEURONTIN ) 300 MG capsule Take 1 capsule by mouth at bedtime 05/29/23  Yes Indiyah Paone Jose, MD  glimepiride  (AMARYL ) 2 MG tablet Take 1 tablet (2 mg total) by mouth daily with breakfast. TAKE ONE TABLET BY MOUTH WITH BREAKFAST 08/31/23 08/25/24 Yes Lavern Maslow, Emil Schanz, MD  metFORMIN  (GLUCOPHAGE -XR) 500 MG 24 hr tablet Take 1 tablet (500 mg total) by mouth 2 (two) times daily with a meal. 08/31/23 08/25/24 Yes Jamoni Broadfoot, Emil Schanz, MD  polyethylene glycol powder (GLYCOLAX /MIRALAX ) 17 GM/SCOOP powder Take 17 g by mouth daily as needed for mild constipation. 03/28/24  Yes Verdene Purchase, MD  RESTASIS 0.05 % ophthalmic emulsion Place 1 drop into both eyes 2 (two) times daily as needed (Dry eye). 03/04/24  Yes [provider]  rosuvastatin  (CRESTOR ) 20 MG tablet Take 1 tablet (20 mg total) by mouth daily. 08/31/23 08/25/24  Yes Nole Robey, Emil Schanz, MD  senna-docusate (SENOKOT-S) 8.6-50 MG tablet Take 2 tablets by mouth 2 (two) times daily. 03/28/24  Yes Krishnan, Gokul, MD  sitaGLIPtin  (JANUVIA ) 50 MG tablet Take 1 tablet (50 mg total) by mouth daily. 08/31/23 08/25/24 Yes Betina Puckett, Emil Schanz, MD  dapagliflozin  propanediol (FARXIGA ) 10 MG TABS tablet Take 1 tablet (10 mg total) by mouth daily before breakfast. Patient not taking: Reported on 05/15/2024 11/29/23   Purcell Emil Schanz, MD    Allergies  Allergen Reactions   Pork-Derived Products Other (See Comments)    religious    Patient Active Problem List   Diagnosis Date Noted   Diabetes mellitus without complication (HCC)    Arthralgia of multiple joints 08/31/2023   Paresthesia of both lower extremities 05/20/2021   History of simple renal cyst 04/23/2021   Bronchiectasis without complication (HCC) 08/14/2020   Language barrier 08/14/2020   Mixed conductive and sensorineural hearing loss of left ear with restricted hearing of right ear 06/19/2020   Solitary pulmonary nodule 03/05/2020   Chronic lung disease 10/31/2015   Hypertension associated with diabetes (HCC) 10/31/2015   Dyslipidemia associated with type 2 diabetes mellitus (HCC) 09/30/2015   Hyperlipidemia 09/30/2015    Past Medical History:  Diagnosis Date   Arthritis    knee   Bronchitis    Diabetes mellitus without complication (HCC)    Several years.   Dyspnea    GERD (gastroesophageal reflux disease)    Hyperlipidemia 09/30/2015   Hypertension    Stomach pain  with diarrhea    Past Surgical History:  Procedure Laterality Date   COLONOSCOPY     ORIF HIP FRACTURE Left 03/26/2024   Procedure: OPEN REDUCTION INTERNAL FIXATION HIP;  Surgeon: Georgina Ozell LABOR, MD;  Location: [facility] OR;  Service: Orthopedics;  Laterality: Left;  HIP PINNING    Social History   Socioeconomic History   Marital status: Widowed    Spouse name: Widowed since 2010   Number of children: 9    Years of education: <8   Highest education level: Not on file  Occupational History   Occupation: Housewife  Tobacco Use   Smoking status: Never   Smokeless tobacco: Never   Tobacco comments:    Chews cola nut  Vaping Use   Vaping status: Never Used  Substance and Sexual Activity   Alcohol use: No    Alcohol/week: 0.0 standard drinks of alcohol   Drug use: No   Sexual activity: Not Currently  Other Topics Concern   Not on file  Social History Narrative      Widow   Originally from [place] - Came to [employer]. In 2016   Lives with son and his wife and their 3 children.        Exercise - no exercise      Seatbelt - 100%   Gun in home - no   Social Drivers of Corporate investment banker Strain: Not on file  Food Insecurity: No Food Insecurity (03/31/2024)   Hunger Vital Sign    Worried About Running Out of Food in the Last Year: Never true    Ran Out of Food in the Last Year: Never true  Transportation Needs: No Transportation Needs (03/31/2024)   PRAPARE - Administrator, Civil Service (Medical): No    Lack of Transportation (Non-Medical): No  Physical Activity: Not on file  Stress: Not on file  Social Connections: Moderately Isolated (03/26/2024)   Social Connection and Isolation Panel    Frequency of Communication with Friends and Family: Three times a week    Frequency of Social Gatherings with Friends and Family: Three times a week    Attends Religious Services: Never    Active Member of Clubs or Organizations: No    Attends Banker Meetings: Never    Marital Status: Married  Catering manager Violence: Not At Risk (03/31/2024)   Humiliation, Afraid, Rape, and Kick questionnaire    Fear of Current or Ex-Partner: No    Emotionally Abused: No    Physically Abused: No    Sexually Abused: No    Family History  Problem Relation Age of Onset   Bronchitis Sister    Hypertension Sister    Stroke Sister    Hepatitis Daughter    Colon cancer Neg Hx     Esophageal cancer Neg Hx    Rectal cancer Neg Hx    Stomach cancer Neg Hx      Review of Systems  Constitutional: Negative.  Negative for chills and fever.  HENT: Negative.  Negative for congestion and sore throat.   Respiratory: Negative.  Negative for cough and shortness of breath.   Cardiovascular: Negative.  Negative for chest pain.  Gastrointestinal:  Negative for abdominal pain, diarrhea, nausea and vomiting.  Genitourinary: Negative.  Negative for dysuria and hematuria.  Skin: Negative.  Negative for rash.  Neurological: Negative.  Negative for dizziness and headaches.  All other systems reviewed and are negative.   Today's Vitals   05/15/24 0830  BP: 126/64  Pulse: 77  Temp: 98.1 F (36.7 C)  TempSrc: Oral  SpO2: (!) 89%  Weight: 159 lb (72.1 kg)  Height: 5' 8 (1.727 m)   Body mass index is 24.18 kg/m.   Physical Exam Vitals reviewed.  Constitutional:      Appearance: Normal appearance.  HENT:     Head: Normocephalic.     Mouth/Throat:     Mouth: Mucous membranes are moist.     Pharynx: Oropharynx is clear.  Eyes:     Extraocular Movements: Extraocular movements intact.     Pupils: Pupils are equal, round, and reactive to light.  Cardiovascular:     Rate and Rhythm: Normal rate and regular rhythm.     Pulses: Normal pulses.     Heart sounds: Normal heart sounds.  Pulmonary:     Effort: Pulmonary effort is normal.     Breath sounds: Rales (Coarse dry bilateral crackles) present.  Musculoskeletal:     Cervical back: No tenderness.     Right lower leg: No edema.     Left lower leg: No edema.  Lymphadenopathy:     Cervical: No cervical adenopathy.  Skin:    General: Skin is warm and dry.  Neurological:     General: No focal deficit present.     Mental Status: She is alert and oriented to person, place, and time.  Psychiatric:        Mood and Affect: Mood normal.        Behavior: Behavior normal.      ASSESSMENT & PLAN: A total of 42  minutes was spent with the patient and counseling/coordination of care regarding preparing for this visit, review of most recent office visit notes, review of multiple chronic medical conditions and their management, review of all medications, review of most recent bloodwork results, review of health maintenance items, education on nutrition, prognosis, documentation, and need for follow up.   Problem List Items Addressed This Visit       Cardiovascular and Mediastinum   Hypertension associated with diabetes (HCC) - Primary   BP Readings from Last 3 Encounters:  05/15/24 126/64  03/30/24 (!) 133/55  03/17/24 (!) 145/73  Well-controlled hypertension Continue amlodipine  10 mg daily Will do hemoglobin A1c today Continues glimepiride  2 mg daily, metformin  500 mg twice a day and Januvia  50 mg daily Diet and nutrition discussed Blood work done today Follow-up in 6 months       Relevant Medications   glimepiride  (AMARYL ) 2 MG tablet   sitaGLIPtin  (JANUVIA ) 50 MG tablet   Other Relevant Orders   Comprehensive metabolic panel with GFR   CBC with Differential/Platelet   Hemoglobin A1c   Lipid panel   Microalbumin / creatinine urine ratio     Respiratory   Chronic lung disease   Chronic stable condition      Bronchiectasis without complication (HCC)   Clinically stable.  Some dyspnea on exertion No signs of pneumonia No concerns identified today        Endocrine   Dyslipidemia associated with type 2 diabetes mellitus (HCC)   Chronic stable conditions Continue Januvia , metformin , and glimepiride  Continue rosuvastatin  20 mg daily Lipid profile done today Diet and nutrition discussed        Relevant Medications   glimepiride  (AMARYL ) 2 MG tablet   sitaGLIPtin  (JANUVIA ) 50 MG tablet   Other Relevant Orders   Comprehensive metabolic panel with GFR   CBC with Differential/Platelet   Hemoglobin A1c   Lipid panel  Microalbumin / creatinine urine ratio     Other    Arthralgia of multiple joints   Chronic stable condition Meloxicam  helps Recommend meloxicam  15 mg as needed      Relevant Medications   meloxicam  (MOBIC ) 15 MG tablet   Patient Instructions  Health Maintenance After Age 64 After age 69, you are at a higher risk for certain long-term diseases and infections as well as injuries from falls. Falls are a major cause of broken bones and head injuries in people who are older than age 30. Getting regular preventive care can help to keep you healthy and well. Preventive care includes getting regular testing and making lifestyle changes as recommended by your health care provider. Talk with your health care provider about: Which screenings and tests you should have. A screening is a test that checks for a disease when you have no symptoms. A diet and exercise plan that is right for you. What should I know about screenings and tests to prevent falls? Screening and testing are the best ways to find a health problem early. Early diagnosis and treatment give you the best chance of managing medical conditions that are common after age 59. Certain conditions and lifestyle choices may make you more likely to have a fall. Your health care provider may recommend: Regular vision checks. Poor vision and conditions such as cataracts can make you more likely to have a fall. If you wear glasses, make sure to get your prescription updated if your vision changes. Medicine review. Work with your health care provider to regularly review all of the medicines you are taking, including over-the-counter medicines. Ask your health care provider about any side effects that may make you more likely to have a fall. Tell your health care provider if any medicines that you take make you feel dizzy or sleepy. Strength and balance checks. Your health care provider may recommend certain tests to check your strength and balance while standing, walking, or changing positions. Foot health  exam. Foot pain and numbness, as well as not wearing proper footwear, can make you more likely to have a fall. Screenings, including: Osteoporosis screening. Osteoporosis is a condition that causes the bones to get weaker and break more easily. Blood pressure screening. Blood pressure changes and medicines to control blood pressure can make you feel dizzy. Depression screening. You may be more likely to have a fall if you have a fear of falling, feel depressed, or feel unable to do activities that you used to do. Alcohol use screening. Using too much alcohol can affect your balance and may make you more likely to have a fall. Follow these instructions at home: Lifestyle Do not drink alcohol if: Your health care provider tells you not to drink. If you drink alcohol: Limit how much you have to: 0-1 drink a day for women. 0-2 drinks a day for men. Know how much alcohol is in your drink. In the [place] one drink equals one 12 oz bottle of beer (355 mL), one 5 oz glass of wine (148 mL), or one 1 oz glass of hard liquor (44 mL). Do not use any products that contain nicotine or tobacco. These products include cigarettes, chewing tobacco, and vaping devices, such as e-cigarettes. If you need help quitting, ask your health care provider. Activity  Follow a regular exercise program to stay fit. This will help you maintain your balance. Ask your health care provider what types of exercise are appropriate for you. If you need a  cane or walker, use it as recommended by your health care provider. Wear supportive shoes that have nonskid soles. Safety  Remove any tripping hazards, such as rugs, cords, and clutter. Install safety equipment such as grab bars in bathrooms and safety rails on stairs. Keep rooms and walkways well-lit. General instructions Talk with your health care provider about your risks for falling. Tell your health care provider if: You fall. Be sure to tell your health care provider  about all falls, even ones that seem minor. You feel dizzy, tiredness (fatigue), or off-balance. Take over-the-counter and prescription medicines only as told by your health care provider. These include supplements. Eat a healthy diet and maintain a healthy weight. A healthy diet includes low-fat dairy products, low-fat (lean) meats, and fiber from whole grains, beans, and lots of fruits and vegetables. Stay current with your vaccines. Schedule regular health, dental, and eye exams. Summary Having a healthy lifestyle and getting preventive care can help to protect your health and wellness after age 69. Screening and testing are the best way to find a health problem early and help you avoid having a fall. Early diagnosis and treatment give you the best chance for managing medical conditions that are more common for people who are older than age 66. Falls are a major cause of broken bones and head injuries in people who are older than age 78. Take precautions to prevent a fall at home. Work with your health care provider to learn what changes you can make to improve your health and wellness and to prevent falls. This information is not intended to replace advice given to you by your health care provider. Make sure you discuss any questions you have with your health care provider. Document Revised: 03/10/2021 Document Reviewed: 03/10/2021 Elsevier Patient Education  2024 Elsevier Inc.     Emil Schaumann, MD [facility]

## 2024-05-15 NOTE — Assessment & Plan Note (Signed)
 Chronic stable condition Meloxicam helps Recommend meloxicam 15 mg as needed

## 2024-05-15 NOTE — Assessment & Plan Note (Signed)
 Clinically stable.  Some dyspnea on exertion No signs of pneumonia No concerns identified today

## 2024-05-17 ENCOUNTER — Ambulatory Visit: Admitting: Orthopedic Surgery

## 2024-05-17 ENCOUNTER — Other Ambulatory Visit (INDEPENDENT_AMBULATORY_CARE_PROVIDER_SITE_OTHER): Payer: Self-pay

## 2024-05-17 DIAGNOSIS — S72002D Fracture of unspecified part of neck of left femur, subsequent encounter for closed fracture with routine healing: Secondary | ICD-10-CM | POA: Diagnosis not present

## 2024-05-17 NOTE — Progress Notes (Addendum)
 Orthopedic Surgery Post-operative Office Visit   Procedure: left femoral neck fracture percutaneous pinning Date of Surgery: 03/26/2024 (~6 weeks post-op)   Assessment: Patient is a 73 y.o. who is doing well. Not having any pain in her hip. Ambulating with a cane     Plan: -Operative plans complete -Weightbearing as tolerated -Okay to submerge wound at this point -DVT ppx: none -Pain management: tylenol  as needed -Referred her to our osteoporosis clinic given her femoral neck fracture after fall from standing height  -Return to office in 6 weeks, x-rays needed at next visit: AP/lateral left hip   ___________________________________________________________________________     Subjective: Patient has been doing well since she was last seen in the office.  Her pain has improved significantly.  She is not having any pain in her hip.  She is not taking any medication for pain.  She is ambulating with a cane.   Objective:   General: no acute distress, appropriate affect Neurologic: alert, answering questions appropriately, following commands Respiratory: unlabored breathing on room air Skin: incision is well healed with no erythema, induration, active/expressible drainage   MSK (LLE): nonantalgic gait, ambulating with cane, EHL/TA/GSC intact, plantarflexes and dorsiflexes toes, sensation intact light touch in sural/saphenous/deep peroneal/superficial peroneal/tibial nerve distributions, foot warm well-perfused, palpable DP pulse   Imaging: X-rays of the left hip from 05/17/2024 were independently reviewed and interpreted, showing nondisplaced left femoral neck fracture.  There are 3 partially-threaded screws spanning the femoral neck.  No lucency seen around the screws.  The screws backed out.  No interval displacement or change in alignment of her fracture.  No new fracture seen.     Patient name: Cheryl Hopkins Patient MRN: [id number] Date of visit: 05/17/24

## 2024-05-20 ENCOUNTER — Other Ambulatory Visit: Payer: Self-pay | Admitting: Orthopedic Surgery

## 2024-06-11 ENCOUNTER — Other Ambulatory Visit: Payer: Self-pay | Admitting: Orthopedic Surgery

## 2024-06-12 ENCOUNTER — Ambulatory Visit: Admitting: Orthopedic Surgery

## 2024-06-26 ENCOUNTER — Ambulatory Visit: Payer: Medicaid Other | Admitting: Emergency Medicine

## 2024-06-28 ENCOUNTER — Telehealth: Payer: Self-pay

## 2024-06-28 ENCOUNTER — Encounter (HOSPITAL_BASED_OUTPATIENT_CLINIC_OR_DEPARTMENT_OTHER): Payer: Self-pay | Admitting: Physician Assistant

## 2024-06-28 ENCOUNTER — Ambulatory Visit (INDEPENDENT_AMBULATORY_CARE_PROVIDER_SITE_OTHER): Admitting: Physician Assistant

## 2024-06-28 ENCOUNTER — Ambulatory Visit (INDEPENDENT_AMBULATORY_CARE_PROVIDER_SITE_OTHER): Admitting: Orthopedic Surgery

## 2024-06-28 ENCOUNTER — Encounter: Admitting: Physician Assistant

## 2024-06-28 ENCOUNTER — Ambulatory Visit (INDEPENDENT_AMBULATORY_CARE_PROVIDER_SITE_OTHER): Payer: Self-pay

## 2024-06-28 DIAGNOSIS — S72002D Fracture of unspecified part of neck of left femur, subsequent encounter for closed fracture with routine healing: Secondary | ICD-10-CM

## 2024-06-28 DIAGNOSIS — M810 Age-related osteoporosis without current pathological fracture: Secondary | ICD-10-CM

## 2024-06-28 MED ORDER — TRAMADOL HCL 50 MG PO TABS: 50.0000 mg | ORAL_TABLET | Freq: Four times a day (QID) | ORAL | 0 refills | Status: AC | PRN

## 2024-06-28 NOTE — Progress Notes (Signed)
 Orthopedic Surgery Post-operative Office Visit   Procedure: left femoral neck fracture percutaneous pinning Date of Surgery: 03/26/2024 (~3 months post-op)   Assessment: Patient is a 73 y.o. who is doing well. Not having any pain in her hip. Ambulating with a cane     Plan: -Operative plans complete, activity as tolerated -Weightbearing as tolerated -DVT ppx: none -Pain management: tylenol  as needed -Return to office in 3 months, x-rays needed at next visit: AP/lateral left hip   ___________________________________________________________________________     Subjective: Patient is doing well.  She has not noticed any hip pain except sometimes when she goes from sitting to standing.  When she is walking, she does not have any hip pain.  She is ambulating with a cane.  She does not limit any of her activities as a result of pain.  She is pleased with how she is doing so far.   Objective:   General: no acute distress, appropriate affect Neurologic: alert, answering questions appropriately, following commands Respiratory: unlabored breathing on room air Skin: incision is well healed   MSK (LLE): nonantalgic gait, ambulating with cane, no pain through passive range of motion at the hip, negative Stinchfield, EHL/TA/GSC intact, plantarflexes and dorsiflexes toes, sensation intact light touch in sural/saphenous/deep peroneal/superficial peroneal/tibial nerve distributions, foot warm well-perfused, palpable DP pulse   Imaging: XRs of the left hip from 06/28/2024 were independently reviewed and interpreted, showing nondisplaced left femoral neck fracture.  No change in alignment of the femoral neck since last films on 05/17/2024.  There are 3 partially-threaded screws across the femoral neck into the femoral head.  No lucency seen around the screws.  None of the screws are backed out.     Patient name: Cheryl Hopkins Patient MRN: [id number] Date of visit: 06/28/24

## 2024-06-28 NOTE — Progress Notes (Unsigned)
 dexa

## 2024-06-29 NOTE — Progress Notes (Signed)
 Office Visit Note   Patient: Cheryl Hopkins           Date of Birth: [birth year]           MRN: [id number] Visit Date: 06/28/2024              Requested by: Georgina Ozell LABOR, MD [address] [place],  [place] [address] PCP: Purcell Emil Schanz, MD   Assessment & Plan: Visit Diagnoses:  1. Osteoporosis, unspecified osteoporosis type, unspecified pathological fracture presence     Plan: Patient is a 73 year old woman referred by Dr. Georgina.  He is treating her status post left femoral neck fracture.  She is here with a family member who interprets for us  today.  Evaluation for osteoporosis.  She does not currently take any medications for osteoporosis.  She has current left hip fracture.  Healing nicely.  No history of cardiac disease or stroke.  No history of cancer.  She does not have a history of kidney disease.  No history of ulcers or gastric bypass surgery.  No history of reflux.  She thinks she went through menopause when she was in her 92s no hormone replacement therapy that I can ascertain.  She does not take calcium  or vitamin D.  She has never been a smoker.  She is unsure of her family history has not had any major dental work.  By definition she is osteoporotic however she has had no baseline bone density scan so I am ordering this today.  Will also obtain thyroid and parathyroid studies as well as a vitamin D level.  She does have a calcium  drawn recently which is normal.  I spent 45 minutes reviewing her chart talking to her about plan of treatment.  We talked about lifestyle changes as well as possible medication treatment.  Will follow-up once her labs and her bone density scan is complete.  She will be traveling out of the country in a week or so for about 3 months can follow-up after that  Follow-Up Instructions: After bone density scan  Orders:  Orders Placed This Encounter  Procedures   TSH   Parathyroid hormone, intact (no Ca)   Vitamin D 1,25 dihydroxy   No orders of  the defined types were placed in this encounter.     Procedures: No procedures performed   Clinical Data: No additional findings.   Subjective: No chief complaint on file.   HPI pleasant 73 year old woman comes in today with the help of an interpreter referred by Dr. Georgina who has been treating her for left femoral neck fracture.  To discuss osteoporosis treatment  Review of Systems  All other systems reviewed and are negative.    Objective: Vital Signs: LMP 11/02/2010 (Approximate) Comment: postmenopausal  Physical Exam Constitutional:      Appearance: Normal appearance.  Pulmonary:     Effort: Pulmonary effort is normal.     Breath sounds: Normal breath sounds.  Skin:    General: Skin is warm and dry.  Neurological:     General: No focal deficit present.     Mental Status: She is alert.       Specialty Comments:  No specialty comments available.  Imaging: XR HIP UNILAT W OR W/O PELVIS 2-3 VIEWS LEFT Result Date: 06/28/2024 XRs of the left hip from 06/28/2024 were independently reviewed and interpreted, showing nondisplaced left femoral neck fracture.  No change in alignment of the femoral neck since last films on 05/17/2024.  There are 3 partially-threaded  screws across the femoral neck into the femoral head.  No lucency seen around the screws.  None of the screws are backed out.    PMFS History: Patient Active Problem List   Diagnosis Date Noted   Diabetes mellitus without complication (HCC)    Arthralgia of multiple joints 08/31/2023   Paresthesia of both lower extremities 05/20/2021   History of simple renal cyst 04/23/2021   Bronchiectasis without complication (HCC) 08/14/2020   Language barrier 08/14/2020   Mixed conductive and sensorineural hearing loss of left ear with restricted hearing of right ear 06/19/2020   Solitary pulmonary nodule 03/05/2020   Chronic lung disease 10/31/2015   Hypertension associated with diabetes (HCC) 10/31/2015    Dyslipidemia associated with type 2 diabetes mellitus (HCC) 09/30/2015   Hyperlipidemia 09/30/2015   Past Medical History:  Diagnosis Date   Arthritis    knee   Bronchitis    Diabetes mellitus without complication (HCC)    Several years.   Dyspnea    GERD (gastroesophageal reflux disease)    Hyperlipidemia 09/30/2015   Hypertension    Stomach pain    with diarrhea    Family History  Problem Relation Age of Onset   Bronchitis Sister    Hypertension Sister    Stroke Sister    Hepatitis Daughter    Colon cancer Neg Hx    Esophageal cancer Neg Hx    Rectal cancer Neg Hx    Stomach cancer Neg Hx     Past Surgical History:  Procedure Laterality Date   COLONOSCOPY     ORIF HIP FRACTURE Left 03/26/2024   Procedure: OPEN REDUCTION INTERNAL FIXATION HIP;  Surgeon: Georgina Ozell LABOR, MD;  Location: [facility] OR;  Service: Orthopedics;  Laterality: Left;  HIP PINNING   Social History   Occupational History   Occupation: Housewife  Tobacco Use   Smoking status: Never   Smokeless tobacco: Never   Tobacco comments:    Chews cola nut  Vaping Use   Vaping status: Never Used  Substance and Sexual Activity   Alcohol use: No    Alcohol/week: 0.0 standard drinks of alcohol   Drug use: No   Sexual activity: Not Currently

## 2024-07-05 ENCOUNTER — Telehealth: Payer: Self-pay | Admitting: Orthopedic Surgery

## 2024-07-05 ENCOUNTER — Other Ambulatory Visit: Payer: Self-pay | Admitting: Emergency Medicine

## 2024-07-05 DIAGNOSIS — M2550 Pain in unspecified joint: Secondary | ICD-10-CM

## 2024-07-05 NOTE — Telephone Encounter (Signed)
 Pt son called asking where did Dr Georgina send pt for injection in knee. Dont see injection or referral for knee injection. Please call pt at [phone].

## 2024-07-05 NOTE — Telephone Encounter (Signed)
 Per Dr. Georgina he has not talked to her about the knees. I will call and advise tomorrow morning.

## 2024-07-06 ENCOUNTER — Telehealth: Payer: Self-pay

## 2024-07-06 NOTE — Telephone Encounter (Signed)
 They have to go through their orthopedist.  Thanks.

## 2024-07-06 NOTE — Telephone Encounter (Signed)
 Copied from CRM [id number]. Topic: Clinical - Medical Advice >> Jul 05, 2024  4:32 PM Chasity T wrote: Reason for CRM: Ismel son of patient is calling because mother is stating she is having shoulder and knee pain. Advised that if she got it done with the orthopedic he will have to call to make the appointment but he wants to know if pcp can give her the shot. Please contact back to confirm if he can or not.    I did give him the ortho number as well.

## 2024-07-06 NOTE — Telephone Encounter (Signed)
 I called and looks like looking back in her chart she saw Odis Mace at [facility]

## 2024-07-07 NOTE — Telephone Encounter (Signed)
 Spoke with son and informed him this needs to be done at the ortho office. He understood and will make appt. He had no further questions

## 2024-07-12 NOTE — Progress Notes (Unsigned)
   LILLETTE Ileana Collet, PhD, LAT, ATC acting as a scribe for Artist Lloyd, MD.  Cheryl Hopkins is a 73 y.o. female who presents to [facility] today for exacerbation of her R knee pain. Pt was last seen by Dr. Leonce on 01/07/23. Last knee steroid injection 12/18/22.  Today, pt reports ***  Pt also c/o R***L arm pain x ***. Pt locates pain to ***  Radiates:  UE Numbness/tingling: UE Weakness: Aggravates: Treatments tried:  Dx imaging: 12/18/22 R knee XR  Pertinent review of systems: ***  Relevant historical information: ***   Exam:  LMP 11/02/2010 (Approximate) Comment: postmenopausal General: Well Developed, well nourished, and in no acute distress.   MSK: ***    Lab and Radiology Results No results found for this or any previous visit (from the past 72 hours). No results found.     Assessment and Plan: 73 y.o. female with ***   PDMP not reviewed this encounter. No orders of the defined types were placed in this encounter.  No orders of the defined types were placed in this encounter.    Discussed warning signs or symptoms. Please see discharge instructions. Patient expresses understanding.   ***

## 2024-07-13 ENCOUNTER — Ambulatory Visit

## 2024-07-13 ENCOUNTER — Other Ambulatory Visit: Payer: Self-pay

## 2024-07-13 ENCOUNTER — Encounter: Payer: Self-pay | Admitting: Family Medicine

## 2024-07-13 ENCOUNTER — Ambulatory Visit: Admitting: Family Medicine

## 2024-07-13 VITALS — BP 122/70 | HR 74 | Ht 68.0 in | Wt 158.0 lb

## 2024-07-13 DIAGNOSIS — J984 Other disorders of lung: Secondary | ICD-10-CM

## 2024-07-13 DIAGNOSIS — G8929 Other chronic pain: Secondary | ICD-10-CM

## 2024-07-13 DIAGNOSIS — E1159 Type 2 diabetes mellitus with other circulatory complications: Secondary | ICD-10-CM

## 2024-07-13 DIAGNOSIS — M25512 Pain in left shoulder: Secondary | ICD-10-CM

## 2024-07-13 DIAGNOSIS — I152 Hypertension secondary to endocrine disorders: Secondary | ICD-10-CM

## 2024-07-13 DIAGNOSIS — J479 Bronchiectasis, uncomplicated: Secondary | ICD-10-CM

## 2024-07-13 NOTE — Patient Instructions (Addendum)
 Thank you for coming in today.   You received an injection today. Seek immediate medical attention if the joint becomes red, extremely painful, or is oozing fluid.   See you back when you're [place] the [place]! Have a great trip to [place]!

## 2024-07-17 ENCOUNTER — Ambulatory Visit: Admitting: Sports Medicine

## 2024-09-04 ENCOUNTER — Encounter: Payer: Self-pay | Admitting: Radiology

## 2024-09-05 ENCOUNTER — Telehealth: Payer: Self-pay

## 2024-09-05 NOTE — Telephone Encounter (Signed)
 I do not see any DME order in here

## 2024-09-05 NOTE — Telephone Encounter (Signed)
 Called and spoke with patient son and he stated that his mom already had one and was wanting to get a hold of the company that had possible sent them the O2 tank when she was discharged from the hospital and I went and looked back in the discharge notes and came across [facility] as there was discussion about pulmonary issues due to the patient dealing bronchitises. Gave the patient son center well number as this whom was on the discharge and could have possibly sent them the tank

## 2024-09-05 NOTE — Telephone Encounter (Signed)
 Copied from CRM [id number]. Topic: Clinical - Order For Equipment >> Sep 05, 2024  8:47 AM Frederich PARAS wrote: Reason for CRM: pt called in to get the dme company's info to have the company Pick up the oxygen  machine. Pt callback# is [phone]

## 2024-09-05 NOTE — Telephone Encounter (Signed)
 I have placed one and also informed [employer]

## 2024-09-05 NOTE — Addendum Note (Signed)
 Addended by: ROSALVA LEX RAMAN on: 09/05/2024 02:39 PM   Modules accepted: Orders

## 2024-09-05 NOTE — Telephone Encounter (Signed)
 I do not think we ever sent one.  If they need an order to pick up an oxygen machine please provide it.

## 2024-10-23 ENCOUNTER — Ambulatory Visit: Admitting: Orthopedic Surgery

## 2024-10-23 ENCOUNTER — Telehealth: Payer: Self-pay | Admitting: Emergency Medicine

## 2024-10-23 NOTE — Telephone Encounter (Signed)
 Please advise. Im not sure why they need a fax as I thought you could just return the tank

## 2024-10-23 NOTE — Telephone Encounter (Signed)
 Copied from CRM #[id number]. Topic: Clinical - Medication Question >> Oct 23, 2024 10:53 AM Emylou G wrote: Reason for CRM: Inetta Sables son [phone] called said needs the oxygen and tanks to be picked up - company requires us  to fax req[facility] [phone] fax; [phone] ( located in [place] )

## 2024-10-31 NOTE — Telephone Encounter (Signed)
 Not sure what they need.  Call company and inquire.  Thanks.

## 2024-11-13 ENCOUNTER — Other Ambulatory Visit (HOSPITAL_COMMUNITY): Payer: Self-pay

## 2024-11-13 ENCOUNTER — Telehealth: Payer: Self-pay | Admitting: Emergency Medicine

## 2024-11-13 NOTE — Telephone Encounter (Unsigned)
 Copied from CRM #[id number]. Topic: General - Other >> Nov 13, 2024  4:00 PM Eva FALCON wrote: Reason for CRM: Pt son Inetta is following up on a call back in November regarding oxygen tanks. States he was told by the company that the Dr needs to be the one that says they can be picked up? States they charge $10 a day to have the oxygen tank and he really wants them to be picked up. Requesting call back [phone].

## 2024-11-13 NOTE — Telephone Encounter (Unsigned)
 Copied from CRM #[id number]. Topic: Clinical - Medication Refill >> Nov 13, 2024  4:23 PM Alfonso ORN wrote: Medication: refill needed for all medication  Has the patient contacted their pharmacy? Yes  This is the patient's preferred pharmacy:  [facility] [address] ([address]), [place] - [address] [place] ([place]) [place] [address] Phone: [phone] Fax: [phone]   Is this the correct pharmacy for this prescription? Yes If no, delete pharmacy and type the correct one.   Has the prescription been filled recently? No  Is the patient out of the medication? no  Has the patient been seen for an appointment in the last year OR does the patient have an upcoming appointment? Yes  Can we respond through MyChart? No

## 2024-11-13 NOTE — Telephone Encounter (Signed)
 Contacted patients son to clarify the need for oxygen equipment. He stated that their mother no longer requires oxygen and they have been trying to return the equipment. The company informed them that they need a call or letter from us  to authorize pickup.   Action Taken: Confirmed with provider if it is okay to proceed. Provider approved.     Called company unable to get hold of them was put on hold of 12 minutes with no response. Letter printed and fax to number provided.

## 2024-11-14 NOTE — Telephone Encounter (Signed)
 Please schedule pt a 6 mnth f/u apptmnt with PCP as she is due for this visit a/w med refills.

## 2024-11-20 NOTE — Telephone Encounter (Signed)
 Spoke with Ray with [facility] DME company on behalf of patient. He indicated that the patient has a processing pickup for her oxygen tank that started on 11/14/2024. He stated that patient should be contacted before they arrive to pick it up.   He also stated that the patient does not have a balance on account, that all claims for December have been adjusted and the November claim is still processing.   Spoke with [employer], okay per DPR, and let him know the update of tank pickup and balance.

## 2024-11-21 ENCOUNTER — Encounter: Admitting: Emergency Medicine

## 2024-11-25 ENCOUNTER — Other Ambulatory Visit: Payer: Self-pay | Admitting: Emergency Medicine

## 2024-11-25 DIAGNOSIS — E1169 Type 2 diabetes mellitus with other specified complication: Secondary | ICD-10-CM

## 2024-11-25 DIAGNOSIS — E1159 Type 2 diabetes mellitus with other circulatory complications: Secondary | ICD-10-CM

## 2024-11-30 ENCOUNTER — Other Ambulatory Visit (HOSPITAL_COMMUNITY): Payer: Self-pay

## 2024-11-30 ENCOUNTER — Encounter: Payer: Self-pay | Admitting: Emergency Medicine

## 2024-11-30 ENCOUNTER — Ambulatory Visit: Admitting: Emergency Medicine

## 2024-11-30 ENCOUNTER — Ambulatory Visit: Payer: Self-pay | Admitting: Emergency Medicine

## 2024-11-30 VITALS — BP 124/80 | HR 76 | Temp 98.3°F | Ht 68.0 in | Wt 155.0 lb

## 2024-11-30 DIAGNOSIS — R202 Paresthesia of skin: Secondary | ICD-10-CM | POA: Diagnosis not present

## 2024-11-30 DIAGNOSIS — E785 Hyperlipidemia, unspecified: Secondary | ICD-10-CM | POA: Diagnosis not present

## 2024-11-30 DIAGNOSIS — Z0000 Encounter for general adult medical examination without abnormal findings: Secondary | ICD-10-CM

## 2024-11-30 DIAGNOSIS — H6692 Otitis media, unspecified, left ear: Secondary | ICD-10-CM

## 2024-11-30 DIAGNOSIS — M2550 Pain in unspecified joint: Secondary | ICD-10-CM | POA: Diagnosis not present

## 2024-11-30 DIAGNOSIS — E1159 Type 2 diabetes mellitus with other circulatory complications: Secondary | ICD-10-CM

## 2024-11-30 DIAGNOSIS — Z13228 Encounter for screening for other metabolic disorders: Secondary | ICD-10-CM

## 2024-11-30 DIAGNOSIS — E1169 Type 2 diabetes mellitus with other specified complication: Secondary | ICD-10-CM | POA: Diagnosis not present

## 2024-11-30 DIAGNOSIS — Z1329 Encounter for screening for other suspected endocrine disorder: Secondary | ICD-10-CM

## 2024-11-30 DIAGNOSIS — J479 Bronchiectasis, uncomplicated: Secondary | ICD-10-CM | POA: Diagnosis not present

## 2024-11-30 DIAGNOSIS — Z1231 Encounter for screening mammogram for malignant neoplasm of breast: Secondary | ICD-10-CM

## 2024-11-30 DIAGNOSIS — Z130 Encounter for screening for diseases of the blood and blood-forming organs and certain disorders involving the immune mechanism: Secondary | ICD-10-CM | POA: Diagnosis not present

## 2024-11-30 DIAGNOSIS — I152 Hypertension secondary to endocrine disorders: Secondary | ICD-10-CM

## 2024-11-30 DIAGNOSIS — Z0001 Encounter for general adult medical examination with abnormal findings: Secondary | ICD-10-CM

## 2024-11-30 LAB — COMPREHENSIVE METABOLIC PANEL WITH GFR
ALT: 18 U/L (ref 3–35)
AST: 24 U/L (ref 5–37)
Albumin: 4.2 g/dL (ref 3.5–5.2)
Alkaline Phosphatase: 80 U/L (ref 39–117)
BUN: 8 mg/dL (ref 6–23)
CO2: 35 meq/L — ABNORMAL HIGH (ref 19–32)
Calcium: 9.6 mg/dL (ref 8.4–10.5)
Chloride: 101 meq/L (ref 96–112)
Creatinine, Ser: 0.48 mg/dL (ref 0.40–1.20)
GFR: 94.88 mL/min
Glucose, Bld: 151 mg/dL — ABNORMAL HIGH (ref 70–99)
Potassium: 3.6 meq/L (ref 3.5–5.1)
Sodium: 140 meq/L (ref 135–145)
Total Bilirubin: 0.3 mg/dL (ref 0.2–1.2)
Total Protein: 8.2 g/dL (ref 6.0–8.3)

## 2024-11-30 LAB — LIPID PANEL
Cholesterol: 202 mg/dL — ABNORMAL HIGH (ref 28–200)
HDL: 51.8 mg/dL
LDL Cholesterol: 128 mg/dL — ABNORMAL HIGH (ref 10–99)
NonHDL: 149.9
Total CHOL/HDL Ratio: 4
Triglycerides: 108 mg/dL (ref 10.0–149.0)
VLDL: 21.6 mg/dL (ref 0.0–40.0)

## 2024-11-30 LAB — CBC WITH DIFFERENTIAL/PLATELET
Basophils Absolute: 0 10*3/uL (ref 0.0–0.1)
Basophils Relative: 0.7 % (ref 0.0–3.0)
Eosinophils Absolute: 0.3 10*3/uL (ref 0.0–0.7)
Eosinophils Relative: 5 % (ref 0.0–5.0)
HCT: 49.3 % — ABNORMAL HIGH (ref 36.0–46.0)
Hemoglobin: 15.7 g/dL — ABNORMAL HIGH (ref 12.0–15.0)
Lymphocytes Relative: 38.3 % (ref 12.0–46.0)
Lymphs Abs: 2.3 10*3/uL (ref 0.7–4.0)
MCHC: 31.9 g/dL (ref 30.0–36.0)
MCV: 83.5 fl (ref 78.0–100.0)
Monocytes Absolute: 0.5 10*3/uL (ref 0.1–1.0)
Monocytes Relative: 8.6 % (ref 3.0–12.0)
Neutro Abs: 2.9 10*3/uL (ref 1.4–7.7)
Neutrophils Relative %: 47.4 % (ref 43.0–77.0)
Platelets: 302 10*3/uL (ref 150.0–400.0)
RBC: 5.9 Mil/uL — ABNORMAL HIGH (ref 3.87–5.11)
RDW: 15.1 % (ref 11.5–15.5)
WBC: 6 10*3/uL (ref 4.0–10.5)

## 2024-11-30 LAB — HEMOGLOBIN A1C: Hgb A1c MFr Bld: 8.1 % — ABNORMAL HIGH (ref 4.6–6.5)

## 2024-11-30 MED ORDER — GLIMEPIRIDE 2 MG PO TABS: 2.0000 mg | ORAL_TABLET | Freq: Every day | ORAL | 1 refills | Status: AC

## 2024-11-30 MED ORDER — AMLODIPINE BESYLATE 10 MG PO TABS: 10.0000 mg | ORAL_TABLET | Freq: Every day | ORAL | 3 refills | Status: AC

## 2024-11-30 MED ORDER — METFORMIN HCL ER 500 MG PO TB24: 500.0000 mg | ORAL_TABLET | Freq: Two times a day (BID) | ORAL | Status: AC

## 2024-11-30 MED ORDER — BUDESONIDE-FORMOTEROL FUMARATE 80-4.5 MCG/ACT IN AERO: 2.0000 | INHALATION_SPRAY | Freq: Two times a day (BID) | RESPIRATORY_TRACT | 3 refills | Status: AC

## 2024-11-30 MED ORDER — SITAGLIPTIN PHOSPHATE 50 MG PO TABS: 50.0000 mg | ORAL_TABLET | Freq: Every day | ORAL | 1 refills | Status: AC

## 2024-11-30 MED ORDER — ROSUVASTATIN CALCIUM 20 MG PO TABS: 20.0000 mg | ORAL_TABLET | Freq: Every day | ORAL | 1 refills | Status: AC

## 2024-11-30 MED ORDER — FARXIGA 10 MG PO TABS: 10.0000 mg | ORAL_TABLET | Freq: Every day | ORAL | 3 refills | Status: AC

## 2024-11-30 MED ORDER — GABAPENTIN 300 MG PO CAPS: 300.0000 mg | ORAL_CAPSULE | Freq: Every day | ORAL | 0 refills | Status: AC

## 2024-11-30 MED ORDER — MELOXICAM 15 MG PO TABS: 15.0000 mg | ORAL_TABLET | Freq: Every day | ORAL | 1 refills | Status: AC | PRN

## 2024-11-30 MED ORDER — AZITHROMYCIN 250 MG PO TABS: ORAL_TABLET | ORAL | 0 refills | Status: AC

## 2024-11-30 NOTE — Assessment & Plan Note (Signed)
 Stable.  Gabapentin  helps Continue as needed 300 mg at bedtime

## 2024-11-30 NOTE — Assessment & Plan Note (Signed)
 Acute active infection Recommend azithromycin  daily for 5 days

## 2024-11-30 NOTE — Assessment & Plan Note (Signed)
 BP Readings from Last 3 Encounters:  11/30/24 124/80  07/13/24 122/70  05/15/24 126/64   Lab Results  Component Value Date   HGBA1C 7.4 (H) 05/15/2024  Well-controlled hypertension Continue amlodipine  10 mg daily Will do hemoglobin A1c today Continues glimepiride  2 mg daily, metformin  500 mg twice a day and Januvia  50 mg daily Diet and nutrition discussed Blood work done today Follow-up in 6 months

## 2024-11-30 NOTE — Assessment & Plan Note (Signed)
 Chronic stable conditions Continue Januvia, metformin, and glimepiride Continue rosuvastatin 20 mg daily Lipid profile done today Diet and nutrition discussed

## 2024-11-30 NOTE — Patient Instructions (Signed)
 Health Maintenance After Age 72 After age 27, you are at a higher risk for certain long-term diseases and infections as well as injuries from falls. Falls are a major cause of broken bones and head injuries in people who are older than age 73. Getting regular preventive care can help to keep you healthy and well. Preventive care includes getting regular testing and making lifestyle changes as recommended by your health care provider. Talk with your health care provider about: Which screenings and tests you should have. A screening is a test that checks for a disease when you have no symptoms. A diet and exercise plan that is right for you. What should I know about screenings and tests to prevent falls? Screening and testing are the best ways to find a health problem early. Early diagnosis and treatment give you the best chance of managing medical conditions that are common after age 90. Certain conditions and lifestyle choices may make you more likely to have a fall. Your health care provider may recommend: Regular vision checks. Poor vision and conditions such as cataracts can make you more likely to have a fall. If you wear glasses, make sure to get your prescription updated if your vision changes. Medicine review. Work with your health care provider to regularly review all of the medicines you are taking, including over-the-counter medicines. Ask your health care provider about any side effects that may make you more likely to have a fall. Tell your health care provider if any medicines that you take make you feel dizzy or sleepy. Strength and balance checks. Your health care provider may recommend certain tests to check your strength and balance while standing, walking, or changing positions. Foot health exam. Foot pain and numbness, as well as not wearing proper footwear, can make you more likely to have a fall. Screenings, including: Osteoporosis screening. Osteoporosis is a condition that causes  the bones to get weaker and break more easily. Blood pressure screening. Blood pressure changes and medicines to control blood pressure can make you feel dizzy. Depression screening. You may be more likely to have a fall if you have a fear of falling, feel depressed, or feel unable to do activities that you used to do. Alcohol  use screening. Using too much alcohol  can affect your balance and may make you more likely to have a fall. Follow these instructions at home: Lifestyle Do not drink alcohol  if: Your health care provider tells you not to drink. If you drink alcohol : Limit how much you have to: 0-1 drink a day for women. 0-2 drinks a day for men. Know how much alcohol  is in your drink. In the [place] one drink equals one 12 oz bottle of beer (355 mL), one 5 oz glass of wine (148 mL), or one 1 oz glass of hard liquor (44 mL). Do not use any products that contain nicotine or tobacco. These products include cigarettes, chewing tobacco, and vaping devices, such as e-cigarettes. If you need help quitting, ask your health care provider. Activity  Follow a regular exercise program to stay fit. This will help you maintain your balance. Ask your health care provider what types of exercise are appropriate for you. If you need a cane or walker, use it as recommended by your health care provider. Wear supportive shoes that have nonskid soles. Safety  Remove any tripping hazards, such as rugs, cords, and clutter. Install safety equipment such as grab bars in bathrooms and safety rails on stairs. Keep rooms and walkways  well-lit. General instructions Talk with your health care provider about your risks for falling. Tell your health care provider if: You fall. Be sure to tell your health care provider about all falls, even ones that seem minor. You feel dizzy, tiredness (fatigue), or off-balance. Take over-the-counter and prescription medicines only as told by your health care provider. These include  supplements. Eat a healthy diet and maintain a healthy weight. A healthy diet includes low-fat dairy products, low-fat (lean) meats, and fiber from whole grains, beans, and lots of fruits and vegetables. Stay current with your vaccines. Schedule regular health, dental, and eye exams. Summary Having a healthy lifestyle and getting preventive care can help to protect your health and wellness after age 15. Screening and testing are the best way to find a health problem early and help you avoid having a fall. Early diagnosis and treatment give you the best chance for managing medical conditions that are more common for people who are older than age 42. Falls are a major cause of broken bones and head injuries in people who are older than age 64. Take precautions to prevent a fall at home. Work with your health care provider to learn what changes you can make to improve your health and wellness and to prevent falls. This information is not intended to replace advice given to you by your health care provider. Make sure you discuss any questions you have with your health care provider. Document Revised: 03/10/2021 Document Reviewed: 03/10/2021 Elsevier Patient Education  2024 [employer].

## 2024-11-30 NOTE — Assessment & Plan Note (Signed)
 Chronic stable condition Meloxicam helps Recommend meloxicam 15 mg as needed

## 2024-11-30 NOTE — Progress Notes (Signed)
 Cheryl Hopkins 74 y.o.   Chief Complaint  Patient presents with   Annual Exam    Pt states that she has a cold and on she has pain on her left side of her side as well as in the left ear patient states that she cannot hear and hear some noises     HISTORY OF PRESENT ILLNESS: This is a 74 y.o. female here for annual exam and follow-up on multiple chronic medical conditions including diabetes Accompanied by family members helping with translation Also complaining of pain to left ear.  Cold symptoms over the last 2 weeks No other complaints or medical concerns today. Lab Results  Component Value Date   HGBA1C 7.4 (H) 05/15/2024     HPI   Prior to Admission medications  Medication Sig Start Date End Date Taking? Authorizing Provider  amLODipine  (NORVASC ) 10 MG tablet Take 1 tablet by mouth once daily 11/26/24  Yes Rowan Pollman Jose, MD  budesonide -formoterol  (SYMBICORT ) 80-4.5 MCG/ACT inhaler Inhale 2 puffs into the lungs 2 (two) times daily. 06/25/22  Yes Purcell Emil Schanz, MD  FARXIGA  10 MG TABS tablet TAKE 1 TABLET BY MOUTH ONCE DAILY BEFORE BREAKFAST 11/26/24  Yes Purcell Emil Schanz, MD  gabapentin  (NEURONTIN ) 300 MG capsule Take 1 capsule by mouth at bedtime 05/29/23  Yes Shiah Berhow Jose, MD  glimepiride  (AMARYL ) 2 MG tablet Take 1 tablet (2 mg total) by mouth daily with breakfast. TAKE ONE TABLET BY MOUTH WITH BREAKFAST 05/15/24 05/10/25 Yes Mishelle Hassan, Emil Schanz, MD  meloxicam  (MOBIC ) 15 MG tablet TAKE 1 TABLET BY MOUTH ONCE DAILY AS NEEDED FOR PAIN 07/05/24  Yes Russel Morain Jose, MD  metFORMIN  (GLUCOPHAGE -XR) 500 MG 24 hr tablet TAKE 1 TABLET BY MOUTH TWICE DAILY WITH A MEAL 11/26/24  Yes Bertha Earwood Jose, MD  polyethylene glycol powder (GLYCOLAX /MIRALAX ) 17 GM/SCOOP powder Take 17 g by mouth daily as needed for mild constipation. 03/28/24  Yes Verdene Purchase, MD  RESTASIS 0.05 % ophthalmic emulsion Place 1 drop into both eyes 2 (two) times daily as needed (Dry  eye). 03/04/24  Yes [provider]  rosuvastatin  (CRESTOR ) 20 MG tablet Take 1 tablet (20 mg total) by mouth daily. 08/31/23 11/30/24 Yes Shaya Altamura, Emil Schanz, MD  senna-docusate (SENOKOT-S) 8.6-50 MG tablet Take 2 tablets by mouth 2 (two) times daily. 03/28/24  Yes Krishnan, Gokul, MD  sitaGLIPtin  (JANUVIA ) 50 MG tablet Take 1 tablet (50 mg total) by mouth daily. 05/15/24 05/10/25 Yes Purcell Emil Schanz, MD    Allergies[1]  Patient Active Problem List   Diagnosis Date Noted   Diabetes mellitus without complication (HCC)    Arthralgia of multiple joints 08/31/2023   Paresthesia of both lower extremities 05/20/2021   History of simple renal cyst 04/23/2021   Bronchiectasis without complication (HCC) 08/14/2020   Language barrier 08/14/2020   Mixed conductive and sensorineural hearing loss of left ear with restricted hearing of right ear 06/19/2020   Solitary pulmonary nodule 03/05/2020   Chronic lung disease 10/31/2015   Hypertension associated with diabetes (HCC) 10/31/2015   Dyslipidemia associated with type 2 diabetes mellitus (HCC) 09/30/2015   Hyperlipidemia 09/30/2015    Past Medical History:  Diagnosis Date   Arthritis    knee   Bronchitis    Diabetes mellitus without complication (HCC)    Several years.   Dyspnea    GERD (gastroesophageal reflux disease)    Hyperlipidemia 09/30/2015   Hypertension    Stomach pain    with diarrhea    Past  Surgical History:  Procedure Laterality Date   COLONOSCOPY     ORIF HIP FRACTURE Left 03/26/2024   Procedure: OPEN REDUCTION INTERNAL FIXATION HIP;  Surgeon: Georgina Ozell LABOR, MD;  Location: [facility] OR;  Service: Orthopedics;  Laterality: Left;  HIP PINNING    Social History   Socioeconomic History   Marital status: Widowed    Spouse name: Widowed since 2010   Number of children: 9   Years of education: <8   Highest education level: Not on file  Occupational History   Occupation: Housewife  Tobacco Use   Smoking  status: Never   Smokeless tobacco: Never   Tobacco comments:    Chews cola nut  Vaping Use   Vaping status: Never Used  Substance and Sexual Activity   Alcohol use: No    Alcohol/week: 0.0 standard drinks of alcohol   Drug use: No   Sexual activity: Not Currently  Other Topics Concern   Not on file  Social History Narrative      Widow   Originally from [place] - Came to [employer]. In 2016   Lives with son and his wife and their 3 children.        Exercise - no exercise      Seatbelt - 100%   Gun in home - no   Social Drivers of Health   Tobacco Use: Low Risk (07/13/2024)   Patient History    Smoking Tobacco Use: Never    Smokeless Tobacco Use: Never    Passive Exposure: Not on file  Financial Resource Strain: Not on file  Food Insecurity: No Food Insecurity (03/31/2024)   Hunger Vital Sign    Worried About Running Out of Food in the Last Year: Never true    Ran Out of Food in the Last Year: Never true  Transportation Needs: No Transportation Needs (03/31/2024)   PRAPARE - Administrator, Civil Service (Medical): No    Lack of Transportation (Non-Medical): No  Physical Activity: Not on file  Stress: Not on file  Social Connections: Moderately Isolated (03/26/2024)   Social Connection and Isolation Panel    Frequency of Communication with Friends and Family: Three times a week    Frequency of Social Gatherings with Friends and Family: Three times a week    Attends Religious Services: Never    Active Member of Clubs or Organizations: No    Attends Banker Meetings: Never    Marital Status: Married  Catering Manager Violence: Not At Risk (03/31/2024)   Humiliation, Afraid, Rape, and Kick questionnaire    Fear of Current or Ex-Partner: No    Emotionally Abused: No    Physically Abused: No    Sexually Abused: No  Depression (PHQ2-9): Low Risk (11/29/2023)   Depression (PHQ2-9)    PHQ-2 Score: 0  Alcohol Screen: Not on file  Housing: Low Risk (03/31/2024)    Housing Stability Vital Sign    Unable to Pay for Housing in the Last Year: No    Number of Times Moved in the Last Year: 0    Homeless in the Last Year: No  Utilities: Not At Risk (03/31/2024)   [facility] Utilities    Threatened with loss of utilities: No  Health Literacy: Not on file    Family History  Problem Relation Age of Onset   Bronchitis Sister    Hypertension Sister    Stroke Sister    Hepatitis Daughter    Colon cancer Neg Hx  Esophageal cancer Neg Hx    Rectal cancer Neg Hx    Stomach cancer Neg Hx      Review of Systems  Constitutional: Negative.  Negative for chills and fever.  HENT:  Positive for congestion and ear pain. Negative for sore throat.   Respiratory: Negative.  Negative for cough and shortness of breath.   Cardiovascular: Negative.  Negative for chest pain and palpitations.  Gastrointestinal:  Negative for abdominal pain, diarrhea, nausea and vomiting.  Genitourinary: Negative.  Negative for dysuria and hematuria.  Skin: Negative.  Negative for rash.  Neurological: Negative.  Negative for dizziness and headaches.  All other systems reviewed and are negative.   Vitals:   11/30/24 1042  BP: 124/80  Pulse: 76  Temp: 98.3 F (36.8 C)  SpO2: 95%    Physical Exam Vitals reviewed.  Constitutional:      Appearance: Normal appearance.  HENT:     Head: Normocephalic.     Right Ear: Tympanic membrane, ear canal and external ear normal.     Left Ear: Tympanic membrane is erythematous.     Mouth/Throat:     Mouth: Mucous membranes are moist.     Pharynx: Oropharynx is clear.  Eyes:     Extraocular Movements: Extraocular movements intact.     Conjunctiva/sclera: Conjunctivae normal.     Pupils: Pupils are equal, round, and reactive to light.  Cardiovascular:     Rate and Rhythm: Normal rate and regular rhythm.     Pulses: Normal pulses.     Heart sounds: Normal heart sounds.  Pulmonary:     Effort: Pulmonary effort is normal.     Breath  sounds: Rhonchi present. No wheezing or rales.  Abdominal:     Palpations: Abdomen is soft.     Tenderness: There is no abdominal tenderness.  Musculoskeletal:     Cervical back: No tenderness.     Right lower leg: No edema.     Left lower leg: No edema.  Lymphadenopathy:     Cervical: No cervical adenopathy.  Skin:    General: Skin is warm and dry.     Capillary Refill: Capillary refill takes less than 2 seconds.  Neurological:     General: No focal deficit present.     Mental Status: She is alert and oriented to person, place, and time.  Psychiatric:        Mood and Affect: Mood normal.        Behavior: Behavior normal.      ASSESSMENT & PLAN: Problem List Items Addressed This Visit       Cardiovascular and Mediastinum   Hypertension associated with diabetes (HCC)   BP Readings from Last 3 Encounters:  11/30/24 124/80  07/13/24 122/70  05/15/24 126/64   Lab Results  Component Value Date   HGBA1C 7.4 (H) 05/15/2024  Well-controlled hypertension Continue amlodipine  10 mg daily Will do hemoglobin A1c today Continues glimepiride  2 mg daily, metformin  500 mg twice a day and Januvia  50 mg daily Diet and nutrition discussed Blood work done today Follow-up in 6 months       Relevant Medications   glimepiride  (AMARYL ) 2 MG tablet   rosuvastatin  (CRESTOR ) 20 MG tablet   sitaGLIPtin  (JANUVIA ) 50 MG tablet   amLODipine  (NORVASC ) 10 MG tablet   FARXIGA  10 MG TABS tablet   metFORMIN  (GLUCOPHAGE -XR) 500 MG 24 hr tablet   Other Relevant Orders   CBC with Differential/Platelet   Comprehensive metabolic panel with GFR   Hemoglobin  A1c   Lipid panel     Respiratory   Bronchiectasis without complication (HCC)   Clinically stable.  Some dyspnea on exertion No signs of pneumonia No concerns identified today        Endocrine   Dyslipidemia associated with type 2 diabetes mellitus (HCC)   Chronic stable conditions Continue Januvia , metformin , and glimepiride  Continue  rosuvastatin  20 mg daily Lipid profile done today Diet and nutrition discussed        Relevant Medications   glimepiride  (AMARYL ) 2 MG tablet   rosuvastatin  (CRESTOR ) 20 MG tablet   sitaGLIPtin  (JANUVIA ) 50 MG tablet   FARXIGA  10 MG TABS tablet   metFORMIN  (GLUCOPHAGE -XR) 500 MG 24 hr tablet   Other Relevant Orders   CBC with Differential/Platelet   Comprehensive metabolic panel with GFR   Hemoglobin A1c   Lipid panel     Nervous and Auditory   Left otitis media   Acute active infection Recommend azithromycin  daily for 5 days      Relevant Medications   azithromycin  (ZITHROMAX ) 250 MG tablet     Other   Paresthesia of both lower extremities   Stable.  Gabapentin  helps Continue as needed 300 mg at bedtime      Relevant Medications   gabapentin  (NEURONTIN ) 300 MG capsule   Arthralgia of multiple joints   Chronic stable condition Meloxicam  helps Recommend meloxicam  15 mg as needed      Relevant Medications   meloxicam  (MOBIC ) 15 MG tablet   Other Visit Diagnoses       Encounter for general adult medical examination with abnormal findings    -  Primary   Relevant Orders   CBC with Differential/Platelet   Comprehensive metabolic panel with GFR   Hemoglobin A1c   Lipid panel     Screening for deficiency anemia       Relevant Orders   CBC with Differential/Platelet     Screening for endocrine, metabolic and immunity disorder         Screening mammogram for breast cancer       Relevant Orders   MM 3D SCREENING MAMMOGRAM BILATERAL BREAST        Modifiable risk factors discussed with patient. Anticipatory guidance according to age provided. The following topics were also discussed: Social Determinants of Health Smoking.  Non-smoker Diet and nutrition Benefits of exercise Cancer screening and need for breast cancer screening with mammogram this year Vaccinations reviewed and recommendations Cardiovascular risk assessment and need for blood work The  10-year ASCVD risk score (Arnett DK, et al., 2019) is: 25.4%   Values used to calculate the score:     Age: 31 years     Clinically relevant sex: Female     Is Non-Hispanic African American: No     Diabetic: Yes     Tobacco smoker: No     Systolic Blood Pressure: 124 mmHg     Is BP treated: Yes     HDL Cholesterol: 53.7 mg/dL     Total Cholesterol: 163 mg/dL Review of multiple chronic medical conditions under management Review of all medications Mental health including depression and anxiety Fall and accident prevention  Patient Instructions  Health Maintenance After Age 62 After age 25, you are at a higher risk for certain long-term diseases and infections as well as injuries from falls. Falls are a major cause of broken bones and head injuries in people who are older than age 49. Getting regular preventive care can help to keep you healthy  and well. Preventive care includes getting regular testing and making lifestyle changes as recommended by your health care provider. Talk with your health care provider about: Which screenings and tests you should have. A screening is a test that checks for a disease when you have no symptoms. A diet and exercise plan that is right for you. What should I know about screenings and tests to prevent falls? Screening and testing are the best ways to find a health problem early. Early diagnosis and treatment give you the best chance of managing medical conditions that are common after age 41. Certain conditions and lifestyle choices may make you more likely to have a fall. Your health care provider may recommend: Regular vision checks. Poor vision and conditions such as cataracts can make you more likely to have a fall. If you wear glasses, make sure to get your prescription updated if your vision changes. Medicine review. Work with your health care provider to regularly review all of the medicines you are taking, including over-the-counter medicines. Ask your  health care provider about any side effects that may make you more likely to have a fall. Tell your health care provider if any medicines that you take make you feel dizzy or sleepy. Strength and balance checks. Your health care provider may recommend certain tests to check your strength and balance while standing, walking, or changing positions. Foot health exam. Foot pain and numbness, as well as not wearing proper footwear, can make you more likely to have a fall. Screenings, including: Osteoporosis screening. Osteoporosis is a condition that causes the bones to get weaker and break more easily. Blood pressure screening. Blood pressure changes and medicines to control blood pressure can make you feel dizzy. Depression screening. You may be more likely to have a fall if you have a fear of falling, feel depressed, or feel unable to do activities that you used to do. Alcohol use screening. Using too much alcohol can affect your balance and may make you more likely to have a fall. Follow these instructions at home: Lifestyle Do not drink alcohol if: Your health care provider tells you not to drink. If you drink alcohol: Limit how much you have to: 0-1 drink a day for women. 0-2 drinks a day for men. Know how much alcohol is in your drink. In the [place] one drink equals one 12 oz bottle of beer (355 mL), one 5 oz glass of wine (148 mL), or one 1 oz glass of hard liquor (44 mL). Do not use any products that contain nicotine or tobacco. These products include cigarettes, chewing tobacco, and vaping devices, such as e-cigarettes. If you need help quitting, ask your health care provider. Activity  Follow a regular exercise program to stay fit. This will help you maintain your balance. Ask your health care provider what types of exercise are appropriate for you. If you need a cane or walker, use it as recommended by your health care provider. Wear supportive shoes that have nonskid  soles. Safety  Remove any tripping hazards, such as rugs, cords, and clutter. Install safety equipment such as grab bars in bathrooms and safety rails on stairs. Keep rooms and walkways well-lit. General instructions Talk with your health care provider about your risks for falling. Tell your health care provider if: You fall. Be sure to tell your health care provider about all falls, even ones that seem minor. You feel dizzy, tiredness (fatigue), or off-balance. Take over-the-counter and prescription medicines only as told by  your health care provider. These include supplements. Eat a healthy diet and maintain a healthy weight. A healthy diet includes low-fat dairy products, low-fat (lean) meats, and fiber from whole grains, beans, and lots of fruits and vegetables. Stay current with your vaccines. Schedule regular health, dental, and eye exams. Summary Having a healthy lifestyle and getting preventive care can help to protect your health and wellness after age 24. Screening and testing are the best way to find a health problem early and help you avoid having a fall. Early diagnosis and treatment give you the best chance for managing medical conditions that are more common for people who are older than age 42. Falls are a major cause of broken bones and head injuries in people who are older than age 45. Take precautions to prevent a fall at home. Work with your health care provider to learn what changes you can make to improve your health and wellness and to prevent falls. This information is not intended to replace advice given to you by your health care provider. Make sure you discuss any questions you have with your health care provider. Document Revised: 03/10/2021 Document Reviewed: 03/10/2021 Elsevier Patient Education  2024 Elsevier Inc.    Emil Schaumann, MD [facility]    [1]  Allergies Allergen Reactions   Porcine (Pork) Protein-Containing Drug  Products Other (See Comments)    religious

## 2024-11-30 NOTE — Assessment & Plan Note (Signed)
 Clinically stable.  Some dyspnea on exertion No signs of pneumonia No concerns identified today

## 2024-12-07 ENCOUNTER — Ambulatory Visit: Payer: Self-pay

## 2024-12-07 NOTE — Telephone Encounter (Signed)
 Called and spoke with the patient and he stated that he is going to take the patient to urgent care due to the pain severity

## 2024-12-07 NOTE — Telephone Encounter (Signed)
 FYI Only or Action Required?: Action required by provider: update on patient condition.  Patient was last seen in primary care on 11/30/2024 by Purcell Emil Schanz, MD.  Called Nurse Triage reporting Generalized Body Aches.  Symptoms began several days ago.  Interventions attempted: Prescription medications: Meloxicam .  Symptoms are: gradually worsening.  Triage Disposition: Call PCP Now (overriding See HCP Within 4 Hours (Or PCP Triage))  Patient/caregiver understands and will follow disposition?: Yes  Message from Berwyn MATSU sent at 12/07/2024 11:08 AM EST  Reason for Triage: left side numbness and pian from her ribs to pelvis 2 days now per patients son patient is not feeling good.   Reason for Disposition  [1] SEVERE back pain (e.g., excruciating, unable to do any normal activities) AND [2] not improved 2 hours after pain medicine  Answer Assessment - Initial Assessment Questions Spoke with pt's son, pt has been taking Meloxicam  and wanting to make sure it was for pain. Past 2 days hasn't been effective for pain. Pt just had OV on 11/30/24. No appts with PCP so advised son I would send message back to provider to see if something different can be sent in for pain. Please FU with son.   1. ONSET: When did the pain begin? (e.g., minutes, hours, days)     Ongoing pain  2. LOCATION: Where does it hurt? (upper, mid or lower back)     L side from ribs to pelvis 3. SEVERITY: How bad is the pain?  (e.g., Scale 1-10; mild, moderate, or severe)     Mild to moderate  4. PATTERN: Is the pain constant? (e.g., yes, no; constant, intermittent)      Constant  8. MEDICINES: What have you taken so far for the pain? (e.g., nothing, acetaminophen , NSAIDS)     Meloxicam  10. OTHER SYMPTOMS: Do you have any other symptoms? (e.g., fever, abdomen pain, burning with urination, blood in urine)  Protocols used: Back Pain-A-AH

## 2024-12-07 NOTE — Telephone Encounter (Signed)
 Please advise.

## 2024-12-07 NOTE — Telephone Encounter (Signed)
 Recommend office visit or visit to urgent care center.  Needs to be seen and evaluated further.

## 2024-12-08 ENCOUNTER — Ambulatory Visit: Admitting: Radiology

## 2024-12-08 ENCOUNTER — Ambulatory Visit
Admission: EM | Admit: 2024-12-08 | Discharge: 2024-12-08 | Disposition: A | Discharge status: Home / Self Care | Source: Home / Self Care

## 2024-12-08 ENCOUNTER — Other Ambulatory Visit: Payer: Self-pay

## 2024-12-08 DIAGNOSIS — R1022 Pelvic and perineal pain left side: Secondary | ICD-10-CM

## 2024-12-08 DIAGNOSIS — R81 Glycosuria: Secondary | ICD-10-CM

## 2024-12-08 DIAGNOSIS — M545 Low back pain, unspecified: Secondary | ICD-10-CM

## 2024-12-08 LAB — POCT URINE DIPSTICK
Bilirubin, UA: NEGATIVE
Blood, UA: NEGATIVE
Glucose, UA: >=1000 mg/dL — AB
Ketones, POC UA: NEGATIVE mg/dL
Leukocytes, UA: NEGATIVE
Nitrite, UA: NEGATIVE
Protein Ur, POC: NEGATIVE mg/dL
Spec Grav, UA: 1.01
Urobilinogen, UA: 0.2 U/dL
pH, UA: 5

## 2024-12-08 LAB — GLUCOSE, POCT (MANUAL RESULT ENTRY): POC Glucose: 102 mg/dL — AB (ref 70–99)

## 2024-12-08 MED ORDER — TRIAMCINOLONE ACETONIDE 40 MG/ML IJ SUSP
40.0000 mg | Freq: Once | INTRAMUSCULAR | Status: AC
  Administered 2024-12-08: 40 mg via INTRAMUSCULAR

## 2024-12-08 NOTE — ED Triage Notes (Signed)
 Pt presents with son on today's visit. IPAD translator offered. Pt denied. Requesting son to translate.   Pt presents with a chief complaint of lower left-sided back pain. Denies urinary symptoms. Describes as a poking sensation. Rates pain as a 10/10. Meloxicam  was prescribed by her provider on 1/29 and it has not helped her pain at all.

## 2024-12-08 NOTE — Discharge Instructions (Addendum)
 You were seen today for concerns of left-sided pain that has been ongoing since yesterday.  Your imaging has been reviewed by radiology and they do not see any evidence of an acute bony injury at this time.  Your urine did not show evidence of blood or infection.  At this time I am unsure what is causing your pain but to assist with potential inflammation we administered a Kenalog  40 mg steroid injection.  Please note that steroids can cause increased hunger, blood sugar spikes and increased irritability but this should start to improve with time.  I do recommend that you follow-up with your primary care provider next week to assess what might be causing your symptoms and to have further, ongoing management.  If you have any of the following symptoms please go immediately to the ER: More severe pain, inability to walk, numbness or weakness on one side of the body, fever.  If you start having abdominal pain or the pain starts to move to another area I would also recommend going to the ER for evaluation.

## 2024-12-08 NOTE — ED Provider Notes (Signed)
 " [facility]    CSN: [id number] Arrival date & time: 12/08/24  1825      History   Chief Complaint Chief Complaint  Patient presents with   Back Pain    HPI Cheryl Hopkins is a 74 y.o. female.   HPI  Pt is here today with her son, he is providing translation services.  He states that patient is having left sided lower back pain. He states this started as a light pain in the upper left thoracic area that has moved down to the lower back area. She initially had concerns for something going on with her kidney but they deny dysuria.  She has a previous hx of hip surgery on the left side completed in 2025 She is having more severe pain especially with laying on the left side. She reports pain with palpation of the area and flexion of the left hip.   Patient took a dose of meloxicam  earlier this morning.  She reports minimal improvement in pain with this.  Past Medical History:  Diagnosis Date   Arthritis    knee   Bronchitis    Diabetes mellitus without complication (HCC)    Several years.   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/19/2020   Solitary pulmonary nodule 03/05/2020   Chronic lung disease 10/31/2015   Hypertension associated with diabetes (HCC) 10/31/2015   Dyslipidemia associated with type 2 diabetes mellitus (HCC) 09/30/2015   Hyperlipidemia 09/30/2015    Past Surgical History:  Procedure Laterality Date    COLONOSCOPY     ORIF HIP FRACTURE Left 03/26/2024   Procedure: OPEN REDUCTION INTERNAL FIXATION HIP;  Surgeon: Georgina Ozell LABOR, MD;  Location: [facility] OR;  Service: Orthopedics;  Laterality: Left;  HIP PINNING    OB History     Gravida  10   Para  9   Term  9   Preterm      AB  1   Living  9      SAB  1   IAB      Ectopic      Multiple      Live Births               Home Medications    Prior to Admission medications  Medication Sig Start Date End Date Taking? Authorizing Provider  amLODipine  (NORVASC ) 10 MG tablet Take 1 tablet (10 mg total) by mouth daily. 11/30/24   Sagardia, Miguel Jose, MD  budesonide -formoterol  (SYMBICORT ) 80-4.5 MCG/ACT inhaler Inhale 2 puffs into the lungs 2 (two) times daily. 11/30/24   Purcell Emil Schanz, MD  FARXIGA  10 MG TABS tablet Take 1 tablet (10 mg total) by mouth daily before breakfast. 11/30/24   Sagardia, Miguel Jose, MD  gabapentin  (NEURONTIN ) 300 MG capsule Take 1 capsule (300 mg total) by mouth at bedtime. 11/30/24   Purcell Emil Schanz, MD  glimepiride  (AMARYL ) 2 MG tablet Take 1 tablet (  2 mg total) by mouth daily with breakfast. TAKE ONE TABLET BY MOUTH WITH BREAKFAST 11/30/24 11/25/25  Purcell Emil Schanz, MD  meloxicam  (MOBIC ) 15 MG tablet Take 1 tablet (15 mg total) by mouth daily as needed for pain. 11/30/24   Sagardia, Miguel Jose, MD  metFORMIN  (GLUCOPHAGE -XR) 500 MG 24 hr tablet Take 1 tablet (500 mg total) by mouth 2 (two) times daily with a meal. 11/30/24   Sagardia, Emil Schanz, MD  polyethylene glycol powder (GLYCOLAX /MIRALAX ) 17 GM/SCOOP powder Take 17 g by mouth daily as needed for mild constipation. 03/28/24   Krishnan, Gokul, MD  RESTASIS 0.05 % ophthalmic emulsion Place 1 drop into both eyes 2 (two) times daily as needed (Dry eye). 03/04/24   [provider]  rosuvastatin  (CRESTOR ) 20 MG tablet Take 1 tablet (20 mg total) by mouth daily. 11/30/24 11/25/25  Purcell Emil Schanz, MD  senna-docusate (SENOKOT-S)  8.6-50 MG tablet Take 2 tablets by mouth 2 (two) times daily. 03/28/24   Krishnan, Gokul, MD  sitaGLIPtin  (JANUVIA ) 50 MG tablet Take 1 tablet (50 mg total) by mouth daily. 11/30/24 11/25/25  Purcell Emil Schanz, MD    Family History Family History  Problem Relation Age of Onset   Bronchitis Sister    Hypertension Sister    Stroke Sister    Hepatitis Daughter    Colon cancer Neg Hx    Esophageal cancer Neg Hx    Rectal cancer Neg Hx    Stomach cancer Neg Hx     Social History Social History[1]   Allergies   Porcine (pork) protein-containing drug products   Review of Systems Review of Systems   Physical Exam Triage Vital Signs ED Triage Vitals  Encounter Vitals Group     BP 12/08/24 1849 132/71     Girls Systolic BP Percentile --      Girls Diastolic BP Percentile --      Boys Systolic BP Percentile --      Boys Diastolic BP Percentile --      Pulse Rate 12/08/24 1849 83     Resp 12/08/24 1849 17     Temp 12/08/24 1849 98 F (36.7 C)     Temp Source 12/08/24 1849 Oral     SpO2 12/08/24 1849 (S) (!) 89 %     Weight --      Height --      Head Circumference --      Peak Flow --      Pain Score 12/08/24 1847 10     Pain Loc --      Pain Education --      Exclude from Growth Chart --    No data found.  Updated Vital Signs BP 132/71 (BP Location: Right Arm)   Pulse 83   Temp 98 F (36.7 C) (Oral)   Resp 17   LMP 11/02/2010 Comment: postmenopausal  SpO2 95%   Visual Acuity Right Eye Distance:   Left Eye Distance:   Bilateral Distance:    Right Eye Near:   Left Eye Near:    Bilateral Near:     Physical Exam Vitals reviewed.  Constitutional:      General: She is awake. She is not in acute distress.    Appearance: Normal appearance. She is well-developed and well-groomed. She is not ill-appearing, toxic-appearing or diaphoretic.  HENT:     Head: Normocephalic and atraumatic.  Eyes:     General: Lids are normal. Gaze aligned appropriately.      Extraocular  Movements: Extraocular movements intact.     Conjunctiva/sclera: Conjunctivae normal.  Pulmonary:     Effort: Pulmonary effort is normal.     Breath sounds: Decreased air movement present. No decreased breath sounds, wheezing, rhonchi or rales.  Abdominal:     Tenderness: There is left CVA tenderness.  Musculoskeletal:       Back:     Comments: Patient has tenderness along the left lumbar spine and hip.  She has pain with flexion of the left hip.  She also has left CVA tenderness.  Skin:    General: Skin is warm and dry.     Findings: No rash.  Neurological:     Mental Status: She is alert and oriented to person, place, and time.  Psychiatric:        Attention and Perception: Attention and perception normal.        Mood and Affect: Mood and affect normal.        Speech: Speech normal.        Behavior: Behavior normal. Behavior is cooperative.      UC Treatments / Results  Labs (all labs ordered are listed, but only abnormal results are displayed) Labs Reviewed  POCT URINE DIPSTICK - Abnormal; Notable for the following components:      Result Value   Color, UA light yellow (*)    Glucose, UA >=1,000 (*)    All other components within normal limits  GLUCOSE, POCT (MANUAL RESULT ENTRY) - Abnormal; Notable for the following components:   POC Glucose 102 (*)    All other components within normal limits    EKG   Radiology DG Hip Unilat With Pelvis 2-3 Views Left Result Date: 12/08/2024 CLINICAL DATA:  Acute left hip pain EXAM: DG HIP (WITH OR WITHOUT PELVIS) 2-3V LEFT COMPARISON:  06/28/2024, CT 03/25/2024 FINDINGS: Patent SI joints. Pubic symphysis appears intact. Threaded screw fixation of the left femur for prior femoral neck fracture. No acute fracture or malalignment is seen. IMPRESSION: 1. No acute osseous abnormality. 2. Postsurgical changes of the left femur. Electronically Signed   By: Luke Bun M.D.   On: 12/08/2024 19:41    Procedures Procedures  (including critical care time)  Medications Ordered in UC Medications  triamcinolone  acetonide (KENALOG -40) injection 40 mg (40 mg Intramuscular Given 12/08/24 2016)    Initial Impression / Assessment and Plan / UC Course  I have reviewed the triage vital signs and the nursing notes.  Pertinent labs & imaging results that were available during my care of the patient were reviewed by me and considered in my medical decision making (see chart for details).      Chart review of most recent 3 visits to other medical offices demonstrates oxygen saturation between 89 to 91%.  Believes that patient is currently at her baseline as she reports that she is not having respiratory distress, shortness of breath or DOE that is outside her normal.  Final Clinical Impressions(s) / UC Diagnoses   Final diagnoses:  Acute left-sided low back pain without sciatica  Left-sided pelvic pain  Glucosuria   Patient is here today with her son who is providing translation services.  They report that since yesterday patient has had severe, 10/10 pain along the left lower back and left side.  She denies dysuria, vaginal pain or bleeding, injury or trauma, bruising.  Physical exam does not reveal obvious evidence of bruising, rash, erythema.  There is tenderness to palpation along the left lower back radiating into the  superior aspect of the left hip.  She appears to have intact ROM at the hip and lumbar spine but does report some discomfort with flexion and extension.  Urine dip is negative for evidence of hematuria, infection.  Was notable for significant glucosuria but CBG was normal.  Suspect glucosuria is likely secondary to her Farxiga .  At this time I am unsure what is causing her pain.  Differential includes but is not limited to paresthesia, developing shingles, arthritis.  Imaging is negative for acute osseous injury or abnormality.  Will try a Kenalog  40 mg steroid injection to assist with potential inflammation  and hopefully relieve her pain.  Reviewed with patient and her son that if the pain is not improving or seems to be worsening, she has unilateral weakness or numbness, swelling or rash they should return here or follow-up at the ER.  They voiced agreement understanding with recommendations and management plan.  Recommend follow-up with PCP next week for further assessment and ongoing management.    Discharge Instructions      You were seen today for concerns of left-sided pain that has been ongoing since yesterday.  Your imaging has been reviewed by radiology and they do not see any evidence of an acute bony injury at this time.  Your urine did not show evidence of blood or infection.  At this time I am unsure what is causing your pain but to assist with potential inflammation we administered a Kenalog  40 mg steroid injection.  Please note that steroids can cause increased hunger, blood sugar spikes and increased irritability but this should start to improve with time.  I do recommend that you follow-up with your primary care provider next week to assess what might be causing your symptoms and to have further, ongoing management.  If you have any of the following symptoms please go immediately to the ER: More severe pain, inability to walk, numbness or weakness on one side of the body, fever.  If you start having abdominal pain or the pain starts to move to another area I would also recommend going to the ER for evaluation.     ED Prescriptions   None    PDMP not reviewed this encounter.     [1]  Social History Tobacco Use   Smoking status: Never   Smokeless tobacco: Never   Tobacco comments:    Chews cola nut  Vaping Use   Vaping status: Never Used  Substance Use Topics   Alcohol use: No    Alcohol/week: 0.0 standard drinks of alcohol   Drug use: No     [place] Doig, Rocky BRAVO, PA-C 12/08/24 2037  "
